# Patient Record
Sex: Female | Born: 1960 | Race: Black or African American | Hispanic: No | Smoking: Former smoker
Health system: Southern US, Community
[De-identification: ages and names within clinical notes are randomized; demographics above are authoritative.]

## PROBLEM LIST (undated history)

## (undated) DIAGNOSIS — K219 Gastro-esophageal reflux disease without esophagitis: Secondary | ICD-10-CM

## (undated) DIAGNOSIS — E7800 Pure hypercholesterolemia, unspecified: Secondary | ICD-10-CM

## (undated) DIAGNOSIS — J45909 Unspecified asthma, uncomplicated: Secondary | ICD-10-CM

## (undated) HISTORY — PX: APPENDECTOMY: SHX54

## (undated) HISTORY — PX: ABDOMINAL HYSTERECTOMY: SHX81

## (undated) HISTORY — PX: EYE SURGERY: SHX253

## (undated) HISTORY — PX: ANKLE SURGERY: SHX546

---

## 2013-01-22 ENCOUNTER — Encounter (HOSPITAL_COMMUNITY): Payer: Self-pay | Admitting: Emergency Medicine

## 2013-01-22 ENCOUNTER — Emergency Department (HOSPITAL_COMMUNITY)

## 2013-01-22 ENCOUNTER — Emergency Department (HOSPITAL_COMMUNITY)
Admission: EM | Admit: 2013-01-22 | Discharge: 2013-01-22 | Disposition: A | Discharge status: Home / Self Care | Attending: Emergency Medicine | Admitting: Emergency Medicine

## 2013-01-22 DIAGNOSIS — R110 Nausea: Secondary | ICD-10-CM | POA: Insufficient documentation

## 2013-01-22 DIAGNOSIS — IMO0002 Reserved for concepts with insufficient information to code with codable children: Secondary | ICD-10-CM | POA: Insufficient documentation

## 2013-01-22 DIAGNOSIS — J189 Pneumonia, unspecified organism: Secondary | ICD-10-CM

## 2013-01-22 DIAGNOSIS — Z87891 Personal history of nicotine dependence: Secondary | ICD-10-CM | POA: Insufficient documentation

## 2013-01-22 DIAGNOSIS — R5381 Other malaise: Secondary | ICD-10-CM | POA: Insufficient documentation

## 2013-01-22 DIAGNOSIS — Z880 Allergy status to penicillin: Secondary | ICD-10-CM | POA: Insufficient documentation

## 2013-01-22 DIAGNOSIS — R509 Fever, unspecified: Secondary | ICD-10-CM | POA: Insufficient documentation

## 2013-01-22 DIAGNOSIS — R55 Syncope and collapse: Secondary | ICD-10-CM

## 2013-01-22 DIAGNOSIS — Z79899 Other long term (current) drug therapy: Secondary | ICD-10-CM | POA: Insufficient documentation

## 2013-01-22 DIAGNOSIS — E7800 Pure hypercholesterolemia, unspecified: Secondary | ICD-10-CM | POA: Insufficient documentation

## 2013-01-22 DIAGNOSIS — J159 Unspecified bacterial pneumonia: Secondary | ICD-10-CM | POA: Insufficient documentation

## 2013-01-22 DIAGNOSIS — IMO0001 Reserved for inherently not codable concepts without codable children: Secondary | ICD-10-CM | POA: Insufficient documentation

## 2013-01-22 DIAGNOSIS — J45909 Unspecified asthma, uncomplicated: Secondary | ICD-10-CM | POA: Insufficient documentation

## 2013-01-22 HISTORY — DX: Pure hypercholesterolemia, unspecified: E78.00

## 2013-01-22 HISTORY — DX: Gastro-esophageal reflux disease without esophagitis: K21.9

## 2013-01-22 HISTORY — DX: Unspecified asthma, uncomplicated: J45.909

## 2013-01-22 LAB — CBC WITH DIFFERENTIAL/PLATELET
Basophils Absolute: 0 K/uL (ref 0.0–0.1)
Basophils Absolute: 0 K/uL (ref 0.0–0.1)
Basophils Relative: 0 % (ref 0–1)
Basophils Relative: 0 % (ref 0–1)
Eosinophils Absolute: 0 K/uL (ref 0.0–0.7)
Eosinophils Absolute: 0 10*3/uL (ref 0.0–0.7)
Eosinophils Relative: 0 % (ref 0–5)
Eosinophils Relative: 0 % (ref 0–5)
HCT: 40.2 % (ref 36.0–46.0)
HCT: 34.9 % — ABNORMAL LOW (ref 36.0–46.0)
Hemoglobin: 14 g/dL (ref 12.0–15.0)
Hemoglobin: 12.2 g/dL (ref 12.0–15.0)
Lymphocytes Relative: 5 % — ABNORMAL LOW (ref 12–46)
Lymphocytes Relative: 4 % — ABNORMAL LOW (ref 12–46)
Lymphs Abs: 0.9 10*3/uL (ref 0.7–4.0)
Lymphs Abs: 0.7 10*3/uL (ref 0.7–4.0)
MCH: 30.7 pg (ref 26.0–34.0)
MCH: 30.3 pg (ref 26.0–34.0)
MCHC: 34.8 g/dL (ref 30.0–36.0)
MCHC: 35 g/dL (ref 30.0–36.0)
MCV: 88.2 fL (ref 78.0–100.0)
MCV: 86.6 fL (ref 78.0–100.0)
Monocytes Absolute: 0.9 10*3/uL (ref 0.1–1.0)
Monocytes Absolute: 1.4 10*3/uL — ABNORMAL HIGH (ref 0.1–1.0)
Monocytes Relative: 5 % (ref 3–12)
Monocytes Relative: 8 % (ref 3–12)
Neutro Abs: 16.1 10*3/uL — ABNORMAL HIGH (ref 1.7–7.7)
Neutro Abs: 15.7 K/uL — ABNORMAL HIGH (ref 1.7–7.7)
Neutrophils Relative %: 90 % — ABNORMAL HIGH (ref 43–77)
Neutrophils Relative %: 88 % — ABNORMAL HIGH (ref 43–77)
Platelets: 245 10*3/uL (ref 150–400)
Platelets: 198 K/uL (ref 150–400)
RBC: 4.56 MIL/uL (ref 3.87–5.11)
RBC: 4.03 MIL/uL (ref 3.87–5.11)
RDW: 12.1 % (ref 11.5–15.5)
RDW: 12 % (ref 11.5–15.5)
WBC Morphology: INCREASED
WBC: 17.9 10*3/uL — ABNORMAL HIGH (ref 4.0–10.5)
WBC: 17.8 10*3/uL — ABNORMAL HIGH (ref 4.0–10.5)

## 2013-01-22 LAB — CG4 I-STAT (LACTIC ACID): Lactic Acid, Venous: 1.82 mmol/L (ref 0.5–2.2)

## 2013-01-22 LAB — URINALYSIS, ROUTINE W REFLEX MICROSCOPIC
Bilirubin Urine: NEGATIVE
Glucose, UA: NEGATIVE mg/dL
Hgb urine dipstick: NEGATIVE
Ketones, ur: 15 mg/dL — AB
Nitrite: NEGATIVE
Protein, ur: NEGATIVE mg/dL
Specific Gravity, Urine: 1.011 (ref 1.005–1.030)
Urobilinogen, UA: 1 mg/dL (ref 0.0–1.0)
pH: 6.5 (ref 5.0–8.0)

## 2013-01-22 LAB — BASIC METABOLIC PANEL
BUN: 14 mg/dL (ref 6–23)
CO2: 25 mEq/L (ref 19–32)
Calcium: 9.2 mg/dL (ref 8.4–10.5)
Chloride: 94 mEq/L — ABNORMAL LOW (ref 96–112)
Creatinine, Ser: 0.88 mg/dL (ref 0.50–1.10)
Sodium: 131 mEq/L — ABNORMAL LOW (ref 135–145)

## 2013-01-22 LAB — BASIC METABOLIC PANEL WITH GFR
GFR calc Af Amer: 85 mL/min — ABNORMAL LOW (ref >90)
GFR calc non Af Amer: 73 mL/min — ABNORMAL LOW (ref >90)
Glucose, Bld: 160 mg/dL — ABNORMAL HIGH (ref 70–99)
Potassium: 3.2 meq/L — ABNORMAL LOW (ref 3.5–5.1)

## 2013-01-22 LAB — URINE MICROSCOPIC-ADD ON

## 2013-01-22 MED ORDER — ALBUTEROL SULFATE (5 MG/ML) 0.5% IN NEBU
5.0000 mg | INHALATION_SOLUTION | Freq: Once | RESPIRATORY_TRACT | Status: AC
  Administered 2013-01-22: 5 mg via RESPIRATORY_TRACT
  Filled 2013-01-22: qty 1

## 2013-01-22 MED ORDER — LEVOFLOXACIN IN D5W 750 MG/150ML IV SOLN
750.0000 mg | Freq: Once | INTRAVENOUS | Status: AC
  Administered 2013-01-22: 750 mg via INTRAVENOUS
  Filled 2013-01-22: qty 150

## 2013-01-22 MED ORDER — ONDANSETRON HCL 4 MG/2ML IJ SOLN
4.0000 mg | Freq: Once | INTRAMUSCULAR | Status: AC
  Administered 2013-01-22: 4 mg via INTRAVENOUS
  Filled 2013-01-22: qty 2

## 2013-01-22 MED ORDER — ONDANSETRON HCL 4 MG PO TABS: 4.0000 mg | ORAL_TABLET | Freq: Three times a day (TID) | ORAL | Status: DC | PRN

## 2013-01-22 MED ORDER — KETOROLAC TROMETHAMINE 30 MG/ML IJ SOLN
30.0000 mg | Freq: Once | INTRAMUSCULAR | Status: AC
  Administered 2013-01-22: 30 mg via INTRAVENOUS
  Filled 2013-01-22: qty 1

## 2013-01-22 MED ORDER — IBUPROFEN 200 MG PO TABS: 600.0000 mg | ORAL_TABLET | Freq: Once | ORAL | Status: DC

## 2013-01-22 MED ORDER — SODIUM CHLORIDE 0.9 % IV BOLUS (SEPSIS)
1000.0000 mL | Freq: Once | INTRAVENOUS | Status: AC
  Administered 2013-01-22: 1000 mL via INTRAVENOUS

## 2013-01-22 MED ORDER — LEVOFLOXACIN 500 MG PO TABS: 500.0000 mg | ORAL_TABLET | Freq: Every day | ORAL | Status: DC

## 2013-01-22 MED ORDER — IPRATROPIUM BROMIDE 0.02 % IN SOLN
0.5000 mg | Freq: Once | RESPIRATORY_TRACT | Status: AC
  Administered 2013-01-22: 0.5 mg via RESPIRATORY_TRACT
  Filled 2013-01-22: qty 2.5

## 2013-01-22 MED ORDER — ALBUTEROL SULFATE HFA 108 (90 BASE) MCG/ACT IN AERS: 1.0000 | INHALATION_SPRAY | Freq: Four times a day (QID) | RESPIRATORY_TRACT | Status: DC | PRN

## 2013-01-22 NOTE — ED Notes (Signed)
Pt states she has had a fever the past two days.

## 2013-01-22 NOTE — ED Notes (Signed)
Pt from home, c/o generalized weakness,fever and syncope. Pt states she was traveling yesterday and passed out in car, states she woke up incontinent. Pt blacked out at triage. Pt alert and oriented. NSD

## 2013-01-22 NOTE — ED Provider Notes (Signed)
CSN: [id number]     Arrival date & time 01/22/13  1834 History   First MD Initiated Contact with Patient 01/22/13 1852     Chief Complaint  Patient presents with  . 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  Pt was in car with son driving when syncope occurred for second time today.  Pt felt weak, no CP, HA, back pain.  Did have some abdominal cramping lower, that has resolved.  Pt also was incontinent of urine which family was concerned about.    Patient is a 52 y.o. female presenting with syncope and cough. The history is provided by the patient and a relative.  Loss of Consciousness Episode history:  Multiple Most recent episode:  Today Duration:  5 minutes Progression:  Waxing and waning Chronicity:  New Context: sitting down   Context comment:  URI, nausea, coughing, fever Witnessed: yes   Relieved by:  Lying down Worsened by:  Nothing tried Ineffective treatments:  None tried Associated symptoms: fever, nausea and weakness   Associated symptoms: no chest pain, no difficulty breathing, no dizziness, no headaches, no rectal bleeding and no vomiting   Fever:    Duration:  2 days   Timing:  Constant   Temp source:  Subjective and oral   Progression:  Unchanged Nausea:    Severity:  Mild   Onset quality:  Gradual   Duration:  1 day   Progression:  Waxing and waning Risk factors: no coronary artery disease, no seizures and no vascular disease   Cough Cough characteristics:  Productive Sputum characteristics:  Green Severity:  Moderate Duration:  3 days Timing:  Constant Progression:  Unchanged Chronicity:  New Smoker: no   Context: sick contacts   Ineffective treatments:  Beta-agonist inhaler Associated symptoms: fever and myalgias   Associated symptoms: no chest pain, no headaches  and no rash     Past Medical History  Diagnosis Date  . High cholesterol   . Asthma   . GERD (gastroesophageal reflux disease)    Past Surgical History  Procedure Laterality Date  . Eye surgery    . Abdominal hysterectomy    . Appendectomy     History reviewed. No pertinent family history. History  Substance Use Topics  . Smoking status: Former Games developer  . Smokeless tobacco: Not on file  . Alcohol Use: No   OB History   Grav Para Term Preterm Abortions TAB SAB Ect Mult Living                 Review of Systems  Constitutional: Positive for fever, appetite change and fatigue.  Respiratory: Positive for cough.   Cardiovascular: Positive for syncope. Negative for chest pain.  Gastrointestinal: Positive for nausea. Negative for vomiting, diarrhea, constipation and blood in stool.  Musculoskeletal: Positive for myalgias.  Skin: Negative for rash.  Neurological: Positive for syncope and weakness. Negative for dizziness and headaches.  All other systems reviewed and are negative.    Allergies  Ampicillin; Ceclor; Tetracyclines & related; and Tylenol with codeine #3  Home Medications   Current Outpatient Rx  Name  Route  Sig  Dispense  Refill  . albuterol (PROVENTIL HFA;VENTOLIN HFA) 108 (90 BASE) MCG/ACT inhaler   Inhalation   Inhale 2 puffs into the lungs every 6 (six) hours as needed for wheezing or shortness of breath.         Marland Kitchen  atorvastatin (LIPITOR) 10 MG tablet   Oral   Take 10 mg by mouth daily.         . cetirizine (ZYRTEC) 10 MG tablet   Oral   Take 10 mg by mouth daily.         . cyclobenzaprine (FLEXERIL) 5 MG tablet   Oral   Take 5 mg by mouth 3 (three) times daily as needed for muscle spasms.         Marland Kitchen esomeprazole (NEXIUM) 40 MG capsule   Oral   Take 40 mg by mouth daily at 12 noon.         . fluticasone (FLONASE) 50 MCG/ACT nasal spray   Each Nare   Place 1 spray into both nostrils daily.         . Fluticasone-Salmeterol (ADVAIR)  250-50 MCG/DOSE AEPB   Inhalation   Inhale 1 puff into the lungs 2 (two) times daily.         . Multiple Vitamins-Minerals (MULTIVITAMIN WITH MINERALS) tablet   Oral   Take 1 tablet by mouth daily.         . vitamin E 200 UNIT capsule   Oral   Take 200 Units by mouth daily.         Marland Kitchen albuterol (PROVENTIL HFA;VENTOLIN HFA) 108 (90 BASE) MCG/ACT inhaler   Inhalation   Inhale 1-2 puffs into the lungs every 6 (six) hours as needed for wheezing or shortness of breath.   1 Inhaler   0   . levofloxacin (LEVAQUIN) 500 MG tablet   Oral   Take 1 tablet (500 mg total) by mouth daily.   10 tablet   0    BP 103/60  Pulse 136  Temp(Src) 98.6 F (37 C) (Oral)  Resp 22  Wt 111 lb 5 oz (50.491 kg)  SpO2 98% Physical Exam  Nursing note and vitals reviewed. Constitutional: She appears well-developed and well-nourished. No distress.  HENT:  Head: Normocephalic and atraumatic.  Eyes: Conjunctivae and EOM are normal. No scleral icterus.  Neck: Normal range of motion. Neck supple.  Cardiovascular: Regular rhythm and intact distal pulses.  Tachycardia present.   No murmur heard. Pulmonary/Chest: Effort normal. No respiratory distress. She has no wheezes. She has no rales.  Abdominal: Soft. She exhibits no distension. There is no tenderness. There is no rebound and no guarding.  Neurological: She is alert.  Skin: Skin is warm and dry. No rash noted. She is not diaphoretic.  Psychiatric: She has a normal mood and affect.    ED Course  Procedures (including critical care time) Labs Review Labs Reviewed  CBC WITH DIFFERENTIAL - Abnormal; Notable for the following:    WBC 17.9 (*)    Neutrophils Relative % 90 (*)    Lymphocytes Relative 5 (*)    Neutro Abs 16.1 (*)    All other components within normal limits  BASIC METABOLIC PANEL - Abnormal; Notable for the following:    Sodium 131 (*)    Potassium 3.2 (*)    Chloride 94 (*)    Glucose, Bld 160 (*)    GFR calc non Af Amer 73  (*)    GFR calc Af Amer 85 (*)    All other components within normal limits  URINALYSIS, ROUTINE W REFLEX MICROSCOPIC - Abnormal; Notable for the following:    APPearance HAZY (*)    Ketones, ur 15 (*)    Leukocytes, UA LARGE (*)    All other components within normal  limits  CBC WITH DIFFERENTIAL - Abnormal; Notable for the following:    WBC 17.8 (*)    HCT 34.9 (*)    Neutrophils Relative % 88 (*)    Neutro Abs 15.7 (*)    Lymphocytes Relative 4 (*)    Monocytes Absolute 1.4 (*)    All other components within normal limits  URINE MICROSCOPIC-ADD ON - Abnormal; Notable for the following:    Squamous Epithelial / LPF MANY (*)    Bacteria, UA FEW (*)    All other components within normal limits  URINE CULTURE  CG4 I-STAT (LACTIC ACID)   Imaging Review Dg Chest 2 View  01/22/2013   CLINICAL DATA:  Cough, fever, chest congestion  EXAM: CHEST  2 VIEW  COMPARISON:  None.  FINDINGS: Focal right upper lobe opacity, suspicious for pneumonia. No pleural effusion or pneumothorax.  The heart is normal in size.  Visualized osseous structures are within normal limits.  IMPRESSION: Focal right upper lobe opacity, suspicious for pneumonia.  Follow-up chest radiographs are suggested to document resolution.   Electronically Signed   By: Charline Bills M.D.   On: 01/22/2013 19:58    EKG Interpretation    Date/Time:  Friday January 22 2013 18:50:21 EST Ventricular Rate:  106 PR Interval:  183 QRS Duration: 72 QT Interval:  303 QTC Calculation: 402 R Axis:   74 Text Interpretation:  Sinus tachycardia Nonspecific repol abnormality, diffuse leads Borderline ECG No previous tracing Confirmed by [facility]  MD, MICHEAL ([id number]) on 01/22/2013 7:17:44 PM           ra sat is 99% and I interpret to be normal  8:20 PM Mild electrolytes derangements, likely due to dehydration.     8:30 PM I reviewed CXR and radiologist interpretation, agree that there is a RUL opacity, consistent with CAP.  Given  pt has had 1 dose of azithromycin, but no sig improvement, in fact seems worse, will give IV dose of levaquin, give IVF's and monitor carefully as pt may require admission.      10:40 PM Pt feels improved overall.  Pt given optino for admission given continued sinus tachycardia, although improved.  Pt has help at home, resources with family, pt prefers to go home.  Pt has inhaler at [facility] as well.  Will change abx to   MDM   1. Community acquired pneumonia   2. Syncope      Pt with ILI with fever, achiness, coughing, weakness.  Pt has not been vaccinated and at risk given contacts with multiple children.  Will get CXR, fever work up including lactic acid.  Otherwise will treat fever, give IVF's and continue to monitor.  Pt has no cardiovascular risks, no prior h/o seizures.  Pt is mildly orthostatic with drop in systolic and rise in HR with standing.      Gavin Pound. Danniell Rotundo, MD 01/22/13 2240

## 2013-01-22 NOTE — Discharge Instructions (Signed)
Pneumonia, Adult  Pneumonia is an infection of the lungs.   CAUSES  Pneumonia may be caused by bacteria or a virus. Usually, these infections are caused by breathing infectious particles into the lungs (respiratory tract).  SYMPTOMS    Cough.   Fever.   Chest pain.   Increased rate of breathing.   Wheezing.   Mucus production.  DIAGNOSIS   If you have the common symptoms of pneumonia, your caregiver will typically confirm the diagnosis with a chest X-ray. The X-ray will show an abnormality in the lung (pulmonary infiltrate) if you have pneumonia. Other tests of your blood, urine, or sputum may be done to find the specific cause of your pneumonia. Your caregiver may also do tests (blood gases or pulse oximetry) to see how well your lungs are working.  TREATMENT   Some forms of pneumonia may be spread to other people when you cough or sneeze. You may be asked to wear a mask before and during your exam. Pneumonia that is caused by bacteria is treated with antibiotic medicine. Pneumonia that is caused by the influenza virus may be treated with an antiviral medicine. Most other viral infections must run their course. These infections will not respond to antibiotics.   PREVENTION  A pneumococcal shot (vaccine) is available to prevent a common bacterial cause of pneumonia. This is usually suggested for:   People over 65 years old.   Patients on chemotherapy.   People with chronic lung problems, such as bronchitis or emphysema.   People with immune system problems.  If you are over 65 or have a high risk condition, you may receive the pneumococcal vaccine if you have not received it before. In some countries, a routine influenza vaccine is also recommended. This vaccine can help prevent some cases of pneumonia.You may be offered the influenza vaccine as part of your care.  If you smoke, it is time to quit. You may receive instructions on how to stop smoking. Your caregiver can provide medicines and counseling to  help you quit.  HOME CARE INSTRUCTIONS    Cough suppressants may be used if you are losing too much rest. However, coughing protects you by clearing your lungs. You should avoid using cough suppressants if you can.   Your caregiver may have prescribed medicine if he or she thinks your pneumonia is caused by a bacteria or influenza. Finish your medicine even if you start to feel better.   Your caregiver may also prescribe an expectorant. This loosens the mucus to be coughed up.   Only take over-the-counter or prescription medicines for pain, discomfort, or fever as directed by your caregiver.   Do not smoke. Smoking is a common cause of bronchitis and can contribute to pneumonia. If you are a smoker and continue to smoke, your cough may last several weeks after your pneumonia has cleared.   A cold steam vaporizer or humidifier in your room or home may help loosen mucus.   Coughing is often worse at night. Sleeping in a semi-upright position in a recliner or using a couple pillows under your head will help with this.   Get rest as you feel it is needed. Your body will usually let you know when you need to rest.  SEEK IMMEDIATE MEDICAL CARE IF:    Your illness becomes worse. This is especially true if you are elderly or weakened from any other disease.   You cannot control your cough with suppressants and are losing sleep.     You begin coughing up blood.   You develop pain which is getting worse or is uncontrolled with medicines.   You have a fever.   Any of the symptoms which initially brought you in for treatment are getting worse rather than better.   You develop shortness of breath or chest pain.  MAKE SURE YOU:    Understand these instructions.   Will watch your condition.   Will get help right away if you are not doing well or get worse.  Document Released: 02/11/2005 Document Revised: 05/06/2011 Document Reviewed: 05/03/2010  [facility] Patient Information 2014 [place]  [place]          Syncope  Syncope is a fainting spell. This means the person loses consciousness and drops to the ground. The person is generally unconscious for less than 5 minutes. The person may have some muscle twitches for up to 15 seconds before waking up and returning to normal. Syncope occurs more often in elderly people, but it can happen to anyone. While most causes of syncope are not dangerous, syncope can be a sign of a serious medical problem. It is important to seek medical care.   CAUSES   Syncope is caused by a sudden decrease in blood flow to the brain. The specific cause is often not determined. Factors that can trigger syncope include:   Taking medicines that lower blood pressure.   Sudden changes in posture, such as standing up suddenly.   Taking more medicine than prescribed.   Standing in one place for too long.   Seizure disorders.   Dehydration and excessive exposure to heat.   Low blood sugar (hypoglycemia).   Straining to have a bowel movement.   Heart disease, irregular heartbeat, or other circulatory problems.   Fear, emotional distress, seeing blood, or severe pain.  SYMPTOMS   Right before fainting, you may:   Feel dizzy or lightheaded.   Feel nauseous.   See all white or all black in your field of vision.   Have cold, clammy skin.  DIAGNOSIS   Your caregiver will ask about your symptoms, perform a physical exam, and perform electrocardiography (ECG) to record the electrical activity of your heart. Your caregiver may also perform other heart or blood tests to determine the cause of your syncope.  TREATMENT   In most cases, no treatment is needed. Depending on the cause of your syncope, your caregiver may recommend changing or stopping some of your medicines.  HOME CARE INSTRUCTIONS   Have someone stay with you until you feel stable.   Do not drive, operate machinery, or play sports until your caregiver says it is okay.   Keep all follow-up appointments as directed by your  caregiver.   Lie down right away if you start feeling like you might faint. Breathe deeply and steadily. Wait until all the symptoms have passed.   Drink enough fluids to keep your urine clear or pale yellow.   If you are taking blood pressure or heart medicine, get up slowly, taking several minutes to sit and then stand. This can reduce dizziness.  SEEK IMMEDIATE MEDICAL CARE IF:    You have a severe headache.   You have unusual pain in the chest, abdomen, or back.   You are bleeding from the mouth or rectum, or you have black or tarry stool.   You have an irregular or very fast heartbeat.   You have pain with breathing.   You have repeated fainting or seizure-like jerking during an  episode.   You faint when sitting or lying down.   You have confusion.   You have difficulty walking.   You have severe weakness.   You have vision problems.  If you fainted, call your local emergency services (911 in [place] Do not drive yourself to the hospital.   MAKE SURE YOU:   Understand these instructions.   Will watch your condition.   Will get help right away if you are not doing well or get worse.  Document Released: 02/11/2005 Document Revised: 08/13/2011 Document Reviewed: 04/12/2011  [facility] Patient Information 2014 [place].

## 2013-01-22 NOTE — ED Notes (Signed)
Charge RN informed of pt's labwork, lab initial informed this RN that they did not have the CBC with diff in the laboratory, labwork redrawn. Duplicate labwork resulted in pt's chart. Asked charge RN how to remove one input so the pt is not double charge. Charge RN said he would look into it.

## 2013-01-24 LAB — URINE CULTURE: Colony Count: 8000

## 2013-08-16 ENCOUNTER — Emergency Department (HOSPITAL_COMMUNITY)
Admission: EM | Admit: 2013-08-16 | Discharge: 2013-08-16 | Disposition: A | Discharge status: Home / Self Care | Attending: Emergency Medicine | Admitting: Emergency Medicine

## 2013-08-16 ENCOUNTER — Encounter (HOSPITAL_COMMUNITY): Payer: Self-pay | Admitting: Emergency Medicine

## 2013-08-16 ENCOUNTER — Emergency Department (HOSPITAL_COMMUNITY)

## 2013-08-16 DIAGNOSIS — IMO0002 Reserved for concepts with insufficient information to code with codable children: Secondary | ICD-10-CM | POA: Insufficient documentation

## 2013-08-16 DIAGNOSIS — Z3202 Encounter for pregnancy test, result negative: Secondary | ICD-10-CM | POA: Insufficient documentation

## 2013-08-16 DIAGNOSIS — R05 Cough: Secondary | ICD-10-CM

## 2013-08-16 DIAGNOSIS — R55 Syncope and collapse: Secondary | ICD-10-CM | POA: Insufficient documentation

## 2013-08-16 DIAGNOSIS — Z79899 Other long term (current) drug therapy: Secondary | ICD-10-CM | POA: Insufficient documentation

## 2013-08-16 DIAGNOSIS — Z880 Allergy status to penicillin: Secondary | ICD-10-CM | POA: Insufficient documentation

## 2013-08-16 DIAGNOSIS — J45909 Unspecified asthma, uncomplicated: Secondary | ICD-10-CM | POA: Insufficient documentation

## 2013-08-16 DIAGNOSIS — R059 Cough, unspecified: Secondary | ICD-10-CM

## 2013-08-16 DIAGNOSIS — E7800 Pure hypercholesterolemia, unspecified: Secondary | ICD-10-CM | POA: Insufficient documentation

## 2013-08-16 DIAGNOSIS — K219 Gastro-esophageal reflux disease without esophagitis: Secondary | ICD-10-CM | POA: Insufficient documentation

## 2013-08-16 LAB — URINALYSIS, ROUTINE W REFLEX MICROSCOPIC
Bilirubin Urine: NEGATIVE
Glucose, UA: NEGATIVE mg/dL
KETONES UR: 15 mg/dL — AB
Nitrite: NEGATIVE
PH: 7.5 (ref 5.0–8.0)
Protein, ur: NEGATIVE mg/dL
SPECIFIC GRAVITY, URINE: 1.006 (ref 1.005–1.030)
Urobilinogen, UA: 1 mg/dL (ref 0.0–1.0)

## 2013-08-16 LAB — COMPREHENSIVE METABOLIC PANEL
ALBUMIN: 4.1 g/dL (ref 3.5–5.2)
ALT: 19 U/L (ref 0–35)
AST: 24 U/L (ref 0–37)
Alkaline Phosphatase: 84 U/L (ref 39–117)
BUN: 9 mg/dL (ref 6–23)
CHLORIDE: 96 meq/L (ref 96–112)
CO2: 24 mEq/L (ref 19–32)
Calcium: 9.7 mg/dL (ref 8.4–10.5)
Creatinine, Ser: 0.66 mg/dL (ref 0.50–1.10)
GFR calc non Af Amer: >90 mL/min (ref >90)
GLUCOSE: 123 mg/dL — AB (ref 70–99)
Potassium: 3.5 mEq/L — ABNORMAL LOW (ref 3.7–5.3)
Sodium: 135 mEq/L — ABNORMAL LOW (ref 137–147)
TOTAL PROTEIN: 7.1 g/dL (ref 6.0–8.3)
Total Bilirubin: 0.7 mg/dL (ref 0.3–1.2)

## 2013-08-16 LAB — CBC WITH DIFFERENTIAL/PLATELET
Basophils Absolute: 0 10*3/uL (ref 0.0–0.1)
Basophils Relative: 0 % (ref 0–1)
EOS ABS: 0 10*3/uL (ref 0.0–0.7)
Eosinophils Relative: 0 % (ref 0–5)
HCT: 35.6 % — ABNORMAL LOW (ref 36.0–46.0)
HEMOGLOBIN: 12 g/dL (ref 12.0–15.0)
LYMPHS ABS: 0.8 10*3/uL (ref 0.7–4.0)
Lymphocytes Relative: 8 % — ABNORMAL LOW (ref 12–46)
MCH: 28.9 pg (ref 26.0–34.0)
MCHC: 33.7 g/dL (ref 30.0–36.0)
MCV: 85.8 fL (ref 78.0–100.0)
MONOS PCT: 7 % (ref 3–12)
Monocytes Absolute: 0.7 10*3/uL (ref 0.1–1.0)
NEUTROS ABS: 8.3 10*3/uL — AB (ref 1.7–7.7)
Neutrophils Relative %: 85 % — ABNORMAL HIGH (ref 43–77)
PLATELETS: 248 10*3/uL (ref 150–400)
RBC: 4.15 MIL/uL (ref 3.87–5.11)
RDW: 12.2 % (ref 11.5–15.5)
WBC: 9.8 10*3/uL (ref 4.0–10.5)

## 2013-08-16 LAB — CBG MONITORING, ED: Glucose-Capillary: 129 mg/dL — ABNORMAL HIGH (ref 70–99)

## 2013-08-16 LAB — URINE MICROSCOPIC-ADD ON

## 2013-08-16 LAB — TROPONIN I

## 2013-08-16 MED ORDER — LEVOFLOXACIN 500 MG PO TABS: 500.0000 mg | ORAL_TABLET | Freq: Every day | ORAL | Status: AC

## 2013-08-16 MED ORDER — LEVOFLOXACIN 500 MG PO TABS
500.0000 mg | ORAL_TABLET | Freq: Once | ORAL | Status: AC
  Administered 2013-08-16: 500 mg via ORAL
  Filled 2013-08-16: qty 1

## 2013-08-16 MED ORDER — SODIUM CHLORIDE 0.9 % IV SOLN
1000.0000 mL | INTRAVENOUS | Status: DC
  Administered 2013-08-16: 1000 mL via INTRAVENOUS

## 2013-08-16 NOTE — ED Notes (Signed)
Main lab staff called back and stated urine sample located. Currently processing.

## 2013-08-16 NOTE — Discharge Instructions (Signed)
As discussed, with your cough, fever, and congestion, there is some concern for ongoing URI and/or early pneumonia.  You will be started on antibiotics.  Please take all medication as directed.  Additionally, with your episodes of losing consciousness, while you have respiratory infection, it is important to follow up with our cardiologists as well.  Be sure to return here for any concerning changes in your condition.

## 2013-08-16 NOTE — ED Provider Notes (Signed)
CSN: [id number]     Arrival date & time 08/16/13  1829 History   First MD Initiated Contact with Patient 08/16/13 1837     Chief Complaint  Patient presents with  . Loss of Consciousness     (Consider location/radiation/quality/duration/timing/severity/associated sxs/prior Treatment) HPI Patient presents today after an episode of losing consciousness. Patient notes that approximately one week ago she began having URI like symptoms, with productive cough, chest tightness, fevers, chills. Symptoms have been persistent, and she went to urgent care today. There she was found to have a syncopal episode. Patient notes upon awakening she continued to have the same chest discomfort, no new confusion, disorientation, nausea, vomiting. Patient's history of multiple prior syncopal events, typically with URI illnesses. Patient does not smoke, does not drink, is generally well.  Past Medical History  Diagnosis Date  . High cholesterol   . Asthma   . GERD (gastroesophageal reflux disease)    Past Surgical History  Procedure Laterality Date  . Eye surgery    . Abdominal hysterectomy    . Appendectomy    . Ankle surgery     No family history on file. History  Substance Use Topics  . Smoking status: Former Games developer  . Smokeless tobacco: Not on file  . Alcohol Use: No   OB History   Grav Para Term Preterm Abortions TAB SAB Ect Mult Living                 Review of Systems  Constitutional:       Per HPI, otherwise negative  HENT:       Per HPI, otherwise negative  Respiratory:       Per HPI, otherwise negative  Cardiovascular:       Per HPI, otherwise negative  Gastrointestinal: Negative for vomiting.  Endocrine:       Negative aside from HPI  Genitourinary:       Neg aside from HPI   Musculoskeletal:       Per HPI, otherwise negative  Skin: Negative.   Neurological: Positive for syncope.      Allergies  Ampicillin; Ceclor; Sulfa antibiotics; Tape; Tetracyclines &  related; and Tylenol with codeine #3  Home Medications   Prior to Admission medications   Medication Sig Start Date End Date Taking? Authorizing Provider  Acetaminophen (TYLENOL PO) Take 2 tablets by mouth daily as needed (for headache).   Yes Historical Provider, MD  albuterol (PROVENTIL HFA;VENTOLIN HFA) 108 (90 BASE) MCG/ACT inhaler Inhale 2 puffs into the lungs every 6 (six) hours as needed for wheezing or shortness of breath.   Yes Historical Provider, MD  Ascorbic Acid (VITAMIN C GUMMIE PO) Take 2 tablets by mouth daily.   Yes Historical Provider, MD  atorvastatin (LIPITOR) 10 MG tablet Take 10 mg by mouth daily at 6 PM.    Yes Historical Provider, MD  cetirizine (ZYRTEC) 10 MG tablet Take 10 mg by mouth at bedtime.    Yes Historical Provider, MD  Cholecalciferol (VITAMIN D) 1000 UNITS capsule Take 1,000 Units by mouth daily.   Yes Historical Provider, MD  cyclobenzaprine (FLEXERIL) 5 MG tablet Take 5 mg by mouth 3 (three) times daily as needed for muscle spasms.   Yes Historical Provider, MD  esomeprazole (NEXIUM) 40 MG capsule Take 40 mg by mouth daily at 12 noon.   Yes Historical Provider, MD  fluticasone (FLONASE) 50 MCG/ACT nasal spray Place 1 spray into both nostrils daily.   Yes Historical Provider, MD  Fluticasone-Salmeterol (ADVAIR)  250-50 MCG/DOSE AEPB Inhale 1 puff into the lungs 2 (two) times daily.   Yes Historical Provider, MD  hydrocortisone valerate cream (WESTCORT) 0.2 % Apply 1 application topically 2 (two) times daily as needed (for dry skin).   Yes Historical Provider, MD  Multiple Vitamins-Minerals (MULTIVITAMIN WITH MINERALS) tablet Take 1 tablet by mouth daily.   Yes Historical Provider, MD  vitamin E 200 UNIT capsule Take 200 Units by mouth daily.   Yes Historical Provider, MD   BP 110/67  Pulse 96  Temp(Src) 100.3 F (37.9 C) (Oral)  Resp 23  Ht 4\' 9"  (1.448 m)  Wt 103 lb (46.72 kg)  BMI 22.28 kg/m2  SpO2 99% Physical Exam  Nursing note and vitals  reviewed. Constitutional: She is oriented to person, place, and time. She appears well-developed and well-nourished. No distress.  HENT:  Head: Normocephalic and atraumatic.  Eyes: Conjunctivae and EOM are normal.  Cardiovascular: Normal rate and regular rhythm.   Pulmonary/Chest: Effort normal and breath sounds normal. No stridor. No respiratory distress.  Abdominal: She exhibits no distension.  Musculoskeletal: She exhibits no edema.  Neurological: She is alert and oriented to person, place, and time. No cranial nerve deficit.  Skin: Skin is warm and dry.  Psychiatric: She has a normal mood and affect.    ED Course  Procedures (including critical care time) Labs Review Labs Reviewed  CBC WITH DIFFERENTIAL - Abnormal; Notable for the following:    HCT 35.6 (*)    Neutrophils Relative % 85 (*)    Neutro Abs 8.3 (*)    Lymphocytes Relative 8 (*)    All other components within normal limits  COMPREHENSIVE METABOLIC PANEL - Abnormal; Notable for the following:    Sodium 135 (*)    Potassium 3.5 (*)    Glucose, Bld 123 (*)    All other components within normal limits  CBG MONITORING, ED - Abnormal; Notable for the following:    Glucose-Capillary 129 (*)    All other components within normal limits  TROPONIN I  URINALYSIS, ROUTINE W REFLEX MICROSCOPIC  POCT CBG (FASTING - GLUCOSE)-MANUAL ENTRY    Imaging Review Dg Chest 2 View  08/16/2013   CLINICAL DATA:  Chest tightness.  EXAM: CHEST  2 VIEW  COMPARISON:  01/22/2013  FINDINGS: Right-sided pneumonia seen on the previous study has resolved completely. No edema or focal airspace consolidation. 1 cm nodule projecting over the lower left lung is consistent with a nipple shadow. This nodular density was seen more laterally and inferiorly in the left lung on the previous study with differential breast position. The cardiopericardial silhouette is within normal limits for size. Imaged bony structures of the thorax are intact.  IMPRESSION:  No acute cardiopulmonary findings.  10 mm nodule over the left lower lung compatible with nipple shadow.   Electronically Signed   By: Kennith CenterEric  Mansell M.D.   On: 08/16/2013 20:20   ECG: ECG 98 SR, nml  O 2- 99%ra, nml   11:12 PM Patient name in no distress.  Discussed all findings.  Given the patient's fever, cough, she will be treated empirically for respiratory infection.  In addition, given her recurrent episodes of syncope, typically with a URI episodes, she'll follow up with cardiology for further evaluation and management.  When she does state that after a prior syncope, she followed up with cardiology had testing done, this was in another region.   MDM   Final diagnoses:  Syncope and collapse  Cough  Patient presents episode of syncope, during an ongoing episode of respiratory illness. On exam patient is awake and alert, mildly febrile, tachycardic.  The patient's understanding was largely reassuring, with no arrhythmia, no evidence of sepsis, no evidence of PE (and minimal risk profile). Given her discomfort, she was started on antibiotics, will follow up with cardiology as well as primary care.     Gerhard Munchobert Tjuana Vickrey, MD 08/16/13 [phone]
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
## 2013-08-16 NOTE — ED Notes (Signed)
Called main lab to inquire about status of urine test. Lab currently unable to locate urine sample that was sent for analysis and states that tests were not marked as collected. Per Epic urine sample was marked as collected @ 2129. Sample was sent to lab at that time. Lab personnel indicates that they will look into the matter further, this RN provided phone number for contact.

## 2013-08-16 NOTE — ED Notes (Signed)
Pt received 324 mg ASA. NO nitro received.

## 2014-08-04 ENCOUNTER — Emergency Department (HOSPITAL_COMMUNITY)
Admission: EM | Admit: 2014-08-04 | Discharge: 2014-08-04 | Disposition: A | Discharge status: Home / Self Care | Payer: No Typology Code available for payment source | Attending: Emergency Medicine | Admitting: Emergency Medicine

## 2014-08-04 ENCOUNTER — Encounter (HOSPITAL_COMMUNITY): Payer: Self-pay | Admitting: Emergency Medicine

## 2014-08-04 DIAGNOSIS — J45909 Unspecified asthma, uncomplicated: Secondary | ICD-10-CM | POA: Diagnosis not present

## 2014-08-04 DIAGNOSIS — Y9389 Activity, other specified: Secondary | ICD-10-CM | POA: Diagnosis not present

## 2014-08-04 DIAGNOSIS — Z7951 Long term (current) use of inhaled steroids: Secondary | ICD-10-CM | POA: Diagnosis not present

## 2014-08-04 DIAGNOSIS — Y92410 Unspecified street and highway as the place of occurrence of the external cause: Secondary | ICD-10-CM | POA: Insufficient documentation

## 2014-08-04 DIAGNOSIS — S3992XA Unspecified injury of lower back, initial encounter: Secondary | ICD-10-CM | POA: Diagnosis present

## 2014-08-04 DIAGNOSIS — S39012A Strain of muscle, fascia and tendon of lower back, initial encounter: Secondary | ICD-10-CM | POA: Insufficient documentation

## 2014-08-04 DIAGNOSIS — Z87891 Personal history of nicotine dependence: Secondary | ICD-10-CM | POA: Insufficient documentation

## 2014-08-04 DIAGNOSIS — E780 Pure hypercholesterolemia: Secondary | ICD-10-CM | POA: Diagnosis not present

## 2014-08-04 DIAGNOSIS — Y998 Other external cause status: Secondary | ICD-10-CM | POA: Diagnosis not present

## 2014-08-04 DIAGNOSIS — Z79899 Other long term (current) drug therapy: Secondary | ICD-10-CM | POA: Diagnosis not present

## 2014-08-04 DIAGNOSIS — Z880 Allergy status to penicillin: Secondary | ICD-10-CM | POA: Diagnosis not present

## 2014-08-04 DIAGNOSIS — K219 Gastro-esophageal reflux disease without esophagitis: Secondary | ICD-10-CM | POA: Insufficient documentation

## 2014-08-04 MED ORDER — NAPROXEN 250 MG PO TABS
500.0000 mg | ORAL_TABLET | Freq: Once | ORAL | Status: AC
  Administered 2014-08-04: 500 mg via ORAL
  Filled 2014-08-04: qty 2

## 2014-08-04 MED ORDER — METHOCARBAMOL 500 MG PO TABS: 500.0000 mg | ORAL_TABLET | Freq: Two times a day (BID) | ORAL | Status: DC

## 2014-08-04 MED ORDER — NAPROXEN 500 MG PO TABS: 500.0000 mg | ORAL_TABLET | Freq: Two times a day (BID) | ORAL | Status: DC
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
## 2014-10-21 ENCOUNTER — Emergency Department (HOSPITAL_COMMUNITY)
Admission: EM | Admit: 2014-10-21 | Discharge: 2014-10-21 | Disposition: A | Discharge status: Home / Self Care | Attending: Emergency Medicine | Admitting: Emergency Medicine

## 2014-10-21 ENCOUNTER — Emergency Department (HOSPITAL_COMMUNITY)

## 2014-10-21 ENCOUNTER — Encounter (HOSPITAL_COMMUNITY): Payer: Self-pay | Admitting: Emergency Medicine

## 2014-10-21 DIAGNOSIS — J45901 Unspecified asthma with (acute) exacerbation: Secondary | ICD-10-CM | POA: Insufficient documentation

## 2014-10-21 DIAGNOSIS — Z87891 Personal history of nicotine dependence: Secondary | ICD-10-CM | POA: Diagnosis not present

## 2014-10-21 DIAGNOSIS — E780 Pure hypercholesterolemia: Secondary | ICD-10-CM | POA: Insufficient documentation

## 2014-10-21 DIAGNOSIS — Z791 Long term (current) use of non-steroidal anti-inflammatories (NSAID): Secondary | ICD-10-CM | POA: Diagnosis not present

## 2014-10-21 DIAGNOSIS — K219 Gastro-esophageal reflux disease without esophagitis: Secondary | ICD-10-CM | POA: Insufficient documentation

## 2014-10-21 DIAGNOSIS — J40 Bronchitis, not specified as acute or chronic: Secondary | ICD-10-CM

## 2014-10-21 DIAGNOSIS — Z7951 Long term (current) use of inhaled steroids: Secondary | ICD-10-CM | POA: Diagnosis not present

## 2014-10-21 DIAGNOSIS — R0602 Shortness of breath: Secondary | ICD-10-CM | POA: Diagnosis present

## 2014-10-21 DIAGNOSIS — Z79899 Other long term (current) drug therapy: Secondary | ICD-10-CM | POA: Insufficient documentation

## 2014-10-21 MED ORDER — GUAIFENESIN ER 1200 MG PO TB12: 1.0000 | ORAL_TABLET | Freq: Two times a day (BID) | ORAL | Status: DC

## 2014-10-21 MED ORDER — PREDNISONE 50 MG PO TABS: 50.0000 mg | ORAL_TABLET | Freq: Every day | ORAL | Status: DC

## 2014-10-21 MED ORDER — PROMETHAZINE-DM 6.25-15 MG/5ML PO SYRP: 5.0000 mL | ORAL_SOLUTION | Freq: Four times a day (QID) | ORAL | Status: DC | PRN

## 2014-10-21 MED ORDER — IPRATROPIUM-ALBUTEROL 0.5-2.5 (3) MG/3ML IN SOLN
3.0000 mL | Freq: Once | RESPIRATORY_TRACT | Status: AC
  Administered 2014-10-21: 3 mL via RESPIRATORY_TRACT
  Filled 2014-10-21: qty 3

## 2014-10-21 NOTE — ED Provider Notes (Signed)
CSN: [id number]     Arrival date & time 10/21/14  2038 History  This chart was scribed for non-physician practitioner Ebbie Ridge, PA-C working with Doug Sou, MD by Lyndel Safe, ED Scribe. This patient was seen in room TR06C/TR06C and the patient's care was started at 8:58 PM.   Chief Complaint  Patient presents with  . Cough   The history is provided by the patient. No language interpreter was used.   HPI Comments: Sally Buck is a 54 y.o. female, with a PMhx of asthma, who presents to the Emergency Department complaining of a gradually worsening, constant, moderate cough that is productive with green sputum onset this morning. She reports associated chest congestion, SOB, and chest pain upon deep breathing onset today. She has used her albuterol inhaler with no relief. Denies fevers, or chills.   Past Medical History  Diagnosis Date  . High cholesterol   . Asthma   . GERD (gastroesophageal reflux disease)    Past Surgical History  Procedure Laterality Date  . Eye surgery    . Abdominal hysterectomy    . Appendectomy    . Ankle surgery     No family history on file. Social History  Substance Use Topics  . Smoking status: Former Games developer  . Smokeless tobacco: None  . Alcohol Use: No   OB History    No data available     Review of Systems  Constitutional: Negative for fever and chills.  HENT: Positive for congestion.   Respiratory: Positive for cough and shortness of breath.   All other systems reviewed and are negative.  Allergies  Ampicillin; Ceclor; Sulfa antibiotics; Tape; Tetracyclines & related; and Tylenol with codeine #3  Home Medications   Prior to Admission medications   Medication Sig Start Date End Date Taking? Authorizing Provider  Acetaminophen (TYLENOL PO) Take 2 tablets by mouth daily as needed (for headache).    Historical Provider, MD  albuterol (PROVENTIL HFA;VENTOLIN HFA) 108 (90 BASE) MCG/ACT inhaler Inhale 2 puffs into the lungs  every 6 (six) hours as needed for wheezing or shortness of breath.    Historical Provider, MD  Ascorbic Acid (VITAMIN C GUMMIE PO) Take 2 tablets by mouth daily.    Historical Provider, MD  atorvastatin (LIPITOR) 10 MG tablet Take 10 mg by mouth daily at 6 PM.     Historical Provider, MD  cetirizine (ZYRTEC) 10 MG tablet Take 10 mg by mouth at bedtime.     Historical Provider, MD  Cholecalciferol (VITAMIN D) 1000 UNITS capsule Take 1,000 Units by mouth daily.    Historical Provider, MD  cyclobenzaprine (FLEXERIL) 5 MG tablet Take 5 mg by mouth 3 (three) times daily as needed for muscle spasms.    Historical Provider, MD  esomeprazole (NEXIUM) 40 MG capsule Take 40 mg by mouth daily at 12 noon.    Historical Provider, MD  fluticasone (FLONASE) 50 MCG/ACT nasal spray Place 1 spray into both nostrils daily.    Historical Provider, MD  Fluticasone-Salmeterol (ADVAIR) 250-50 MCG/DOSE AEPB Inhale 1 puff into the lungs 2 (two) times daily.    Historical Provider, MD  hydrocortisone valerate cream (WESTCORT) 0.2 % Apply 1 application topically 2 (two) times daily as needed (for dry skin).    Historical Provider, MD  methocarbamol (ROBAXIN) 500 MG tablet Take 1 tablet (500 mg total) by mouth 2 (two) times daily. 08/04/14   Antony Madura, PA-C  Multiple Vitamins-Minerals (MULTIVITAMIN WITH MINERALS) tablet Take 1 tablet by mouth daily.  Historical Provider, MD  naproxen (NAPROSYN) 500 MG tablet Take 1 tablet (500 mg total) by mouth 2 (two) times daily. 08/04/14   Antony Madura, PA-C  vitamin E 200 UNIT capsule Take 200 Units by mouth daily.    Historical Provider, MD   BP 127/72 mmHg  Pulse 73  Temp(Src) 98 F (36.7 C) (Oral)  Resp 14  Ht  (1.448 m)  Wt 93 lb 2 oz (42.241 kg)  BMI 20.15 kg/m2  SpO2 100% Physical Exam  Constitutional: She is oriented to person, place, and time. She appears well-developed and well-nourished. No distress.  HENT:  Head: Normocephalic and atraumatic.  Mouth/Throat:  Oropharynx is clear and moist.  Eyes: Pupils are equal, round, and reactive to light.  Neck: Normal range of motion. Neck supple.  Cardiovascular: Normal rate, regular rhythm and normal heart sounds.  Exam reveals no gallop and no friction rub.   No murmur heard. Pulmonary/Chest: Effort normal and breath sounds normal. No respiratory distress. She has no wheezes.  Musculoskeletal: She exhibits no edema.  Neurological: She is alert and oriented to person, place, and time. She exhibits normal muscle tone. Coordination normal.  Skin: Skin is warm and dry. No rash noted. No erythema.  Nursing note and vitals reviewed.   ED Course  Procedures  DIAGNOSTIC STUDIES: Oxygen Saturation is 100% on RA, normal by my interpretation.    COORDINATION OF CARE: 9:10 PM Discussed treatment plan with pt. Will order chest Xray and breathing treatment. Pt acknowledges and agrees to plan.    Imaging Review Dg Chest 2 View  10/21/2014   CLINICAL DATA:  Cough. Headache, nose, and ear congestion and pain. Productive cough. Midsternal chest pain. Shortness of breath today.  EXAM: CHEST  2 VIEW  COMPARISON:  08/16/2013  FINDINGS: The heart size and mediastinal contours are within normal limits. Both lungs are clear. The visualized skeletal structures are unremarkable. Nodular opacities projected over the mid lungs consistent with nipple shadows. No change since prior study.  IMPRESSION: No active cardiopulmonary disease.   Electronically Signed   By: Burman Nieves M.D.   On: 10/21/2014 22:19   I have personally reviewed and evaluated these images and lab results as part of my medical decision-making.   Patient will be treated for upper respiratory infection.  Told to return here as needed.  Patient is advised to increase her fluid intake and rest as much as possible.  There is no signs of pneumonia noted on chest x-ray and she has normal respiratory examination  Charlestine Night, PA-C 10/28/14 Rickey Primus  Doug Sou, MD 10/29/14 0700

## 2014-10-21 NOTE — ED Notes (Signed)
Pt. reports productive cough with chest congestion / mild SOB onset today , denies fever or chills.

## 2014-10-21 NOTE — Discharge Instructions (Signed)
Your x-rays did not show any signs of pneumonia.  Return here as needed.  Follow-up with your primary care doctor

## 2015-06-12 ENCOUNTER — Other Ambulatory Visit: Payer: Self-pay | Admitting: Physician Assistant

## 2015-06-12 DIAGNOSIS — Z1231 Encounter for screening mammogram for malignant neoplasm of breast: Secondary | ICD-10-CM

## 2015-06-19 ENCOUNTER — Telehealth: Payer: Self-pay | Admitting: Oncology

## 2015-06-19 ENCOUNTER — Encounter: Payer: Self-pay | Admitting: Oncology

## 2015-06-19 NOTE — Telephone Encounter (Signed)
Spoke with patient re new patient hem appointment with Dr Clelia CroftShadad for 07/11/15 @ 11 am to arrive 10:30 am.  Patient demographic and insurance information confirmed.

## 2015-07-11 ENCOUNTER — Ambulatory Visit (HOSPITAL_BASED_OUTPATIENT_CLINIC_OR_DEPARTMENT_OTHER): Admitting: Oncology

## 2015-07-11 ENCOUNTER — Ambulatory Visit
Admission: RE | Admit: 2015-07-11 | Discharge: 2015-07-11 | Disposition: A | Discharge status: Home / Self Care | Source: Ambulatory Visit | Attending: Physician Assistant | Admitting: Physician Assistant

## 2015-07-11 VITALS — BP 133/89 | HR 88 | Temp 98.1°F | Resp 18 | Ht <= 58 in | Wt 92.7 lb

## 2015-07-11 DIAGNOSIS — Z87891 Personal history of nicotine dependence: Secondary | ICD-10-CM

## 2015-07-11 DIAGNOSIS — D72819 Decreased white blood cell count, unspecified: Secondary | ICD-10-CM | POA: Diagnosis not present

## 2015-07-11 DIAGNOSIS — Z1231 Encounter for screening mammogram for malignant neoplasm of breast: Secondary | ICD-10-CM

## 2015-07-11 NOTE — Progress Notes (Signed)
Please see consult note.  

## 2015-07-11 NOTE — Consult Note (Signed)
Reason for Referral: Leukocytopenia.  HPI: 55 year old woman currently of [place] where she have been living for the last few years. She was born and raised in this area but also lived in the [place] part of the state for many years. She had a CBC done on 06/12/2015 by her primary care provider and she was noted to have white cell count of 2.8. The lower limit of normal was 3.4 and she had a normal differential. Her neutrophil percentage was 43% which was normal. She had a near normal hemoglobin of 13.8 and a platelet count of 335. Her absolute neutrophil count was 1200. She had normal chemistries, liver function tests, kidney function test and electrolytes. She is completely asymptomatic. She does have history of asthma and allergies and does require inhalers. She does report she had been told that she had low white cell count for many years with fluctuating close to normal range. She denied any recurrent infections. She was told that she had pneumonia about 2 years ago but never really hospitalized for any serious infections. She denied any autoimmune disorders such as rheumatoid arthritis or lupus. She denied any recent illnesses or hospitalizations. She continues to work full time without any decline in her energy or performance status. She denied any constitutional symptoms or decline in her quality of life.  She does not report any headaches, blurry vision, syncope or seizures. She does not report any fevers, chills, sweats, weight loss or lymphadenopathy. She denied any chest pain, palpitation, leg edema or orthopnea. She does not report any cough, wheezing or hemoptysis. She does not report any nausea, vomiting, abdominal pain, constipation or diarrhea. She does not report any frequency, urgency or hesitancy. She does not report any skeletal complaints. Remaining review of systems unremarkable.   Past Medical History  Diagnosis Date  . High cholesterol   . Asthma   . GERD (gastroesophageal  reflux disease)   :  Past Surgical History  Procedure Laterality Date  . Eye surgery    . Abdominal hysterectomy    . Appendectomy    . Ankle surgery    :   Current outpatient prescriptions:  .  Acetaminophen (TYLENOL PO), Take 2 tablets by mouth daily as needed (for headache)., Disp: , Rfl:  .  albuterol (PROVENTIL HFA;VENTOLIN HFA) 108 (90 BASE) MCG/ACT inhaler, Inhale 2 puffs into the lungs every 6 (six) hours as needed for wheezing or shortness of breath., Disp: , Rfl:  .  Ascorbic Acid (VITAMIN C GUMMIE PO), Take 2 tablets by mouth daily., Disp: , Rfl:  .  atorvastatin (LIPITOR) 10 MG tablet, Take 10 mg by mouth daily at 6 PM. , Disp: , Rfl:  .  cetirizine (ZYRTEC) 10 MG tablet, Take 10 mg by mouth at bedtime. , Disp: , Rfl:  .  Cholecalciferol (VITAMIN D) 1000 UNITS capsule, Take 1,000 Units by mouth daily., Disp: , Rfl:  .  cyclobenzaprine (FLEXERIL) 5 MG tablet, Take 5 mg by mouth 3 (three) times daily as needed for muscle spasms., Disp: , Rfl:  .  esomeprazole (NEXIUM) 40 MG capsule, Take 40 mg by mouth daily at 12 noon., Disp: , Rfl:  .  fluticasone (FLONASE) 50 MCG/ACT nasal spray, Place 1 spray into both nostrils daily., Disp: , Rfl:  .  Fluticasone-Salmeterol (ADVAIR) 250-50 MCG/DOSE AEPB, Inhale 1 puff into the lungs 2 (two) times daily., Disp: , Rfl:  .  hydrocortisone valerate cream (WESTCORT) 0.2 %, Apply 1 application topically 2 (two) times daily as needed (for  dry skin)., Disp: , Rfl:  .  Multiple Vitamins-Minerals (MULTIVITAMIN WITH MINERALS) tablet, Take 1 tablet by mouth daily., Disp: , Rfl:  .  naproxen (NAPROSYN) 500 MG tablet, Take 1 tablet (500 mg total) by mouth 2 (two) times daily., Disp: 30 tablet, Rfl: 0 .  vitamin E 200 UNIT capsule, Take 200 Units by mouth daily., Disp: , Rfl: :  Allergies  Allergen Reactions  . Ampicillin Itching  . Ceclor [Cefaclor] Other (See Comments)    Passed out   . Sulfa Antibiotics   . Tape Itching  . Tetracyclines &  Related Itching  . Tylenol With Codeine #3 [Acetaminophen-Codeine] Other (See Comments)    Passed out   :  No family history That is contributory to her illness. She denied any malignancies or blood disorders.   Social History   Social History  . Marital Status: Married    Spouse Name: N/A  . Number of Children: N/A  . Years of Education: N/A   Occupational History  . Not on file.   Social History Main Topics  . Smoking status: Former Research scientist (life sciences)  . Smokeless tobacco: Not on file  . Alcohol Use: No  . Drug Use: No  . Sexual Activity: Not on file   Other Topics Concern  . Not on file   Social History Narrative  :  Pertinent items are noted in HPI.  Exam: Blood pressure 133/89, pulse 88, temperature 98.1 F (36.7 C), temperature source Oral, resp. rate 18, height 4' 9"  (1.448 m), weight 92 lb 11.2 oz (42.048 kg), SpO2 100 %. General appearance: alert and cooperative appeared without distress. Head: Normocephalic, without obvious abnormality Throat: lips, mucosa, and tongue normal; teeth and gums normal Neck: no adenopathy Back: negative Resp: clear to auscultation bilaterally Chest wall: no tenderness Cardio: regular rate and rhythm, S1, S2 normal, no murmur, click, rub or gallop GI: soft, non-tender; bowel sounds normal; no masses,  no organomegaly no shifting dullness or ascites. Extremities: extremities normal, atraumatic, no cyanosis or edema Skin: Skin color, texture, turgor normal. No rashes or lesions Lymph nodes: Cervical, supraclavicular, and axillary nodes normal. Neurologic: Grossly normal  CBC    Component Value Date/Time   WBC 9.8 08/16/2013 1906   RBC 4.15 08/16/2013 1906   HGB 12.0 08/16/2013 1906   HCT 35.6* 08/16/2013 1906   PLT 248 08/16/2013 1906   MCV 85.8 08/16/2013 1906   MCH 28.9 08/16/2013 1906   MCHC 33.7 08/16/2013 1906   RDW 12.2 08/16/2013 1906   LYMPHSABS 0.8 08/16/2013 1906   MONOABS 0.7 08/16/2013 1906   EOSABS 0.0 08/16/2013  1906   BASOSABS 0.0 08/16/2013 1906      Assessment and Plan:   55 year old woman with the following issues:  1. Leukocytopenia with normal differential and normal CBC. Her white cell count was detected at 2.8 in April 2017. 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 His CBC was normal in June 2015. The differential diagnosis was discussed today with Ms. Sally Buck. 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 Other considerations would include autoimmune findings, lymphoproliferative disorder, myelodysplasia, among other hematological conditions that are extremely unlikely. Her presentation does not support any malignant disorder at this time.  A management standpoint, I do not recommend any further workup including imaging studies or bone marrow biopsy. I recommended annual CBC as a part of her routine physical examination. I am happy to reevaluate her counts in the future if  she develops any other cytopenias. In the meantime, I feel observation and surveillance is the only intervention.  2. Follow-up: I am happy to see her in the future as needed.

## 2018-02-04 ENCOUNTER — Other Ambulatory Visit: Payer: Self-pay | Admitting: *Deleted

## 2018-02-04 DIAGNOSIS — Z1231 Encounter for screening mammogram for malignant neoplasm of breast: Secondary | ICD-10-CM

## 2018-03-17 ENCOUNTER — Ambulatory Visit
Admission: RE | Admit: 2018-03-17 | Discharge: 2018-03-17 | Disposition: A | Discharge status: Home / Self Care | Source: Ambulatory Visit | Attending: *Deleted | Admitting: *Deleted

## 2018-03-17 DIAGNOSIS — Z1231 Encounter for screening mammogram for malignant neoplasm of breast: Secondary | ICD-10-CM

## 2019-07-05 ENCOUNTER — Other Ambulatory Visit: Payer: Self-pay | Admitting: *Deleted

## 2019-07-05 DIAGNOSIS — Z1231 Encounter for screening mammogram for malignant neoplasm of breast: Secondary | ICD-10-CM

## 2019-07-13 ENCOUNTER — Emergency Department (HOSPITAL_COMMUNITY)

## 2019-07-13 ENCOUNTER — Encounter (HOSPITAL_COMMUNITY): Payer: Self-pay | Admitting: Emergency Medicine

## 2019-07-13 ENCOUNTER — Inpatient Hospital Stay (HOSPITAL_COMMUNITY)
Admission: EM | Admit: 2019-07-13 | Discharge: 2019-07-22 | DRG: 853 | Disposition: A | Discharge status: Home / Self Care | Attending: Internal Medicine | Admitting: Internal Medicine

## 2019-07-13 ENCOUNTER — Other Ambulatory Visit: Payer: Self-pay

## 2019-07-13 DIAGNOSIS — J869 Pyothorax without fistula: Secondary | ICD-10-CM | POA: Diagnosis not present

## 2019-07-13 DIAGNOSIS — R079 Chest pain, unspecified: Secondary | ICD-10-CM

## 2019-07-13 DIAGNOSIS — J189 Pneumonia, unspecified organism: Secondary | ICD-10-CM | POA: Diagnosis present

## 2019-07-13 DIAGNOSIS — Z79899 Other long term (current) drug therapy: Secondary | ICD-10-CM

## 2019-07-13 DIAGNOSIS — A419 Sepsis, unspecified organism: Secondary | ICD-10-CM | POA: Diagnosis present

## 2019-07-13 DIAGNOSIS — J9601 Acute respiratory failure with hypoxia: Secondary | ICD-10-CM | POA: Diagnosis present

## 2019-07-13 DIAGNOSIS — K219 Gastro-esophageal reflux disease without esophagitis: Secondary | ICD-10-CM | POA: Diagnosis present

## 2019-07-13 DIAGNOSIS — A4102 Sepsis due to Methicillin resistant Staphylococcus aureus: Principal | ICD-10-CM | POA: Diagnosis present

## 2019-07-13 DIAGNOSIS — Z20822 Contact with and (suspected) exposure to covid-19: Secondary | ICD-10-CM | POA: Diagnosis present

## 2019-07-13 DIAGNOSIS — E860 Dehydration: Secondary | ICD-10-CM | POA: Diagnosis present

## 2019-07-13 DIAGNOSIS — E785 Hyperlipidemia, unspecified: Secondary | ICD-10-CM | POA: Diagnosis present

## 2019-07-13 DIAGNOSIS — E871 Hypo-osmolality and hyponatremia: Secondary | ICD-10-CM | POA: Diagnosis not present

## 2019-07-13 DIAGNOSIS — R000 Tachycardia, unspecified: Secondary | ICD-10-CM | POA: Diagnosis present

## 2019-07-13 DIAGNOSIS — Z880 Allergy status to penicillin: Secondary | ICD-10-CM

## 2019-07-13 DIAGNOSIS — B9562 Methicillin resistant Staphylococcus aureus infection as the cause of diseases classified elsewhere: Secondary | ICD-10-CM | POA: Diagnosis present

## 2019-07-13 DIAGNOSIS — Z91048 Other nonmedicinal substance allergy status: Secondary | ICD-10-CM

## 2019-07-13 DIAGNOSIS — Z09 Encounter for follow-up examination after completed treatment for conditions other than malignant neoplasm: Secondary | ICD-10-CM

## 2019-07-13 DIAGNOSIS — Z791 Long term (current) use of non-steroidal anti-inflammatories (NSAID): Secondary | ICD-10-CM

## 2019-07-13 DIAGNOSIS — Z8701 Personal history of pneumonia (recurrent): Secondary | ICD-10-CM

## 2019-07-13 DIAGNOSIS — Z87891 Personal history of nicotine dependence: Secondary | ICD-10-CM

## 2019-07-13 DIAGNOSIS — J440 Chronic obstructive pulmonary disease with acute lower respiratory infection: Secondary | ICD-10-CM | POA: Diagnosis present

## 2019-07-13 DIAGNOSIS — Z881 Allergy status to other antibiotic agents status: Secondary | ICD-10-CM

## 2019-07-13 DIAGNOSIS — J90 Pleural effusion, not elsewhere classified: Secondary | ICD-10-CM

## 2019-07-13 DIAGNOSIS — Z885 Allergy status to narcotic agent status: Secondary | ICD-10-CM

## 2019-07-13 DIAGNOSIS — Z9889 Other specified postprocedural states: Secondary | ICD-10-CM

## 2019-07-13 LAB — CBC
HCT: 38.8 % (ref 36.0–46.0)
Hemoglobin: 12.8 g/dL (ref 12.0–15.0)
MCH: 29.4 pg (ref 26.0–34.0)
MCHC: 33 g/dL (ref 30.0–36.0)
MCV: 89 fL (ref 80.0–100.0)
Platelets: 416 10*3/uL — ABNORMAL HIGH (ref 150–400)
RBC: 4.36 MIL/uL (ref 3.87–5.11)
RDW: 11.4 % — ABNORMAL LOW (ref 11.5–15.5)
WBC: 8.6 10*3/uL (ref 4.0–10.5)
nRBC: 0 % (ref 0.0–0.2)

## 2019-07-13 LAB — BASIC METABOLIC PANEL
Anion gap: 15 (ref 5–15)
BUN: 12 mg/dL (ref 8–23)
CO2: 24 mmol/L (ref 22–32)
Calcium: 9.3 mg/dL (ref 8.9–10.3)
Chloride: 92 mmol/L — ABNORMAL LOW (ref 98–111)
Creatinine, Ser: 0.68 mg/dL (ref 0.44–1.00)
GFR calc Af Amer: >60 mL/min (ref >60)
GFR calc non Af Amer: >60 mL/min (ref >60)
Glucose, Bld: 177 mg/dL — ABNORMAL HIGH (ref 70–99)
Potassium: 4.3 mmol/L (ref 3.5–5.1)
Sodium: 131 mmol/L — ABNORMAL LOW (ref 135–145)

## 2019-07-13 LAB — SARS CORONAVIRUS 2 BY RT PCR (HOSPITAL ORDER, PERFORMED IN ~~LOC~~ HOSPITAL LAB): SARS Coronavirus 2: NEGATIVE

## 2019-07-13 LAB — TROPONIN I (HIGH SENSITIVITY)
Troponin I (High Sensitivity): 11 ng/L (ref <18)
Troponin I (High Sensitivity): 22 ng/L — ABNORMAL HIGH (ref <18)

## 2019-07-13 LAB — LACTIC ACID, PLASMA: Lactic Acid, Venous: 1.2 mmol/L (ref 0.5–1.9)

## 2019-07-13 LAB — D-DIMER, QUANTITATIVE: D-Dimer, Quant: 3.59 ug/mL-FEU — ABNORMAL HIGH (ref 0.00–0.50)

## 2019-07-13 MED ORDER — SODIUM CHLORIDE 0.9 % IV SOLN: INTRAVENOUS | Status: DC

## 2019-07-13 MED ORDER — SODIUM CHLORIDE 0.9 % IV SOLN
2.0000 g | Freq: Once | INTRAVENOUS | Status: AC
  Administered 2019-07-14: 2 g via INTRAVENOUS
  Filled 2019-07-13: qty 20

## 2019-07-13 MED ORDER — ALBUTEROL SULFATE HFA 108 (90 BASE) MCG/ACT IN AERS
2.0000 | INHALATION_SPRAY | Freq: Four times a day (QID) | RESPIRATORY_TRACT | Status: DC | PRN
  Filled 2019-07-13: qty 6.7

## 2019-07-13 MED ORDER — ACETAMINOPHEN 325 MG PO TABS
650.0000 mg | ORAL_TABLET | Freq: Once | ORAL | Status: AC
  Administered 2019-07-13: 650 mg via ORAL
  Filled 2019-07-13: qty 2

## 2019-07-13 MED ORDER — DOCUSATE SODIUM 100 MG PO CAPS
100.0000 mg | ORAL_CAPSULE | Freq: Two times a day (BID) | ORAL | Status: DC
  Administered 2019-07-14 – 2019-07-22 (×10): 100 mg via ORAL
  Filled 2019-07-13 (×13): qty 1

## 2019-07-13 MED ORDER — ACETAMINOPHEN 650 MG RE SUPP: 650.0000 mg | Freq: Four times a day (QID) | RECTAL | Status: DC | PRN

## 2019-07-13 MED ORDER — SODIUM CHLORIDE 0.9 % IV BOLUS (SEPSIS)
500.0000 mL | Freq: Once | INTRAVENOUS | Status: AC
  Administered 2019-07-13: 500 mL via INTRAVENOUS

## 2019-07-13 MED ORDER — LEVOFLOXACIN IN D5W 750 MG/150ML IV SOLN
750.0000 mg | Freq: Once | INTRAVENOUS | Status: AC
  Administered 2019-07-13: 750 mg via INTRAVENOUS
  Filled 2019-07-13: qty 150

## 2019-07-13 MED ORDER — IOHEXOL 350 MG/ML SOLN
75.0000 mL | Freq: Once | INTRAVENOUS | Status: AC | PRN
  Administered 2019-07-13: 75 mL via INTRAVENOUS

## 2019-07-13 MED ORDER — LEVALBUTEROL HCL 0.63 MG/3ML IN NEBU
0.6300 mg | INHALATION_SOLUTION | Freq: Four times a day (QID) | RESPIRATORY_TRACT | Status: DC | PRN
  Administered 2019-07-15: 0.63 mg via RESPIRATORY_TRACT
  Filled 2019-07-13: qty 3

## 2019-07-13 MED ORDER — ONDANSETRON HCL 4 MG PO TABS: 4.0000 mg | ORAL_TABLET | Freq: Four times a day (QID) | ORAL | Status: DC | PRN

## 2019-07-13 MED ORDER — SODIUM CHLORIDE 0.9% FLUSH
3.0000 mL | Freq: Two times a day (BID) | INTRAVENOUS | Status: DC
  Administered 2019-07-14 – 2019-07-22 (×18): 3 mL via INTRAVENOUS

## 2019-07-13 MED ORDER — SODIUM CHLORIDE 0.9 % IV SOLN
2.0000 g | INTRAVENOUS | Status: DC
  Administered 2019-07-14: 2 g via INTRAVENOUS
  Filled 2019-07-13: qty 20

## 2019-07-13 MED ORDER — MOMETASONE FURO-FORMOTEROL FUM 200-5 MCG/ACT IN AERO
2.0000 | INHALATION_SPRAY | Freq: Two times a day (BID) | RESPIRATORY_TRACT | Status: DC
  Administered 2019-07-15 – 2019-07-22 (×14): 2 via RESPIRATORY_TRACT
  Filled 2019-07-13 (×2): qty 8.8

## 2019-07-13 MED ORDER — ACETAMINOPHEN 325 MG PO TABS
650.0000 mg | ORAL_TABLET | Freq: Four times a day (QID) | ORAL | Status: DC | PRN
  Administered 2019-07-14 – 2019-07-15 (×2): 650 mg via ORAL
  Filled 2019-07-13 (×2): qty 2

## 2019-07-13 MED ORDER — SODIUM CHLORIDE 0.9 % IV BOLUS
1000.0000 mL | Freq: Once | INTRAVENOUS | Status: AC
  Administered 2019-07-13: 1000 mL via INTRAVENOUS

## 2019-07-13 MED ORDER — MORPHINE SULFATE (PF) 4 MG/ML IV SOLN
4.0000 mg | Freq: Once | INTRAVENOUS | Status: AC
  Administered 2019-07-13: 4 mg via INTRAVENOUS
  Filled 2019-07-13: qty 1

## 2019-07-13 MED ORDER — HYDROCODONE-ACETAMINOPHEN 5-325 MG PO TABS
1.0000 | ORAL_TABLET | ORAL | Status: DC | PRN
  Administered 2019-07-14: 2 via ORAL
  Filled 2019-07-13: qty 2

## 2019-07-13 MED ORDER — ONDANSETRON HCL 4 MG/2ML IJ SOLN: 4.0000 mg | Freq: Four times a day (QID) | INTRAMUSCULAR | Status: DC | PRN

## 2019-07-13 MED ORDER — LORATADINE 10 MG PO TABS
10.0000 mg | ORAL_TABLET | Freq: Every day | ORAL | Status: DC
  Administered 2019-07-14 – 2019-07-22 (×9): 10 mg via ORAL
  Filled 2019-07-13 (×9): qty 1

## 2019-07-13 MED ORDER — LATANOPROST 0.005 % OP SOLN
1.0000 [drp] | Freq: Every day | OPHTHALMIC | Status: DC
  Administered 2019-07-15 – 2019-07-21 (×7): 1 [drp] via OPHTHALMIC
  Filled 2019-07-13 (×3): qty 2.5

## 2019-07-13 MED ORDER — SODIUM CHLORIDE 0.9 % IV SOLN
1000.0000 mL | INTRAVENOUS | Status: DC
  Administered 2019-07-13: 1000 mL via INTRAVENOUS

## 2019-07-13 MED ORDER — MORPHINE SULFATE (PF) 4 MG/ML IV SOLN
4.0000 mg | Freq: Once | INTRAVENOUS | Status: AC
  Administered 2019-07-14: 4 mg via INTRAVENOUS
  Filled 2019-07-13: qty 1

## 2019-07-13 MED ORDER — SODIUM CHLORIDE 0.9 % IV SOLN
500.0000 mg | INTRAVENOUS | Status: DC
  Filled 2019-07-13: qty 500

## 2019-07-13 NOTE — H&P (Signed)
Lexington Devine YQM:[id number] DOB: [birth year] DOA: 07/13/2019     PCP: Marva Panda, NP   Outpatient Specialists:  NONE   Patient arrived to ER on 07/13/19 at 1536  Patient coming from: home Lives alone     Chief Complaint:   Chief Complaint  Patient presents with  . Allergic Reaction    HPI: Carnetta Losada is a 59 y.o. female with medical history significant of asthma    Presented with  2wk hx of pain in her shoulder she was given NSIADS and prednisone she started to be short of breath today and back pain she did not know she had fever until she came to ER Patient stating that her cough has been getting progressively worse. She is now having significant pain with coughing.   Infectious risk factors:  Reports  fever, shortness of breath,   cough, chest pain   Has  Not been vaccinated against COVID   In  ER  COVID TEST  NEGATIVE     Lab Results  Component Value Date   SARSCOV2NAA NEGATIVE 07/13/2019    Regarding pertinent Chronic problems:      Asthma -well  controlled on home inhalers     While in ER: Fever 101, tach y to 140\ No hypotention Tr 11 - 22 D.dimer was up CTA showed possible infiltrate no PE Pleural effusion with loculazation, malignancy cannot be ruled out  Hospitalist was called for admission for CAP  The following Work up has been ordered so far:  Orders Placed This Encounter  Procedures  . SARS Coronavirus 2 by RT PCR (hospital order, performed in [facility] hospital lab) Nasopharyngeal Nasopharyngeal Swab  . Culture, blood (Routine X 2) w Reflex to ID Panel  . DG Chest 2 View  . CT Angio Chest PE W and/or Wo Contrast  . Basic metabolic panel  . CBC  . D-dimer, quantitative (not at [facility])  . Lactic acid, plasma  . Cardiac monitoring  . Saline Lock IV, Maintain IV access  . Vital signs  . Vital signs  . Consult for Unassigned Medical Admission  ALL PATIENTS BEING ADMITTED/HAVING PROCEDURES NEED COVID-19  SCREENING  . Airborne and Contact precautions  . Pulse oximetry, continuous  . EKG 12-Lead  . ED EKG     Following Medications were ordered in ER: Medications  sodium chloride 0.9 % bolus 500 mL (500 mLs Intravenous New Bag/Given 07/13/19 1809)    Followed by  0.9 %  sodium chloride infusion (1,000 mLs Intravenous New Bag/Given 07/13/19 1809)  acetaminophen (TYLENOL) tablet 650 mg (650 mg Oral Given 07/13/19 1604)  acetaminophen (TYLENOL) tablet 650 mg (650 mg Oral Given 07/13/19 1716)  levofloxacin (LEVAQUIN) IVPB 750 mg (0 mg Intravenous Stopped 07/13/19 1937)  iohexol (OMNIPAQUE) 350 MG/ML injection 75 mL (75 mLs Intravenous Contrast Given 07/13/19 2010)  morphine 4 MG/ML injection 4 mg (4 mg Intravenous Given 07/13/19 2133)  sodium chloride 0.9 % bolus 1,000 mL (1,000 mLs Intravenous New Bag/Given 07/13/19 2133)    Significant initial  Findings: Abnormal Labs Reviewed  BASIC METABOLIC PANEL - Abnormal; Notable for the following components:      Result Value   Sodium 131 (*)    Chloride 92 (*)    Glucose, Bld 177 (*)    All other components within normal limits  CBC - Abnormal; Notable for the following components:   RDW 11.4 (*)    Platelets 416 (*)    All other components within normal limits  D-DIMER, QUANTITATIVE (NOT AT [facility]) - Abnormal; Notable for the following components:   D-Dimer, Quant 3.59 (*)    All other components within normal limits  TROPONIN I (HIGH SENSITIVITY) - Abnormal; Notable for the following components:   Troponin I (High Sensitivity) 22 (*)    All other components within normal limits     Otherwise labs showing:    Recent Labs  Lab 07/13/19 1608  NA 131*  K 4.3  CO2 24  GLUCOSE 177*  BUN 12  CREATININE 0.68  CALCIUM 9.3    Cr  stable,    Lab Results  Component Value Date   CREATININE 0.68 07/13/2019   CREATININE 0.66 08/16/2013   CREATININE 0.88 01/22/2013    No results for input(s): AST, ALT, ALKPHOS, BILITOT, PROT, ALBUMIN in the  last 168 hours. Lab Results  Component Value Date   CALCIUM 9.3 07/13/2019      WBC      Component Value Date/Time   WBC 8.6 07/13/2019 1608   ANC    Component Value Date/Time   NEUTROABS 8.3 (H) 08/16/2013 1906   ALC No components found for: LYMPHAB   Plt: Lab Results  Component Value Date   PLT 416 (H) 07/13/2019    Lactic Acid, Venous    Component Value Date/Time   LATICACIDVEN 1.2 07/13/2019 1730    Procalcitonin   Ordered   COVID-19 Labs  Recent Labs    07/13/19 1730  DDIMER 3.59*    Lab Results  Component Value Date   SARSCOV2NAA NEGATIVE 07/13/2019    HG/HCT  Stable     Component Value Date/Time   HGB 12.8 07/13/2019 1608   HCT 38.8 07/13/2019 1608    No results for input(s): LIPASE, AMYLASE in the last 168 hours. No results for input(s): AMMONIA in the last 168 hours.  No components found for: LABALBU   Troponin 11-22 Cardiac Panel (last 3 results) No results for input(s): CKTOTAL, CKMB, TROPONINI, RELINDX in the last 72 hours.     ECG: Ordered Personally reviewed by me showing: HR : 142 Rhythm:  Sinus tachycardia    no evidence of ischemic changes QTC 412      UA   ordered   Ordered    CXR -pleural effusion  With infiltrate   CTA chest -  no PE,  evidence of infiltrate and pleural effusion     ED Triage Vitals  Enc Vitals Group     BP 07/13/19 1559 (!) 150/88     Pulse Rate 07/13/19 1559 (!) 142     Resp 07/13/19 1559 (!) 22     Temp 07/13/19 1559 (!) 101.1 F (38.4 C)     Temp Source 07/13/19 1559 Oral     SpO2 07/13/19 1559 93 %     Weight 07/13/19 1558 95 lb 12.8 oz (43.5 kg)     Height 07/13/19 1558 4\' 9"  (1.448 m)     Head Circumference --      Peak Flow --      Pain Score 07/13/19 1557 10     Pain Loc --      Pain Edu? --      Excl. in GC? --   TMAX(24)@       Latest  Blood pressure 119/72, pulse (!) 121, temperature 99.5 F (37.5 C), resp. rate (!) 22, height 4\' 9"  (1.448 m), weight 43.5 kg, SpO2 97 %.       Review of Systems:    Pertinent positives  include: Fevers, chills, fatigue,  Constitutional:  No weight loss, night sweats,  weight loss  HEENT:  No headaches, Difficulty swallowing,Tooth/dental problems,Sore throat,  No sneezing, itching, ear ache, nasal congestion, post nasal drip,  Cardio-vascular:  No chest pain, Orthopnea, PND, anasarca, dizziness, palpitations.no Bilateral lower extremity swelling  GI:  No heartburn, indigestion, abdominal pain, nausea, vomiting, diarrhea, change in bowel habits, loss of appetite, melena, blood in stool, hematemesis Resp:  no shortness of breath at rest. No dyspnea on exertion, No excess mucus, no productive cough, No non-productive cough, No coughing up of blood.No change in color of mucus.No wheezing. Skin:  no rash or lesions. No jaundice GU:  no dysuria, change in color of urine, no urgency or frequency. No straining to urinate.  No flank pain.  Musculoskeletal:  No joint pain or no joint swelling. No decreased range of motion. No back pain.  Psych:  No change in mood or affect. No depression or anxiety. No memory loss.  Neuro: no localizing neurological complaints, no tingling, no weakness, no double vision, no gait abnormality, no slurred speech, no confusion  All systems reviewed and apart from HOPI all are negative  Past Medical History:   Past Medical History:  Diagnosis Date  . Asthma   . GERD (gastroesophageal reflux disease)   . High cholesterol       Past Surgical History:  Procedure Laterality Date  . ABDOMINAL HYSTERECTOMY    . ANKLE SURGERY    . APPENDECTOMY    . EYE SURGERY      Social History:  Ambulatory   Independently     reports that she has quit smoking. She does not have any smokeless tobacco history on file. She reports that she does not drink alcohol or use drugs.   Family History:   Family History  Problem Relation Age of Onset  . CAD Mother   . Diabetes Sister   . Diabetes Brother      Allergies: Allergies  Allergen Reactions  . Ampicillin Itching  . Ceclor [Cefaclor] Other (See Comments)    Passed out   . Other Other (See Comments)    travoprost : unknown  . Sulfa Antibiotics   . Tape Itching  . Tetracyclines & Related Itching  . Tylenol With Codeine #3 [Acetaminophen-Codeine] Other (See Comments)    Passed out      Prior to Admission medications   Medication Sig Start Date End Date Taking? Authorizing Provider  albuterol (PROVENTIL HFA;VENTOLIN HFA) 108 (90 BASE) MCG/ACT inhaler Inhale 2 puffs into the lungs every 6 (six) hours as needed for wheezing or shortness of breath.   Yes [provider]  Ascorbic Acid (VITAMIN C GUMMIE PO) Take 2 tablets by mouth daily.   Yes [provider]  cetirizine (ZYRTEC) 10 MG tablet Take 10 mg by mouth at bedtime.    Yes [provider]  Cholecalciferol (VITAMIN D) 1000 UNITS capsule Take 1,000 Units by mouth daily.   Yes [provider]  cyclobenzaprine (FLEXERIL) 5 MG tablet Take 5 mg by mouth 3 (three) times daily as needed for muscle spasms.   Yes [provider]  fluticasone (FLONASE) 50 MCG/ACT nasal spray Place 1 spray into both nostrils daily.   Yes [provider]  Fluticasone-Salmeterol (ADVAIR) 250-50 MCG/DOSE AEPB Inhale 1 puff into the lungs 2 (two) times daily.   Yes [provider]  latanoprost (XALATAN) 0.005 % ophthalmic solution Place 1 drop into both eyes at bedtime.  02/23/19  Yes  [provider]  meloxicam (MOBIC) 15 MG tablet Take 15 mg by mouth daily. 07/12/19  Yes [provider]  methylPREDNISolone (MEDROL DOSEPAK) 4 MG TBPK tablet Take 12 mg by mouth See admin instructions. Medrol dose pack : Take 6 tablets by mouth on day 1 then 5 tablet on day 2, 4 tablets on day 3, 3 tablets on day 4, 2 tablets on day 5 and 1 tablet on day 6. 07/12/19  Yes [provider]  Multiple Vitamins-Minerals (MULTIVITAMIN WITH MINERALS)  tablet Take 1 tablet by mouth daily.   Yes [provider]  vitamin E 200 UNIT capsule Take 200 Units by mouth daily.   Yes [provider]  naproxen (NAPROSYN) 500 MG tablet Take 1 tablet (500 mg total) by mouth 2 (two) times daily. Patient not taking: Reported on 07/13/2019 08/04/14   Antony Madura, PA-C   Physical Exam: Blood pressure 119/72, pulse (!) 121, temperature 99.5 F (37.5 C), resp. rate (!) 22, height 4\' 9"  (1.448 m), weight 43.5 kg, SpO2 97 %. 1. General:  in   Acute distress  complaining of severe pain with coughing  Chronically ill  -appearing 2. Psychological: Alert and  Oriented 3. Head/ENT:     Dry Mucous Membranes                          Head Non traumatic, neck supple                           Poor Dentition 4. SKIN:  decreased Skin turgor,  Skin clean Dry and intact no rash 5. Heart: Regular rate and rhythm no Murmur, no Rub or gallop 6. Lungs:  no wheezes or crackles  Diminished on the left 7. Abdomen: Soft,  non-tender, Non distended  bowel sounds present 8. Lower extremities: no clubbing, cyanosis, no edema 9. Neurologically Grossly intact, moving all 4 extremities equally  10. MSK: Normal range of motion   All other LABS:     Recent Labs  Lab 07/13/19 1608  WBC 8.6  HGB 12.8  HCT 38.8  MCV 89.0  PLT 416*     Recent Labs  Lab 07/13/19 1608  NA 131*  K 4.3  CL 92*  CO2 24  GLUCOSE 177*  BUN 12  CREATININE 0.68  CALCIUM 9.3     No results for input(s): AST, ALT, ALKPHOS, BILITOT, PROT, ALBUMIN in the last 168 hours.     Cultures:    Component Value Date/Time   SDES URINE, CLEAN CATCH 01/22/2013 2147   SPECREQUEST NONE 01/22/2013 2147   CULT  01/22/2013 2147    INSIGNIFICANT GROWTH Performed at [facility]   REPTSTATUS 01/24/2013 FINAL 01/22/2013 2147     Radiological Exams on Admission: DG Chest 2 View  Result Date: 07/13/2019 CLINICAL DATA:  Chest pain, shortness of breath. EXAM: CHEST - 2 VIEW  COMPARISON:  October 21, 2014. FINDINGS: The heart size and mediastinal contours are within normal limits. No pneumothorax is noted. Right lung is clear. Mild left pleural effusion is noted with associated left basilar infiltrate or atelectasis. The visualized skeletal structures are unremarkable. IMPRESSION: Mild left pleural effusion is noted with associated left basilar infiltrate or atelectasis. Electronically Signed   By: October 23, 2014 M.D.   On: 07/13/2019 16:47   CT Angio Chest PE W and/or Wo Contrast  Result Date: 07/13/2019 CLINICAL DATA:  Shortness of breath with  left-sided chest pain. EXAM: CT ANGIOGRAPHY CHEST WITH CONTRAST TECHNIQUE: Multidetector CT imaging of the chest was performed using the standard protocol during bolus administration of intravenous contrast. Multiplanar CT image reconstructions and MIPs were obtained to evaluate the vascular anatomy. CONTRAST:  82mL OMNIPAQUE IOHEXOL 350 MG/ML SOLN COMPARISON:  None. FINDINGS: Cardiovascular: Heart size normal. Small pericardial effusion evident. No thoracic aortic aneurysm. No filling defects within the opacified pulmonary arteries to suggest the presence of an acute pulmonary embolus Mediastinum/Nodes: 11 mm short axis AP window lymph node visible on image 44/5 nodular soft tissue is identified in the anterior mediastinum, potentially related to small clustered lymph nodes. No right hilar lymphadenopathy. There is abnormal soft tissue attenuation in the left hilum. There is no axillary lymphadenopathy. Lungs/Pleura: Ground-glass opacity is seen fairly diffusely in the left upper lobe with diffuse consolidation of the lingula with airspace consolidation tracking centrally into the left hilum. There is left lower lobe collapse inferiorly. These changes are associated with a moderate left pleural effusion demonstrating loculation in the apex and anterior upper hemithorax. Nodular pleural thickening or loculated fluid is seen along the medial  pleura adjacent to the transverse aorta. No right pleural effusion. Upper Abdomen: Unremarkable. Musculoskeletal: No worrisome lytic or sclerotic osseous abnormality. Review of the MIP images confirms the above findings. IMPRESSION: 1. No CT evidence for acute pulmonary embolus. 2. Diffuse ground-glass opacity in the left upper lobe with diffuse consolidation of the lingula tracking centrally into the left hilum. This is associated with a moderate left pleural effusion demonstrating loculation and prominent areas of left lower lobe atelectasis. Nodular pleural thickening or loculated fluid is identified along the medial pleura adjacent to the transverse aorta. Mildly enlarged AP window lymph node with question of soft tissue nodularity/clustered lymph nodes in the anterior mediastinum. These changes may all be related to pneumonia, given the apparent pleural nodularity towards the apex, neoplasm is a distinct concern. Close follow-up recommended. Electronically Signed   By: Kennith Center M.D.   On: 07/13/2019 20:44    Chart has been reviewed    Assessment/Plan  59 y.o. female with medical history significant of asthma    Admitted for CAp with empyema  Present on Admission: . CAP (community acquired pneumonia) -  - will admit for treatment of CAP will start on appropriate antibiotic coverage.   Obtain:  sputum cultures,                 Covid negative and chest imaging findings not consistent with Covid                 Obtain respiratory panel                    blood cultures                   strep pneumo UA antigen,                  Check procalcitonin                Provide oxygen as needed.   . Empyema (HCC) - IR consult for possible drainage and to obtain sample Adjust antibiotics further based on results of cultures   . Tachycardia -in the setting of sepsis we will rehydrate  . Sepsis (HCC) -   -Patient meets sepsis criteria with  fever    Tachycardia   Initial lactic acid Lactic  Acid, Venous    Component Value Date/Time  LATICACIDVEN 1.2 07/13/2019 1730   Source most likely:  Pneumonia,   -We will rehydrate, treat with IV antibiotics, follow lactic acid - Await results of blood and urine culture and adjust antibiotics as needed - Obtain MRSA serologies  - Obtain respiratory panel      History of asthma mild continue butyryl as needed  Dehydration will rehydrate and follow kidney function  Hyponatremia mild in the setting of pneumonia obtain urine electrolytes gently rehydrate and follow   Other plan as per orders.  DVT prophylaxis:  SCD   Code Status:  FULL CODE  as per patient  I had personally discussed CODE STATUS with patient and family   Family Communication:   Family  at  Bedside  plan of care was discussed   with  Daughter  Disposition Plan:    To home once workup is complete and patient is stable   Following barriers for discharge:                                                         Pain controlled with PO medications                               Afebrile,   able to transition to PO antibiotics                             Will need to be able to tolerate PO                            Will likely need home health,                            Will need consultants to evaluate patient prior to discharge                       Would benefit from PT/OT eval prior to DC  Ordered                                       Consults called: IR consult through epic  Admission status:  ED Disposition    ED Disposition Condition [place]: [place] [[id number]]  Level of Care: Progressive [102]  Admit to Progressive based on following criteria: CARDIOVASCULAR & THORACIC of moderate stability with acute coronary syndrome symptoms/low risk myocardial infarction/hypertensive urgency/arrhythmias/heart failure potentially compromising stability and stable post cardiovascular intervention patients.  May admit patient to  [facility] or [facility] if equivalent level of care is available:: No  Covid Evaluation: Confirmed COVID Negative  Diagnosis: CAP (community acquired pneumonia) [182993]  Admitting Physician: Toy Baker [[id number]]  Attending Physician: Toy Baker [[id number]]  Estimated length of stay: 3 - 4 days  Certification:: I certify this patient will need inpatient services for at least 2 midnights        inpatient     I Expect 2 midnight stay secondary to severity of patient's current illness need for inpatient interventions justified by  the following:  hemodynamic instability despite optimal treatment (tachycardia )  Severe lab/radiological/exam abnormalities including:    PNA    I expect  patient to be hospitalized for 2 midnights requiring inpatient medical care.  Patient is at high risk for adverse outcome (such as loss of life or disability) if not treated.  Indication for inpatient stay as follows:    Hemodynamic instability despite maximal medical therapy,    severe pain requiring acute inpatient management,  inability to maintain oral hydration   persistent chest pain despite medical management Need for operative/procedural  intervention    Need for IV antibiotics, IV fluids,  IV pain medications,      Level of care      SDU tele indefinitely please discontinue once patient no longer qualifies   Precautions: admitted as Covid Negative  Airborne and Contact precautions   PPE: Used by the provider:   P100  eye Goggles,  Gloves   Naelani Lafrance 07/14/2019, 12:45 AM    [facility]     after 2 AM please page floor coverage PA If 7AM-7PM, please contact the day team taking care of the patient using [email or web address]   Patient was evaluated in the context of the global COVID-19 pandemic, which necessitated consideration that the patient might be at risk for infection with the SARS-CoV-2 virus that causes COVID-19. Institutional protocols and algorithms that  pertain to the evaluation of patients at risk for COVID-19 are in a state of rapid change based on information released by regulatory bodies including the CDC and federal and state organizations. These policies and algorithms were followed during the patient's care.

## 2019-07-13 NOTE — ED Provider Notes (Addendum)
[name] EMERGENCY DEPARTMENT Provider Note   CSN: [id number] Arrival date & time: 07/13/19  1536     History Medication reaction  Sally Buck is a 59 y.o. female.  HPI   About 2 weeks ago she was changing her posittion and she heard a pop.  A couple of days later she had severe pain in her shoulder.  She went to an urgent care.  She started taking nsaids and steroids.   After starting that medication yesterday, pt started having chest pain going to her back.  SHe has been feeling short of breath.  She has had a mild cough.  No vomiting.  No dysuria.  No rashes.  No swelling.  Pt was not aware that she was having fevers until she arrived here.  Patient has not had Covid infection.  She has not been vaccinated for Covid  Past Medical History:  Diagnosis Date  . Asthma   . GERD (gastroesophageal reflux disease)   . High cholesterol     There are no problems to display for this patient.   Past Surgical History:  Procedure Laterality Date  . ABDOMINAL HYSTERECTOMY    . ANKLE SURGERY    . APPENDECTOMY    . EYE SURGERY       OB History   No obstetric history on file.     No family history on file.  Social History   Tobacco Use  . Smoking status: Former Smoker  Substance Use Topics  . Alcohol use: No  . Drug use: No    Home Medications Prior to Admission medications   Medication Sig Start Date End Date Taking? Authorizing Provider  albuterol (PROVENTIL HFA;VENTOLIN HFA) 108 (90 BASE) MCG/ACT inhaler Inhale 2 puffs into the lungs every 6 (six) hours as needed for wheezing or shortness of breath.   Yes [provider]  Ascorbic Acid (VITAMIN C GUMMIE PO) Take 2 tablets by mouth daily.   Yes [provider]  cetirizine (ZYRTEC) 10 MG tablet Take 10 mg by mouth at bedtime.    Yes [provider]  Cholecalciferol (VITAMIN D) 1000 UNITS capsule Take 1,000 Units by mouth daily.   Yes [provider]    cyclobenzaprine (FLEXERIL) 5 MG tablet Take 5 mg by mouth 3 (three) times daily as needed for muscle spasms.   Yes [provider]  fluticasone (FLONASE) 50 MCG/ACT nasal spray Place 1 spray into both nostrils daily.   Yes [provider]  Fluticasone-Salmeterol (ADVAIR) 250-50 MCG/DOSE AEPB Inhale 1 puff into the lungs 2 (two) times daily.   Yes [provider]  latanoprost (XALATAN) 0.005 % ophthalmic solution Place 1 drop into both eyes at bedtime.  02/23/19  Yes [provider]  meloxicam (MOBIC) 15 MG tablet Take 15 mg by mouth daily. 07/12/19  Yes [provider]  methylPREDNISolone (MEDROL DOSEPAK) 4 MG TBPK tablet Take 12 mg by mouth See admin instructions. Medrol dose pack : Take 6 tablets by mouth on day 1 then 5 tablet on day 2, 4 tablets on day 3, 3 tablets on day 4, 2 tablets on day 5 and 1 tablet on day 6. 07/12/19  Yes [provider]  Multiple Vitamins-Minerals (MULTIVITAMIN WITH MINERALS) tablet Take 1 tablet by mouth daily.   Yes [provider]  vitamin E 200 UNIT capsule Take 200 Units by mouth daily.   Yes [provider]  naproxen (NAPROSYN) 500 MG tablet Take 1 tablet (500 mg total)  by mouth 2 (two) times daily. Patient not taking: Reported on 07/13/2019 08/04/14   Antony Madura, PA-C    Allergies    Ampicillin, Ceclor [cefaclor], Other, Sulfa antibiotics, Tape, Tetracyclines & related, and Tylenol with codeine #3 [acetaminophen-codeine]  Review of Systems   Review of Systems  All other systems reviewed and are negative.   Physical Exam Updated Vital Signs BP 140/76   Pulse (!) 127   Temp 99.5 F (37.5 C)   Resp (!) 22   Ht 1.448 m (4\' 9" )   Wt 43.5 kg   SpO2 96%   BMI 20.73 kg/m   Physical Exam Vitals and nursing note reviewed.  Constitutional:      General: She is not in acute distress.    Appearance: She is well-developed.  HENT:     Head: Normocephalic and atraumatic.     Right Ear:  External ear normal.     Left Ear: External ear normal.  Eyes:     General: No scleral icterus.       Right eye: No discharge.        Left eye: No discharge.     Conjunctiva/sclera: Conjunctivae normal.  Neck:     Trachea: No tracheal deviation.  Cardiovascular:     Rate and Rhythm: Regular rhythm. Tachycardia present.  Pulmonary:     Effort: Pulmonary effort is normal. No respiratory distress.     Breath sounds: Normal breath sounds. No stridor. No wheezing or rales.  Abdominal:     General: Bowel sounds are normal. There is no distension.     Palpations: Abdomen is soft.     Tenderness: There is no abdominal tenderness. There is no guarding or rebound.  Musculoskeletal:        General: No tenderness.     Cervical back: Neck supple.  Skin:    General: Skin is warm and dry.     Findings: No rash.  Neurological:     Mental Status: She is alert.     Cranial Nerves: No cranial nerve deficit (no facial droop, extraocular movements intact, no slurred speech).     Sensory: No sensory deficit.     Motor: No abnormal muscle tone or seizure activity.     Coordination: Coordination normal.     ED Results / Procedures / Treatments   Labs (all labs ordered are listed, but only abnormal results are displayed) Labs Reviewed  BASIC METABOLIC PANEL - Abnormal; Notable for the following components:      Result Value   Sodium 131 (*)    Chloride 92 (*)    Glucose, Bld 177 (*)    All other components within normal limits  CBC - Abnormal; Notable for the following components:   RDW 11.4 (*)    Platelets 416 (*)    All other components within normal limits  D-DIMER, QUANTITATIVE (NOT AT [facility]) - Abnormal; Notable for the following components:   D-Dimer, Quant 3.59 (*)    All other components within normal limits  TROPONIN I (HIGH SENSITIVITY) - Abnormal; Notable for the following components:   Troponin I (High Sensitivity) 22 (*)    All other components within normal limits  SARS  CORONAVIRUS 2 BY RT PCR (HOSPITAL ORDER, PERFORMED IN [facility])  CULTURE, BLOOD (ROUTINE X 2)  CULTURE, BLOOD (ROUTINE X 2)  LACTIC ACID, PLASMA  TROPONIN I (HIGH SENSITIVITY)    EKG EKG Interpretation  Date/Time:  Tuesday Jul 13 2019 15:57:52 EDT Ventricular Rate:  142 PR Interval:  144 QRS Duration: 70 QT Interval:  268 QTC Calculation: 412 R Axis:   59 Text Interpretation: Sinus tachycardia Otherwise normal ECG Since last tracing rate faster Confirmed by Linwood Dibbles [phone]) on 07/13/2019 5:05:05 PM   Radiology DG Chest 2 View  Result Date: 07/13/2019 CLINICAL DATA:  Chest pain, shortness of breath. EXAM: CHEST - 2 VIEW COMPARISON:  October 21, 2014. FINDINGS: The heart size and mediastinal contours are within normal limits. No pneumothorax is noted. Right lung is clear. Mild left pleural effusion is noted with associated left basilar infiltrate or atelectasis. The visualized skeletal structures are unremarkable. IMPRESSION: Mild left pleural effusion is noted with associated left basilar infiltrate or atelectasis. Electronically Signed   By: Lupita Raider M.D.   On: 07/13/2019 16:47   CT Angio Chest PE W and/or Wo Contrast  Result Date: 07/13/2019 CLINICAL DATA:  Shortness of breath with left-sided chest pain. EXAM: CT ANGIOGRAPHY CHEST WITH CONTRAST TECHNIQUE: Multidetector CT imaging of the chest was performed using the standard protocol during bolus administration of intravenous contrast. Multiplanar CT image reconstructions and MIPs were obtained to evaluate the vascular anatomy. CONTRAST:  40mL OMNIPAQUE IOHEXOL 350 MG/ML SOLN COMPARISON:  None. FINDINGS: Cardiovascular: Heart size normal. Small pericardial effusion evident. No thoracic aortic aneurysm. No filling defects within the opacified pulmonary arteries to suggest the presence of an acute pulmonary embolus Mediastinum/Nodes: 11 mm short axis AP window lymph node visible on image 44/5 nodular soft tissue is  identified in the anterior mediastinum, potentially related to small clustered lymph nodes. No right hilar lymphadenopathy. There is abnormal soft tissue attenuation in the left hilum. There is no axillary lymphadenopathy. Lungs/Pleura: Ground-glass opacity is seen fairly diffusely in the left upper lobe with diffuse consolidation of the lingula with airspace consolidation tracking centrally into the left hilum. There is left lower lobe collapse inferiorly. These changes are associated with a moderate left pleural effusion demonstrating loculation in the apex and anterior upper hemithorax. Nodular pleural thickening or loculated fluid is seen along the medial pleura adjacent to the transverse aorta. No right pleural effusion. Upper Abdomen: Unremarkable. Musculoskeletal: No worrisome lytic or sclerotic osseous abnormality. Review of the MIP images confirms the above findings. IMPRESSION: 1. No CT evidence for acute pulmonary embolus. 2. Diffuse ground-glass opacity in the left upper lobe with diffuse consolidation of the lingula tracking centrally into the left hilum. This is associated with a moderate left pleural effusion demonstrating loculation and prominent areas of left lower lobe atelectasis. Nodular pleural thickening or loculated fluid is identified along the medial pleura adjacent to the transverse aorta. Mildly enlarged AP window lymph node with question of soft tissue nodularity/clustered lymph nodes in the anterior mediastinum. These changes may all be related to pneumonia, given the apparent pleural nodularity towards the apex, neoplasm is a distinct concern. Close follow-up recommended. Electronically Signed   By: Kennith Center M.D.   On: 07/13/2019 20:44    Procedures Procedures (including critical care time)  Medications Ordered in ED Medications  sodium chloride 0.9 % bolus 500 mL (500 mLs Intravenous New Bag/Given 07/13/19 1809)    Followed by  0.9 %  sodium chloride infusion (1,000 mLs  Intravenous New Bag/Given 07/13/19 1809)  morphine 4 MG/ML injection 4 mg (has no administration in time range)  sodium chloride 0.9 % bolus 1,000 mL (has no administration in time range)  acetaminophen (TYLENOL) tablet 650 mg (650 mg Oral Given 07/13/19 1604)  acetaminophen (TYLENOL) tablet 650 mg (  650 mg Oral Given 07/13/19 1716)  levofloxacin (LEVAQUIN) IVPB 750 mg (750 mg Intravenous New Bag/Given 07/13/19 1807)  iohexol (OMNIPAQUE) 350 MG/ML injection 75 mL (75 mLs Intravenous Contrast Given 07/13/19 2010)    ED Course  I have reviewed the triage vital signs and the nursing notes.  Pertinent labs & imaging results that were available during my care of the patient were reviewed by me and considered in my medical decision making (see chart for details).  Clinical Course as of Jul 12 2109  Tue Jul 13, 2019  1823 Chest x-ray shows left basilar atelectasis versus infiltrate with effusion suspect pneumonia considering her clinical presentation   [JK]    Clinical Course User Index [JK] Dorie Rank, MD   MDM Rules/Calculators/A&P                      Pt presented to the ED with chest pain fever.  Sinus tachycardia noted on EKG.  Patient's Covid test is negative.  D-dimer is elevated so CT angiogram was performed to evaluate for pulmonary embolism.  CT scan does not show evidence of PE.  Findings are concerning for the possibility of pneumonia but cannot exclude malignancy.  Pleural effusion also noted.  Will need to monitor to see if it improves with treatment.  Parapneumonic effusion likely, less likely empyema. Patient has been started on IV antibiotics.  She remains tachycardic but does not have a lactic acidosis.  No signs of sepsis.  Will give additional fluid boluses now knowing that the Covid test is negative.  I will consult the medical service for admission and further treatment. Final Clinical Impression(s) / ED Diagnoses Final diagnoses:  Community acquired pneumonia, unspecified  laterality       Dorie Rank, MD 07/13/19 2115

## 2019-07-13 NOTE — ED Triage Notes (Signed)
Pt reports being started on meloxicam and steroids for shoulder pain. States she began to have CP and SOB after taking it this morning.

## 2019-07-14 ENCOUNTER — Inpatient Hospital Stay (HOSPITAL_COMMUNITY)

## 2019-07-14 ENCOUNTER — Other Ambulatory Visit (HOSPITAL_COMMUNITY)

## 2019-07-14 DIAGNOSIS — E871 Hypo-osmolality and hyponatremia: Secondary | ICD-10-CM | POA: Diagnosis not present

## 2019-07-14 DIAGNOSIS — E860 Dehydration: Secondary | ICD-10-CM | POA: Diagnosis present

## 2019-07-14 DIAGNOSIS — J869 Pyothorax without fistula: Secondary | ICD-10-CM | POA: Diagnosis not present

## 2019-07-14 DIAGNOSIS — A419 Sepsis, unspecified organism: Secondary | ICD-10-CM | POA: Diagnosis not present

## 2019-07-14 DIAGNOSIS — R000 Tachycardia, unspecified: Secondary | ICD-10-CM | POA: Diagnosis present

## 2019-07-14 DIAGNOSIS — J189 Pneumonia, unspecified organism: Secondary | ICD-10-CM | POA: Diagnosis not present

## 2019-07-14 HISTORY — PX: IR THORACENTESIS ASP PLEURAL SPACE W/IMG GUIDE: IMG5380

## 2019-07-14 LAB — CBC WITH DIFFERENTIAL/PLATELET
Abs Immature Granulocytes: 0.08 10*3/uL — ABNORMAL HIGH (ref 0.00–0.07)
Basophils Absolute: 0.1 10*3/uL (ref 0.0–0.1)
Basophils Relative: 0 %
Eosinophils Absolute: 0 10*3/uL (ref 0.0–0.5)
Eosinophils Relative: 0 %
HCT: 36 % (ref 36.0–46.0)
Hemoglobin: 12 g/dL (ref 12.0–15.0)
Immature Granulocytes: 1 %
Lymphocytes Relative: 2 %
Lymphs Abs: 0.3 10*3/uL — ABNORMAL LOW (ref 0.7–4.0)
MCH: 29.9 pg (ref 26.0–34.0)
MCHC: 33.3 g/dL (ref 30.0–36.0)
MCV: 89.6 fL (ref 80.0–100.0)
Monocytes Absolute: 0.9 10*3/uL (ref 0.1–1.0)
Monocytes Relative: 5 %
Neutro Abs: 15.5 10*3/uL — ABNORMAL HIGH (ref 1.7–7.7)
Neutrophils Relative %: 92 %
Platelets: 390 10*3/uL (ref 150–400)
RBC: 4.02 MIL/uL (ref 3.87–5.11)
RDW: 11.6 % (ref 11.5–15.5)
WBC: 16.8 10*3/uL — ABNORMAL HIGH (ref 4.0–10.5)
nRBC: 0 % (ref 0.0–0.2)

## 2019-07-14 LAB — BODY FLUID CELL COUNT WITH DIFFERENTIAL
Lymphs, Fluid: 5 %
Monocyte-Macrophage-Serous Fluid: 1 % — ABNORMAL LOW (ref 50–90)
Neutrophil Count, Fluid: 94 % — ABNORMAL HIGH (ref 0–25)
Total Nucleated Cell Count, Fluid: 30000 cu mm — ABNORMAL HIGH (ref 0–1000)

## 2019-07-14 LAB — PROCALCITONIN: Procalcitonin: 8.17 ng/mL

## 2019-07-14 LAB — GLUCOSE, PLEURAL OR PERITONEAL FLUID: Glucose, Fluid: <20 mg/dL

## 2019-07-14 LAB — LACTATE DEHYDROGENASE, PLEURAL OR PERITONEAL FLUID: LD, Fluid: 2164 U/L — ABNORMAL HIGH (ref 3–23)

## 2019-07-14 LAB — URINALYSIS, ROUTINE W REFLEX MICROSCOPIC
Bacteria, UA: NONE SEEN
Bilirubin Urine: NEGATIVE
Glucose, UA: NEGATIVE mg/dL
Hgb urine dipstick: NEGATIVE
Ketones, ur: NEGATIVE mg/dL
Nitrite: NEGATIVE
Protein, ur: NEGATIVE mg/dL
Specific Gravity, Urine: 1.02 (ref 1.005–1.030)
pH: 6 (ref 5.0–8.0)

## 2019-07-14 LAB — COMPREHENSIVE METABOLIC PANEL
ALT: 54 U/L — ABNORMAL HIGH (ref 0–44)
AST: 34 U/L (ref 15–41)
Albumin: 2.2 g/dL — ABNORMAL LOW (ref 3.5–5.0)
Alkaline Phosphatase: 140 U/L — ABNORMAL HIGH (ref 38–126)
Anion gap: 10 (ref 5–15)
BUN: 7 mg/dL — ABNORMAL LOW (ref 8–23)
CO2: 23 mmol/L (ref 22–32)
Calcium: 8.5 mg/dL — ABNORMAL LOW (ref 8.9–10.3)
Chloride: 98 mmol/L (ref 98–111)
Creatinine, Ser: 0.65 mg/dL (ref 0.44–1.00)
GFR calc Af Amer: >60 mL/min (ref >60)
GFR calc non Af Amer: >60 mL/min (ref >60)
Glucose, Bld: 126 mg/dL — ABNORMAL HIGH (ref 70–99)
Potassium: 4 mmol/L (ref 3.5–5.1)
Sodium: 131 mmol/L — ABNORMAL LOW (ref 135–145)
Total Bilirubin: 1.6 mg/dL — ABNORMAL HIGH (ref 0.3–1.2)
Total Protein: 6.2 g/dL — ABNORMAL LOW (ref 6.5–8.1)

## 2019-07-14 LAB — RESPIRATORY PANEL BY PCR

## 2019-07-14 LAB — ALBUMIN, PLEURAL OR PERITONEAL FLUID: Albumin, Fluid: 2 g/dL

## 2019-07-14 LAB — MRSA PCR SCREENING: MRSA by PCR: NEGATIVE

## 2019-07-14 LAB — PROTEIN, PLEURAL OR PERITONEAL FLUID: Total protein, fluid: 4.8 g/dL

## 2019-07-14 LAB — HIV ANTIBODY (ROUTINE TESTING W REFLEX): HIV Screen 4th Generation wRfx: NONREACTIVE

## 2019-07-14 LAB — PHOSPHORUS: Phosphorus: 2.7 mg/dL (ref 2.5–4.6)

## 2019-07-14 LAB — GRAM STAIN

## 2019-07-14 LAB — TSH: TSH: 1.156 u[IU]/mL (ref 0.350–4.500)

## 2019-07-14 LAB — MAGNESIUM: Magnesium: 1.5 mg/dL — ABNORMAL LOW (ref 1.7–2.4)

## 2019-07-14 MED ORDER — MAGNESIUM SULFATE 2 GM/50ML IV SOLN
2.0000 g | Freq: Once | INTRAVENOUS | Status: AC
  Administered 2019-07-14: 2 g via INTRAVENOUS
  Filled 2019-07-14: qty 50

## 2019-07-14 MED ORDER — OXYCODONE HCL 5 MG PO TABS
5.0000 mg | ORAL_TABLET | ORAL | Status: DC | PRN
  Administered 2019-07-14 – 2019-07-15 (×2): 10 mg via ORAL
  Filled 2019-07-14 (×2): qty 2

## 2019-07-14 MED ORDER — TRAMADOL HCL 50 MG PO TABS: 50.0000 mg | ORAL_TABLET | Freq: Four times a day (QID) | ORAL | Status: DC | PRN

## 2019-07-14 MED ORDER — FUROSEMIDE 10 MG/ML IJ SOLN: 40.0000 mg | Freq: Four times a day (QID) | INTRAMUSCULAR | Status: DC

## 2019-07-14 MED ORDER — LIDOCAINE HCL 1 % IJ SOLN
INTRAMUSCULAR | Status: AC | PRN
  Administered 2019-07-14: 5 mL

## 2019-07-14 MED ORDER — LIDOCAINE HCL 1 % IJ SOLN
INTRAMUSCULAR | Status: AC
  Filled 2019-07-14: qty 20

## 2019-07-14 MED ORDER — SODIUM CHLORIDE 0.9 % IV SOLN: INTRAVENOUS | Status: DC

## 2019-07-14 MED ORDER — ALBUTEROL SULFATE (2.5 MG/3ML) 0.083% IN NEBU: 2.5000 mg | INHALATION_SOLUTION | Freq: Four times a day (QID) | RESPIRATORY_TRACT | Status: DC | PRN

## 2019-07-14 MED ORDER — SODIUM CHLORIDE 0.9 % IV BOLUS: 500.0000 mL | Freq: Once | INTRAVENOUS | Status: DC

## 2019-07-14 NOTE — Progress Notes (Signed)
PT Cancellation Note  Patient Details Name: Sally Buck MRN: [id number] DOB: 1960-10-31   Cancelled Treatment:    Reason Eval/Treat Not Completed: Other (comment) Pt being transferred up to room from ED. Will follow up as schedule allows.   Farley Ly, PT, DPT  Acute Rehabilitation Services  Pager: [phone] Office: [phone]    Lehman Prom 07/14/2019, 3:24 PM

## 2019-07-14 NOTE — Progress Notes (Signed)
Sally Buck  HBZ:[id number] DOB: [birth year] DOA: 07/13/2019 PCP: Everardo Beals, NP    Brief Narrative:  59 year old with a history of asthma who presented with acute onset of shortness of breath associated with back pain who was also found to be febrile upon her arrival.  She reports a progressively worsening cough over the last few days with associated pleuritic type chest pain.  In the ED she is found to have a temperature of 101 and was tachycardic to 140.  CTa chest noted a probable infiltrate with a loculated pleural effusion.  Significant Events: 5/18 admit via [place] 5/19 thoracentesis in IR  Antimicrobials:  Rocephin 5/18 > Levaquin 5/18  Subjective: The patient is resting comfortably in bed at the time of my exam.  She is alert oriented and quite pleasant.  She states she is feeling better overall.  She denies severe shortness of breath when at rest but does report severe pleuritic type chest pain with coughing sneezing or laughing.  There is no chest pressure.  There is no hemoptysis or significant cough.  Assessment & Plan:  Extensive left lung pneumonia complicated by empyema Continue empiric antibiotic therapy -thoracentesis attempted by IR greatly limited due to loculations -suspect VATS will be necessary -pulmonary has evaluated and does not feel there is a role for chest tube with lytic therapy -thoracic surgery to evaluate  Sepsis due to pneumonia Hydrate -continue empiric antibiotic  Hyponatremia Due to above -follow with hydration -may simply be primarily due to significant parenchymal lung disease  Hypomagnesemia Supplement and follow  Asthma Well compensated at present  DVT prophylaxis: SCDs Code Status: FULL CODE Family Communication: Spoke with patient and daughter at bedside at length Status is: Inpatient  Remains inpatient appropriate because:Ongoing diagnostic testing needed not appropriate for outpatient work up   Dispo: The  patient is from: Home              Anticipated d/c is to: Home              Anticipated d/c date is: 3 days              Patient currently is not medically stable to d/c.   Consultants:  IR PCCM Thoracic Surgery  Objective: Blood pressure (!) 157/82, pulse (!) 135, temperature 99.5 F (37.5 C), resp. rate (!) 38, height 4\' 9"  (1.448 m), weight 43.5 kg, SpO2 93 %. No intake or output data in the 24 hours ending 07/14/19 0909 Filed Weights   07/13/19 1558  Weight: 43.5 kg    Examination: General: No acute respiratory distress Lungs: Poor breath sounds throughout all left chest with no active wheezing with good air movement on right with no focal right crackles Cardiovascular: Tachycardic but regular with no murmur or rub appreciable Abdomen: Nontender, nondistended, soft, bowel sounds positive, no rebound, no ascites, no appreciable mass Extremities: No significant cyanosis, clubbing, or edema bilateral lower extremities  CBC: Recent Labs  Lab 07/13/19 1608 07/14/19 0517  WBC 8.6 16.8*  NEUTROABS  --  15.5*  HGB 12.8 12.0  HCT 38.8 36.0  MCV 89.0 89.6  PLT 416* 101   Basic Metabolic Panel: Recent Labs  Lab 07/13/19 1608 07/14/19 0517  NA 131* 131*  K 4.3 4.0  CL 92* 98  CO2 24 23  GLUCOSE 177* 126*  BUN 12 7*  CREATININE 0.68 0.65  CALCIUM 9.3 8.5*  MG  --  1.5*  PHOS  --  2.7   GFR: Estimated Creatinine  Clearance: 45 mL/min (by C-G formula based on SCr of 0.65 mg/dL).  Liver Function Tests: Recent Labs  Lab 07/14/19 0517  AST 34  ALT 54*  ALKPHOS 140*  BILITOT 1.6*  PROT 6.2*  ALBUMIN 2.2*   No results for input(s): LIPASE, AMYLASE in the last 168 hours. No results for input(s): AMMONIA in the last 168 hours.  Coagulation Profile: No results for input(s): INR, PROTIME in the last 168 hours.  Cardiac Enzymes: No results for input(s): CKTOTAL, CKMB, CKMBINDEX, TROPONINI in the last 168 hours.  HbA1C: No results found for: HGBA1C  CBG: No  results for input(s): GLUCAP in the last 168 hours.  Recent Results (from the past 240 hour(s))  Culture, blood (Routine X 2) w Reflex to ID Panel     Status: None (Preliminary result)   Collection Time: 07/13/19  5:30 PM   Specimen: BLOOD RIGHT FOREARM  Result Value Ref Range Status   Specimen Description BLOOD RIGHT FOREARM  Final   Special Requests   Final    BOTTLES DRAWN AEROBIC AND ANAEROBIC Blood Culture adequate volume   Culture   Final    NO GROWTH < 24 HOURS Performed at [facility], [address], [place], [place] [address]    Report Status PENDING  Incomplete  Culture, blood (Routine X 2) w Reflex to ID Panel     Status: None (Preliminary result)   Collection Time: 07/13/19  5:30 PM   Specimen: BLOOD  Result Value Ref Range Status   Specimen Description BLOOD LEFT ANTECUBITAL  Final   Special Requests   Final    BOTTLES DRAWN AEROBIC AND ANAEROBIC Blood Culture results may not be optimal due to an inadequate volume of blood received in culture bottles   Culture   Final    NO GROWTH < 24 HOURS Performed at [facility], [address], [place], [place] [address]    Report Status PENDING  Incomplete  SARS Coronavirus 2 by RT PCR (hospital order, performed in [facility] hospital lab) Nasopharyngeal Nasopharyngeal Swab     Status: None   Collection Time: 07/13/19  5:41 PM   Specimen: Nasopharyngeal Swab  Result Value Ref Range Status   SARS Coronavirus 2 NEGATIVE NEGATIVE Final    Comment: (NOTE) SARS-CoV-2 target nucleic acids are NOT DETECTED. The SARS-CoV-2 RNA is generally detectable in upper and lower respiratory specimens during the acute phase of infection. The lowest concentration of SARS-CoV-2 viral copies this assay can detect is 250 copies / mL. A negative result does not preclude SARS-CoV-2 infection and should not be used as the sole basis for treatment or other patient management decisions.  A negative result may occur with improper  specimen collection / handling, submission of specimen other than nasopharyngeal swab, presence of viral mutation(s) within the areas targeted by this assay, and inadequate number of viral copies (<250 copies / mL). A negative result must be combined with clinical observations, patient history, and epidemiological information. Fact Sheet for Patients:   BoilerBrush.com.cy Fact Sheet for Healthcare Providers: https://pope.com/ This test is not yet approved or cleared  by the [place] FDA and has been authorized for detection and/or diagnosis of SARS-CoV-2 by FDA under an Emergency Use Authorization (EUA).  This EUA will remain in effect (meaning this test can be used) for the duration of the COVID-19 declaration under Section 564(b)(1) of the Act, 21 U.S.C. section 360bbb-3(b)(1), unless the authorization is terminated or revoked sooner. Performed at [facility], [address] [place], [place] [address]      Scheduled Meds: . docusate sodium  100 mg Oral BID  . latanoprost  1 drop Both Eyes QHS  . lidocaine      . loratadine  10 mg Oral Daily  . mometasone-formoterol  2 puff Inhalation BID  . sodium chloride flush  3 mL Intravenous Q12H   Continuous Infusions: . sodium chloride Stopped (07/14/19 0138)  . sodium chloride 75 mL/hr at 07/14/19 0138  . azithromycin    . cefTRIAXone (ROCEPHIN)  IV       LOS: 1 day   Lonia Blood, MD [facility] Office  [phone] Pager - Text Page per Amion  If 7PM-7AM, please contact night-coverage per Amion 07/14/2019, 9:09 AM

## 2019-07-14 NOTE — Consult Note (Addendum)
NAME:  Sally Buck, MRN:  [id number], DOB:  [birth year], LOS: 1 ADMISSION DATE:  07/13/2019, CONSULTATION DATE:  07/14/2019 REFERRING MD:  Virgina Evener CHIEF COMPLAINT:  Loculated em   Brief History   59yo female presented with complaints of chest pain, left shoulder pain, and shortness of breath, chest CT on admission significant for loculated pleural effusion vs empyema.   History of present illness   Sally Buck is a 59yo female with past medical history of recurrent pneumonia, GERD, and Hyperlipidemia who presented with a chief complaint of shortness of breath with associated chest and shoulder pain. She reports she developed shoulder pain about 2 weeks ago for which she was prescribed NSAIDs and steroids. When the pain did not improve and she began experiencing shortness of breath and chest pain she presented to the ED for further evaluation. She reports some chills at home and night sweats "when I use my heating blanket" but denies any fever, sick contact, abdominal pain, nausea, vomiting, diarrhea, or lower extremity swelling.   Patient reports she has had multiple episodes of pneumonia in the past but "never this bad". She estimated she has have 3-4 episodes of pneumonia over a 5-58yr time span. She denies any signs of dysphagia, unintentional weight loss, or dental concerns.    On admission patient was seen febrile, tachypneic, tachycardic, and mildly hyerpertnesion but she was not hypoxic on arrival. Lab work on arrival significant for mild hyponatremia and mild hyperglycemia. Imagine on admission included chest xray which revealed left pleural effusion. Chest CT was then obtained that revealed significant for loculated pleural effusion vs empyema. At that time IR was consulted and preformed US guided left thoracentesis that yielded of cloudy thick purulent-appearing fluid. Post thoracentesis patient continues to remain febrile with new onset leukocytosis.   PCCM  consulted for further assistance   Past Medical History  Recurrent pneumonia  Asthma Seasonal allergies  Hyperlipidemia  GERD   Significant Hospital Events   Admitted 5/19   Consults:  IR  PCCM   Procedures:  Thoracentesis by IR 5/19  yielded of cloudy thick purulent-appearing fluid  Significant Diagnostic Tests:  Chest xray 5/18 Mild left pleural effusion is noted with associated left basilar infiltrate or atelectasis.  Chest CT 5/18 1. No CT evidence for acute pulmonary embolus. 2. Diffuse ground-glass opacity in the left upper lobe with diffuse consolidation of the lingula tracking centrally into the left hilum. This is associated with a moderate left pleural effusion demonstrating loculation and prominent areas of left lower lobe atelectasis. Nodular pleural thickening or loculated fluid is identified along the medial pleura adjacent to the transverse aorta. Mildly enlarged AP window lymph node with question of soft tissue nodularity/clustered lymph nodes in the anterior mediastinum. These changes may all be related to pneumonia, given the apparent pleural nodularity towards the apex, neoplasm is a distinct concern. Close follow-up recommended.  Chest xray  5/19  No pneumothorax following thoracentesis. There are however changes suggestive of re-expansion edema.  Micro Data:  COVID 5/18 > negative  Blood culture 5/18 > Pleural fluid culture 5/19 > Gram stain left pleural fluid 5/19 > MRSA PCR 5/19 > negative RVP 5/19 > negative   Antimicrobials:  Azithromycin 5/19 Ceftriaxone 5/19  Interim history/subjective:  Lying on ED stretcher with continued complaints of chest and shoulder pain that is exacerbated with cough and deep breathing,   Objective   Blood pressure (!) 160/82, pulse (!) 128, temperature (!) 102.9 F (39.4 C), temperature source  Oral, resp. rate (!) 30, height 4\' 9"  (1.448 m), weight 43.5 kg, SpO2 96 %.       No intake or output  data in the 24 hours ending 07/14/19 1020 Filed Weights   07/13/19 1558  Weight: 43.5 kg    Examination: General: Very pleasant adult female lying on ED stretcher in no acute distress HEENT: [place]/AT, MM pink/moist, PERRL, Sclera non-icteric  Neuro: Alert and oriented x3, non-focal  CV: s1s2 regular rate and rhythm, no murmur, rubs, or gallops,  PULM:  Clear to right with crackles and rhonchi to left, tachypnea, oxygen saturations 93-97 on 2L [place] GI: soft, bowel sounds active in all 4 quadrants, non-tender, non-distended Extremities: warm/dry, no edema  Skin: no rashes or lesions  Resolved Hospital Problem list     Assessment & Plan:  Sally Buck is a 59yo female who presented with complaint of shortness of breath with associated chest and shoulder pain who was found to have CAP with loculated pleural effusion.   Assessment  Sepsis  Community acquired pneumonia  Left empyema  Hx of asthma   Plan Continue IV antibiotics  Follow cultures  Diuretics for reexpansion pleural edema  Continue supplemental oxygen  Encourage frequent pulmonary hygiene  Adequate pain control to avoid splinting  Can utilize CPAP for respiratory distress/fatigue  Recommend CTS surgery consult given presence of multiple loculated pockets  Continue bronchodilators  Could consider placement of pigtail chest tube with use of lytic as well, will discuss with attending   Best practice:  Diet: Regular diet  Pain/Anxiety/Delirium protocol (if indicated): PRNs VAP protocol (if indicated): N/A DVT prophylaxis: SCD GI prophylaxis: PPI Glucose control: Monitor  Mobility: Up as tolerated  Code Status: Full Family Communication: Family updated at bedside  Disposition: Progressive care   Labs   CBC: Recent Labs  Lab 07/13/19 1608 07/14/19 0517  WBC 8.6 16.8*  NEUTROABS  --  15.5*  HGB 12.8 12.0  HCT 38.8 36.0  MCV 89.0 89.6  PLT 416* 390    Basic Metabolic Panel: Recent Labs  Lab 07/13/19  1608 07/14/19 0517  NA 131* 131*  K 4.3 4.0  CL 92* 98  CO2 24 23  GLUCOSE 177* 126*  BUN 12 7*  CREATININE 0.68 0.65  CALCIUM 9.3 8.5*  MG  --  1.5*  PHOS  --  2.7   GFR: Estimated Creatinine Clearance: 45 mL/min (by C-G formula based on SCr of 0.65 mg/dL). Recent Labs  Lab 07/13/19 1608 07/13/19 1730 07/14/19 0517  PROCALCITON  --   --  8.17  WBC 8.6  --  16.8*  LATICACIDVEN  --  1.2  --     Liver Function Tests: Recent Labs  Lab 07/14/19 0517  AST 34  ALT 54*  ALKPHOS 140*  BILITOT 1.6*  PROT 6.2*  ALBUMIN 2.2*   No results for input(s): LIPASE, AMYLASE in the last 168 hours. No results for input(s): AMMONIA in the last 168 hours.  ABG No results found for: PHART, PCO2ART, PO2ART, HCO3, TCO2, ACIDBASEDEF, O2SAT   Coagulation Profile: No results for input(s): INR, PROTIME in the last 168 hours.  Cardiac Enzymes: No results for input(s): CKTOTAL, CKMB, CKMBINDEX, TROPONINI in the last 168 hours.  HbA1C: No results found for: HGBA1C  CBG: No results for input(s): GLUCAP in the last 168 hours.  Review of Systems: Positive in bold   Gen: Denies fever, chills, weight change, fatigue, night sweats HEENT: Denies blurred vision, double vision, hearing loss, tinnitus, sinus congestion,  rhinorrhea, sore throat, neck stiffness, dysphagia PULM: Denies shortness of breath, cough, sputum production, hemoptysis, wheezing CV: Denies chest pain, edema, orthopnea, paroxysmal nocturnal dyspnea, palpitations GI: Denies abdominal pain, nausea, vomiting, diarrhea, hematochezia, melena, constipation, change in bowel habits GU: Denies dysuria, hematuria, polyuria, oliguria, urethral discharge Endocrine: Denies hot or cold intolerance, polyuria, polyphagia or appetite change Derm: Denies rash, dry skin, scaling or peeling skin change Heme: Denies easy bruising, bleeding, bleeding gums Neuro: Denies headache, numbness, weakness, slurred speech, loss of memory or consciousness   Past Medical History  She,  has a past medical history of Asthma, GERD (gastroesophageal reflux disease), and High cholesterol.   Surgical History    Past Surgical History:  Procedure Laterality Date  . ABDOMINAL HYSTERECTOMY    . ANKLE SURGERY    . APPENDECTOMY    . EYE SURGERY       Social History   reports that she has quit smoking. She has never used smokeless tobacco. She reports that she does not drink alcohol or use drugs.   Family History   Her family history includes CAD in her mother; Diabetes in her brother and sister.   Allergies Allergies  Allergen Reactions  . Ampicillin Itching  . Ceclor [Cefaclor] Other (See Comments)    Passed out   . Other Other (See Comments)    travoprost : unknown  . Sulfa Antibiotics   . Tape Itching  . Tetracyclines & Related Itching  . Tylenol With Codeine #3 [Acetaminophen-Codeine] Other (See Comments)    Passed out      Home Medications  Prior to Admission medications   Medication Sig Start Date End Date Taking? Authorizing Provider  albuterol (PROVENTIL HFA;VENTOLIN HFA) 108 (90 BASE) MCG/ACT inhaler Inhale 2 puffs into the lungs every 6 (six) hours as needed for wheezing or shortness of breath.   Yes [provider]  Ascorbic Acid (VITAMIN C GUMMIE PO) Take 2 tablets by mouth daily.   Yes [provider]  cetirizine (ZYRTEC) 10 MG tablet Take 10 mg by mouth at bedtime.    Yes [provider]  Cholecalciferol (VITAMIN D) 1000 UNITS capsule Take 1,000 Units by mouth daily.   Yes [provider]  cyclobenzaprine (FLEXERIL) 5 MG tablet Take 5 mg by mouth 3 (three) times daily as needed for muscle spasms.   Yes [provider]  fluticasone (FLONASE) 50 MCG/ACT nasal spray Place 1 spray into both nostrils daily.   Yes [provider]  Fluticasone-Salmeterol (ADVAIR) 250-50 MCG/DOSE AEPB Inhale 1 puff into the lungs 2 (two) times daily.   Yes [provider]  latanoprost  (XALATAN) 0.005 % ophthalmic solution Place 1 drop into both eyes at bedtime.  02/23/19  Yes [provider]  meloxicam (MOBIC) 15 MG tablet Take 15 mg by mouth daily. 07/12/19  Yes [provider]  methylPREDNISolone (MEDROL DOSEPAK) 4 MG TBPK tablet Take 12 mg by mouth See admin instructions. Medrol dose pack : Take 6 tablets by mouth on day 1 then 5 tablet on day 2, 4 tablets on day 3, 3 tablets on day 4, 2 tablets on day 5 and 1 tablet on day 6. 07/12/19  Yes [provider]  Multiple Vitamins-Minerals (MULTIVITAMIN WITH MINERALS) tablet Take 1 tablet by mouth daily.   Yes [provider]  vitamin E 200 UNIT capsule Take 200 Units by mouth daily.   Yes [provider]  naproxen (NAPROSYN) 500 MG tablet Take 1 tablet (500 mg total) by mouth  2 (two) times daily. Patient not taking: Reported on 07/13/2019 08/04/14   Antony Madura, PA-C     Signature:  Delfin Gant, NP-C [facility] Contact / Pager information can be found on Amion  07/14/2019, 11:16 AM

## 2019-07-14 NOTE — ED Notes (Signed)
Pt tachypneic, O2 sat 93%.Placed pt on 2L [place] for comfort. Reports ease with breathing.

## 2019-07-14 NOTE — Procedures (Signed)
PROCEDURE SUMMARY:  Successful US guided diagnostic and therapeutic left thoracentesis. Yielded 250 mL of cloudy, thick, purulent-appearing fluid. Pt tolerated procedure well. No immediate complications.  Specimen was sent for labs. CXR ordered.  EBL < 5 mL  Hoyt Koch PA-C 07/14/2019 8:46 AM

## 2019-07-14 NOTE — ED Notes (Signed)
Lunch Tray Ordered @ 1040. 

## 2019-07-14 NOTE — ED Notes (Signed)
Pt transported to IR 

## 2019-07-14 NOTE — Progress Notes (Signed)
  Echocardiogram 2D Echocardiogram has been attempted. Patient leaving floor. Will reattempt at later time.  Dorena Dew Jakelyn Squyres 07/14/2019, 3:14 PM

## 2019-07-15 ENCOUNTER — Inpatient Hospital Stay (HOSPITAL_COMMUNITY): Admitting: Certified Registered Nurse Anesthetist

## 2019-07-15 ENCOUNTER — Inpatient Hospital Stay (HOSPITAL_COMMUNITY)

## 2019-07-15 ENCOUNTER — Encounter (HOSPITAL_COMMUNITY)
Admission: EM | Disposition: A | Discharge status: Home / Self Care | Payer: Self-pay | Source: Home / Self Care | Attending: Internal Medicine

## 2019-07-15 DIAGNOSIS — R7989 Other specified abnormal findings of blood chemistry: Secondary | ICD-10-CM

## 2019-07-15 DIAGNOSIS — J869 Pyothorax without fistula: Secondary | ICD-10-CM

## 2019-07-15 DIAGNOSIS — R0602 Shortness of breath: Secondary | ICD-10-CM

## 2019-07-15 DIAGNOSIS — E871 Hypo-osmolality and hyponatremia: Secondary | ICD-10-CM | POA: Diagnosis not present

## 2019-07-15 DIAGNOSIS — A419 Sepsis, unspecified organism: Secondary | ICD-10-CM | POA: Diagnosis not present

## 2019-07-15 HISTORY — PX: VIDEO ASSISTED THORACOSCOPY (VATS)/DECORTICATION: SHX6171

## 2019-07-15 LAB — COMPREHENSIVE METABOLIC PANEL
ALT: 36 U/L (ref 0–44)
ALT: 34 U/L (ref 0–44)
AST: 23 U/L (ref 15–41)
AST: 29 U/L (ref 15–41)
Albumin: 2.1 g/dL — ABNORMAL LOW (ref 3.5–5.0)
Albumin: 1.7 g/dL — ABNORMAL LOW (ref 3.5–5.0)
Alkaline Phosphatase: 144 U/L — ABNORMAL HIGH (ref 38–126)
Alkaline Phosphatase: 135 U/L — ABNORMAL HIGH (ref 38–126)
Anion gap: 12 (ref 5–15)
Anion gap: 9 (ref 5–15)
BUN: 9 mg/dL (ref 8–23)
BUN: 10 mg/dL (ref 8–23)
CO2: 21 mmol/L — ABNORMAL LOW (ref 22–32)
CO2: 24 mmol/L (ref 22–32)
Calcium: 8.4 mg/dL — ABNORMAL LOW (ref 8.9–10.3)
Calcium: 8.1 mg/dL — ABNORMAL LOW (ref 8.9–10.3)
Chloride: 99 mmol/L (ref 98–111)
Chloride: 100 mmol/L (ref 98–111)
Creatinine, Ser: 0.72 mg/dL (ref 0.44–1.00)
Creatinine, Ser: 0.64 mg/dL (ref 0.44–1.00)
GFR calc Af Amer: >60 mL/min (ref >60)
GFR calc Af Amer: >60 mL/min (ref >60)
GFR calc non Af Amer: >60 mL/min (ref >60)
GFR calc non Af Amer: >60 mL/min (ref >60)
Glucose, Bld: 123 mg/dL — ABNORMAL HIGH (ref 70–99)
Glucose, Bld: 154 mg/dL — ABNORMAL HIGH (ref 70–99)
Potassium: 3.7 mmol/L (ref 3.5–5.1)
Potassium: 4.1 mmol/L (ref 3.5–5.1)
Sodium: 132 mmol/L — ABNORMAL LOW (ref 135–145)
Sodium: 133 mmol/L — ABNORMAL LOW (ref 135–145)
Total Bilirubin: 1.3 mg/dL — ABNORMAL HIGH (ref 0.3–1.2)
Total Bilirubin: 0.9 mg/dL (ref 0.3–1.2)
Total Protein: 5.9 g/dL — ABNORMAL LOW (ref 6.5–8.1)
Total Protein: 5.2 g/dL — ABNORMAL LOW (ref 6.5–8.1)

## 2019-07-15 LAB — BLOOD GAS, ARTERIAL
Acid-base deficit: 0.9 mmol/L (ref 0.0–2.0)
Bicarbonate: 24 mmol/L (ref 20.0–28.0)
Drawn by: 519031
FIO2: 36
O2 Saturation: 87.4 %
Patient temperature: 36.7
pCO2 arterial: 44.4 mmHg (ref 32.0–48.0)
pH, Arterial: 7.35 (ref 7.350–7.450)
pO2, Arterial: 54.9 mmHg — ABNORMAL LOW (ref 83.0–108.0)

## 2019-07-15 LAB — CBC
HCT: 37.3 % (ref 36.0–46.0)
HCT: 35 % — ABNORMAL LOW (ref 36.0–46.0)
Hemoglobin: 12.6 g/dL (ref 12.0–15.0)
Hemoglobin: 11.7 g/dL — ABNORMAL LOW (ref 12.0–15.0)
MCH: 30.6 pg (ref 26.0–34.0)
MCH: 30.2 pg (ref 26.0–34.0)
MCHC: 33.8 g/dL (ref 30.0–36.0)
MCHC: 33.4 g/dL (ref 30.0–36.0)
MCV: 90.5 fL (ref 80.0–100.0)
MCV: 90.2 fL (ref 80.0–100.0)
Platelets: 415 10*3/uL — ABNORMAL HIGH (ref 150–400)
Platelets: 399 10*3/uL (ref 150–400)
RBC: 4.12 MIL/uL (ref 3.87–5.11)
RBC: 3.88 MIL/uL (ref 3.87–5.11)
RDW: 11.9 % (ref 11.5–15.5)
RDW: 12 % (ref 11.5–15.5)
WBC: 32.4 10*3/uL — ABNORMAL HIGH (ref 4.0–10.5)
WBC: 37.6 10*3/uL — ABNORMAL HIGH (ref 4.0–10.5)
nRBC: 0 % (ref 0.0–0.2)
nRBC: 0 % (ref 0.0–0.2)

## 2019-07-15 LAB — PH, BODY FLUID: pH, Body Fluid: 6.8

## 2019-07-15 LAB — ECHOCARDIOGRAM COMPLETE
Height: 57 in
Weight: 1532.8 oz

## 2019-07-15 LAB — PROTIME-INR
INR: 1.3 — ABNORMAL HIGH (ref 0.8–1.2)
INR: 1.3 — ABNORMAL HIGH (ref 0.8–1.2)
Prothrombin Time: 15.6 seconds — ABNORMAL HIGH (ref 11.4–15.2)
Prothrombin Time: 15.8 seconds — ABNORMAL HIGH (ref 11.4–15.2)

## 2019-07-15 LAB — APTT: aPTT: 36 seconds (ref 24–36)

## 2019-07-15 LAB — MAGNESIUM: Magnesium: 2.1 mg/dL (ref 1.7–2.4)

## 2019-07-15 LAB — ABO/RH: ABO/RH(D): B POS

## 2019-07-15 LAB — TYPE AND SCREEN
ABO/RH(D): B POS
Antibody Screen: NEGATIVE

## 2019-07-15 LAB — CYTOLOGY - NON PAP

## 2019-07-15 SURGERY — VIDEO ASSISTED THORACOSCOPY (VATS)/DECORTICATION
Anesthesia: General | Site: Chest | Laterality: Left

## 2019-07-15 MED ORDER — FENTANYL 50 MCG/ML IV PCA SOLN
INTRAVENOUS | Status: AC
  Filled 2019-07-15: qty 20

## 2019-07-15 MED ORDER — METRONIDAZOLE IN NACL 5-0.79 MG/ML-% IV SOLN
500.0000 mg | Freq: Three times a day (TID) | INTRAVENOUS | Status: DC
  Administered 2019-07-15 – 2019-07-17 (×7): 500 mg via INTRAVENOUS
  Filled 2019-07-15 (×7): qty 100

## 2019-07-15 MED ORDER — IPRATROPIUM-ALBUTEROL 0.5-2.5 (3) MG/3ML IN SOLN: 3.0000 mL | Freq: Four times a day (QID) | RESPIRATORY_TRACT | Status: DC | PRN

## 2019-07-15 MED ORDER — PHENYLEPHRINE 40 MCG/ML (10ML) SYRINGE FOR IV PUSH (FOR BLOOD PRESSURE SUPPORT)
PREFILLED_SYRINGE | INTRAVENOUS | Status: AC
  Filled 2019-07-15: qty 10

## 2019-07-15 MED ORDER — BUPIVACAINE LIPOSOME 1.3 % IJ SUSP
INTRAMUSCULAR | Status: DC | PRN
  Administered 2019-07-15: 30 mL

## 2019-07-15 MED ORDER — DIPHENHYDRAMINE HCL 50 MG/ML IJ SOLN: 12.5000 mg | Freq: Four times a day (QID) | INTRAMUSCULAR | Status: DC | PRN

## 2019-07-15 MED ORDER — BISACODYL 5 MG PO TBEC
10.0000 mg | DELAYED_RELEASE_TABLET | Freq: Every day | ORAL | Status: DC
  Administered 2019-07-15 – 2019-07-22 (×5): 10 mg via ORAL
  Filled 2019-07-15 (×7): qty 2

## 2019-07-15 MED ORDER — SODIUM CHLORIDE 0.45 % IV SOLN: INTRAVENOUS | Status: DC

## 2019-07-15 MED ORDER — NALOXONE HCL 0.4 MG/ML IJ SOLN: 0.4000 mg | INTRAMUSCULAR | Status: DC | PRN

## 2019-07-15 MED ORDER — ONDANSETRON HCL 4 MG/2ML IJ SOLN: 4.0000 mg | Freq: Four times a day (QID) | INTRAMUSCULAR | Status: DC | PRN

## 2019-07-15 MED ORDER — ENOXAPARIN SODIUM 40 MG/0.4ML ~~LOC~~ SOLN
40.0000 mg | Freq: Every day | SUBCUTANEOUS | Status: DC
  Administered 2019-07-16 – 2019-07-22 (×7): 40 mg via SUBCUTANEOUS
  Filled 2019-07-15 (×7): qty 0.4

## 2019-07-15 MED ORDER — HYDROMORPHONE HCL 1 MG/ML IJ SOLN
0.2500 mg | INTRAMUSCULAR | Status: DC | PRN
  Administered 2019-07-15 (×2): 0.5 mg via INTRAVENOUS

## 2019-07-15 MED ORDER — ROCURONIUM BROMIDE 10 MG/ML (PF) SYRINGE
PREFILLED_SYRINGE | INTRAVENOUS | Status: AC
  Filled 2019-07-15: qty 10

## 2019-07-15 MED ORDER — ACETAMINOPHEN 160 MG/5ML PO SOLN
1000.0000 mg | Freq: Four times a day (QID) | ORAL | Status: AC
  Administered 2019-07-19: 1000 mg via ORAL
  Filled 2019-07-15: qty 40.6

## 2019-07-15 MED ORDER — PROPOFOL 10 MG/ML IV BOLUS
INTRAVENOUS | Status: DC | PRN
  Administered 2019-07-15: 90 mg via INTRAVENOUS
  Administered 2019-07-15: 30 mg via INTRAVENOUS

## 2019-07-15 MED ORDER — SUCCINYLCHOLINE CHLORIDE 200 MG/10ML IV SOSY
PREFILLED_SYRINGE | INTRAVENOUS | Status: AC
  Filled 2019-07-15: qty 10

## 2019-07-15 MED ORDER — WHITE PETROLATUM EX OINT
TOPICAL_OINTMENT | CUTANEOUS | Status: AC
  Filled 2019-07-15: qty 28.35

## 2019-07-15 MED ORDER — BUPIVACAINE LIPOSOME 1.3 % IJ SUSP
20.0000 mL | Freq: Once | INTRAMUSCULAR | Status: DC
  Filled 2019-07-15: qty 20

## 2019-07-15 MED ORDER — FENTANYL CITRATE (PF) 250 MCG/5ML IJ SOLN
INTRAMUSCULAR | Status: DC | PRN
  Administered 2019-07-15: 25 ug via INTRAVENOUS
  Administered 2019-07-15 (×2): 50 ug via INTRAVENOUS
  Administered 2019-07-15: 75 ug via INTRAVENOUS
  Administered 2019-07-15: 50 ug via INTRAVENOUS

## 2019-07-15 MED ORDER — ORAL CARE MOUTH RINSE
15.0000 mL | Freq: Two times a day (BID) | OROMUCOSAL | Status: DC
  Administered 2019-07-16 – 2019-07-22 (×11): 15 mL via OROMUCOSAL

## 2019-07-15 MED ORDER — METOCLOPRAMIDE HCL 5 MG/ML IJ SOLN
10.0000 mg | Freq: Four times a day (QID) | INTRAMUSCULAR | Status: AC
  Administered 2019-07-15 – 2019-07-16 (×4): 10 mg via INTRAVENOUS
  Filled 2019-07-15 (×4): qty 2

## 2019-07-15 MED ORDER — PHENYLEPHRINE 40 MCG/ML (10ML) SYRINGE FOR IV PUSH (FOR BLOOD PRESSURE SUPPORT)
PREFILLED_SYRINGE | INTRAVENOUS | Status: DC | PRN
  Administered 2019-07-15: 80 ug via INTRAVENOUS
  Administered 2019-07-15: 120 ug via INTRAVENOUS

## 2019-07-15 MED ORDER — SENNOSIDES-DOCUSATE SODIUM 8.6-50 MG PO TABS
1.0000 | ORAL_TABLET | Freq: Every day | ORAL | Status: DC
  Administered 2019-07-15 – 2019-07-18 (×4): 1 via ORAL
  Filled 2019-07-15 (×5): qty 1

## 2019-07-15 MED ORDER — CEFAZOLIN SODIUM-DEXTROSE 2-4 GM/100ML-% IV SOLN: 2.0000 g | INTRAVENOUS | Status: DC

## 2019-07-15 MED ORDER — SODIUM CHLORIDE 0.9% FLUSH: 9.0000 mL | INTRAVENOUS | Status: DC | PRN

## 2019-07-15 MED ORDER — DEXAMETHASONE SODIUM PHOSPHATE 10 MG/ML IJ SOLN
INTRAMUSCULAR | Status: DC | PRN
  Administered 2019-07-15: 5 mg via INTRAVENOUS

## 2019-07-15 MED ORDER — LIDOCAINE 2% (20 MG/ML) 5 ML SYRINGE
INTRAMUSCULAR | Status: AC
  Filled 2019-07-15: qty 5

## 2019-07-15 MED ORDER — 0.9 % SODIUM CHLORIDE (POUR BTL) OPTIME
TOPICAL | Status: DC | PRN
  Administered 2019-07-15: 1000 mL

## 2019-07-15 MED ORDER — FENTANYL CITRATE (PF) 250 MCG/5ML IJ SOLN
INTRAMUSCULAR | Status: AC
  Filled 2019-07-15: qty 5

## 2019-07-15 MED ORDER — ACETAMINOPHEN 500 MG PO TABS
1000.0000 mg | ORAL_TABLET | Freq: Four times a day (QID) | ORAL | Status: AC
  Administered 2019-07-15 – 2019-07-20 (×16): 1000 mg via ORAL
  Filled 2019-07-15 (×16): qty 2

## 2019-07-15 MED ORDER — ROCURONIUM BROMIDE 10 MG/ML (PF) SYRINGE
PREFILLED_SYRINGE | INTRAVENOUS | Status: DC | PRN
  Administered 2019-07-15: 20 mg via INTRAVENOUS
  Administered 2019-07-15: 30 mg via INTRAVENOUS

## 2019-07-15 MED ORDER — CEFAZOLIN SODIUM 1 G IJ SOLR
INTRAMUSCULAR | Status: AC
  Filled 2019-07-15: qty 10

## 2019-07-15 MED ORDER — HEPARIN SODIUM (PORCINE) 5000 UNIT/ML IJ SOLN
5000.0000 [IU] | Freq: Three times a day (TID) | INTRAMUSCULAR | Status: DC
  Administered 2019-07-15: 5000 [IU] via SUBCUTANEOUS
  Filled 2019-07-15: qty 1

## 2019-07-15 MED ORDER — MEPERIDINE HCL 25 MG/ML IJ SOLN: 6.2500 mg | INTRAMUSCULAR | Status: DC | PRN

## 2019-07-15 MED ORDER — OXYCODONE HCL 5 MG PO TABS
5.0000 mg | ORAL_TABLET | ORAL | Status: DC | PRN
  Filled 2019-07-15: qty 2

## 2019-07-15 MED ORDER — MIDAZOLAM HCL 2 MG/2ML IJ SOLN
INTRAMUSCULAR | Status: AC
  Filled 2019-07-15: qty 2

## 2019-07-15 MED ORDER — DEXAMETHASONE SODIUM PHOSPHATE 10 MG/ML IJ SOLN
INTRAMUSCULAR | Status: AC
  Filled 2019-07-15: qty 1

## 2019-07-15 MED ORDER — LIDOCAINE 2% (20 MG/ML) 5 ML SYRINGE
INTRAMUSCULAR | Status: DC | PRN
  Administered 2019-07-15: 60 mg via INTRAVENOUS

## 2019-07-15 MED ORDER — FENTANYL 50 MCG/ML IV PCA SOLN
INTRAVENOUS | Status: DC
  Administered 2019-07-15: 180 ug via INTRAVENOUS
  Administered 2019-07-15: 15 ug via INTRAVENOUS
  Administered 2019-07-16: 0 ug via INTRAVENOUS
  Administered 2019-07-16: 30 ug via INTRAVENOUS
  Administered 2019-07-18: 45 ug via INTRAVENOUS
  Administered 2019-07-18: 180 ug via INTRAVENOUS
  Administered 2019-07-18: 60 ug via INTRAVENOUS
  Administered 2019-07-18: 195 ug via INTRAVENOUS
  Administered 2019-07-19: 45 ug via INTRAVENOUS
  Administered 2019-07-19: 15 ug via INTRAVENOUS
  Filled 2019-07-15: qty 20

## 2019-07-15 MED ORDER — ONDANSETRON HCL 4 MG/2ML IJ SOLN
INTRAMUSCULAR | Status: DC | PRN
  Administered 2019-07-15: 4 mg via INTRAVENOUS

## 2019-07-15 MED ORDER — SODIUM CHLORIDE (PF) 0.9 % IJ SOLN
INTRAMUSCULAR | Status: AC
  Filled 2019-07-15: qty 10

## 2019-07-15 MED ORDER — MIDAZOLAM HCL 2 MG/2ML IJ SOLN
INTRAMUSCULAR | Status: DC | PRN
  Administered 2019-07-15 (×2): 1 mg via INTRAVENOUS

## 2019-07-15 MED ORDER — SUCCINYLCHOLINE CHLORIDE 20 MG/ML IJ SOLN
INTRAMUSCULAR | Status: DC | PRN
Start: 2019-07-15 — End: 2019-07-15
  Administered 2019-07-15: 80 mg via INTRAVENOUS

## 2019-07-15 MED ORDER — PROMETHAZINE HCL 25 MG/ML IJ SOLN: 6.2500 mg | INTRAMUSCULAR | Status: DC | PRN

## 2019-07-15 MED ORDER — TRAMADOL HCL 50 MG PO TABS
50.0000 mg | ORAL_TABLET | Freq: Four times a day (QID) | ORAL | Status: DC | PRN
  Administered 2019-07-16 – 2019-07-21 (×8): 100 mg via ORAL
  Administered 2019-07-22: 50 mg via ORAL
  Filled 2019-07-15 (×4): qty 2
  Filled 2019-07-15: qty 1
  Filled 2019-07-15 (×4): qty 2

## 2019-07-15 MED ORDER — OXYCODONE HCL 5 MG PO TABS
5.0000 mg | ORAL_TABLET | ORAL | Status: DC | PRN
  Administered 2019-07-15 – 2019-07-17 (×6): 10 mg via ORAL
  Administered 2019-07-17: 5 mg via ORAL
  Administered 2019-07-17 – 2019-07-22 (×13): 10 mg via ORAL
  Filled 2019-07-15 (×19): qty 2

## 2019-07-15 MED ORDER — SODIUM CHLORIDE 0.9 % IV SOLN
2.0000 g | Freq: Two times a day (BID) | INTRAVENOUS | Status: DC
  Administered 2019-07-15 – 2019-07-17 (×5): 2 g via INTRAVENOUS
  Filled 2019-07-15 (×6): qty 2

## 2019-07-15 MED ORDER — CEFAZOLIN SODIUM-DEXTROSE 1-4 GM/50ML-% IV SOLN
INTRAVENOUS | Status: DC | PRN
  Administered 2019-07-15: 1 g via INTRAVENOUS

## 2019-07-15 MED ORDER — LACTATED RINGERS IV SOLN: INTRAVENOUS | Status: DC

## 2019-07-15 MED ORDER — DIPHENHYDRAMINE HCL 12.5 MG/5ML PO ELIX
12.5000 mg | ORAL_SOLUTION | Freq: Four times a day (QID) | ORAL | Status: DC | PRN
  Filled 2019-07-15: qty 5

## 2019-07-15 MED ORDER — HYDROMORPHONE HCL 1 MG/ML IJ SOLN
INTRAMUSCULAR | Status: AC
  Filled 2019-07-15: qty 1

## 2019-07-15 MED ORDER — PHENYLEPHRINE HCL-NACL 10-0.9 MG/250ML-% IV SOLN
INTRAVENOUS | Status: DC | PRN
  Administered 2019-07-15: 40 ug/min via INTRAVENOUS

## 2019-07-15 MED ORDER — PROPOFOL 10 MG/ML IV BOLUS
INTRAVENOUS | Status: AC
  Filled 2019-07-15: qty 20

## 2019-07-15 MED ORDER — VANCOMYCIN HCL 500 MG/100ML IV SOLN
500.0000 mg | Freq: Two times a day (BID) | INTRAVENOUS | Status: DC
  Administered 2019-07-15 – 2019-07-18 (×7): 500 mg via INTRAVENOUS
  Filled 2019-07-15 (×8): qty 100

## 2019-07-15 MED ORDER — VANCOMYCIN HCL IN DEXTROSE 1-5 GM/200ML-% IV SOLN
1000.0000 mg | Freq: Once | INTRAVENOUS | Status: AC
  Administered 2019-07-15: 1000 mg via INTRAVENOUS
  Filled 2019-07-15: qty 200

## 2019-07-15 MED ORDER — SUGAMMADEX SODIUM 200 MG/2ML IV SOLN
INTRAVENOUS | Status: DC | PRN
  Administered 2019-07-15: 200 mg via INTRAVENOUS

## 2019-07-15 MED ORDER — LACTATED RINGERS IV SOLN: INTRAVENOUS | Status: DC | PRN

## 2019-07-15 MED ORDER — HEPARIN SODIUM (PORCINE) 5000 UNIT/ML IJ SOLN: 5000.0000 [IU] | Freq: Three times a day (TID) | INTRAMUSCULAR | Status: DC

## 2019-07-15 MED ORDER — GUAIFENESIN-DM 100-10 MG/5ML PO SYRP
5.0000 mL | ORAL_SOLUTION | Freq: Three times a day (TID) | ORAL | Status: DC | PRN
  Administered 2019-07-15 – 2019-07-19 (×5): 5 mL via ORAL
  Filled 2019-07-15 (×5): qty 5

## 2019-07-15 MED ORDER — LACTATED RINGERS IV SOLN
INTRAVENOUS | Status: DC | PRN
Start: 2019-07-15 — End: 2019-07-15

## 2019-07-15 MED ORDER — ONDANSETRON HCL 4 MG/2ML IJ SOLN
INTRAMUSCULAR | Status: AC
  Filled 2019-07-15: qty 2

## 2019-07-15 SURGICAL SUPPLY — 87 items
ADH SKN CLS APL DERMABOND .7 (GAUZE/BANDAGES/DRESSINGS) ×1
BAG SPEC RTRVL LRG 6X4 10 (ENDOMECHANICALS)
BLADE CLIPPER SURG (BLADE) ×1 IMPLANT
BLADE SURG 10 STRL SS (BLADE) ×1 IMPLANT
CANISTER SUCT 3000ML PPV (MISCELLANEOUS) ×3 IMPLANT
CLIP VESOCCLUDE MED 6/CT (CLIP) ×2 IMPLANT
CNTNR URN SCR LID CUP LEK RST (MISCELLANEOUS) ×2 IMPLANT
CONN ST 1/4X3/8  BEN (MISCELLANEOUS)
CONN ST 1/4X3/8 BEN (MISCELLANEOUS) IMPLANT
CONN Y 3/8X3/8X3/8  BEN (MISCELLANEOUS) ×2
CONN Y 3/8X3/8X3/8 BEN (MISCELLANEOUS) IMPLANT
CONT SPEC 4OZ STRL OR WHT (MISCELLANEOUS) ×6
COVER SURGICAL LIGHT HANDLE (MISCELLANEOUS) ×2 IMPLANT
DERMABOND ADVANCED (GAUZE/BANDAGES/DRESSINGS) ×1
DERMABOND ADVANCED .7 DNX12 (GAUZE/BANDAGES/DRESSINGS) ×1 IMPLANT
DRAIN CHANNEL 28F RND 3/8 FF (WOUND CARE) ×2 IMPLANT
DRAPE CV SPLIT W-CLR ANES SCRN (DRAPES) ×2 IMPLANT
DRAPE ORTHO SPLIT 77X108 STRL (DRAPES) ×2
DRAPE SURG ORHT 6 SPLT 77X108 (DRAPES) ×1 IMPLANT
DRAPE WARM FLUID 44X44 (DRAPES) ×1 IMPLANT
ELECT BLADE 6.5 EXT (BLADE) ×2 IMPLANT
ELECT REM PT RETURN 9FT ADLT (ELECTROSURGICAL) ×2
ELECTRODE REM PT RTRN 9FT ADLT (ELECTROSURGICAL) ×1 IMPLANT
GAUZE SPONGE 4X4 12PLY STRL (GAUZE/BANDAGES/DRESSINGS) IMPLANT
GAUZE SPONGE 4X4 12PLY STRL LF (GAUZE/BANDAGES/DRESSINGS) ×1 IMPLANT
GLOVE BIOGEL M 6.5 STRL (GLOVE) ×1 IMPLANT
GLOVE BIOGEL PI IND STRL 6.5 (GLOVE) IMPLANT
GLOVE BIOGEL PI INDICATOR 6.5 (GLOVE) ×7
GLOVE NEODERM STRL 7.5  LF PF (GLOVE) ×4
GLOVE NEODERM STRL 7.5 LF PF (GLOVE) ×2 IMPLANT
GLOVE SURG NEODERM 7.5  LF PF (GLOVE) ×4
GOWN STRL REUS W/ TWL LRG LVL3 (GOWN DISPOSABLE) ×2 IMPLANT
GOWN STRL REUS W/TWL LRG LVL3 (GOWN DISPOSABLE) ×4
HEMOSTAT SURGICEL 2X14 (HEMOSTASIS) IMPLANT
KIT BASIN OR (CUSTOM PROCEDURE TRAY) ×3 IMPLANT
KIT SUCTION CATH 14FR (SUCTIONS) ×3 IMPLANT
KIT TURNOVER KIT B (KITS) ×3 IMPLANT
NS IRRIG 1000ML POUR BTL (IV SOLUTION) ×8 IMPLANT
PACK CHEST (CUSTOM PROCEDURE TRAY) ×3 IMPLANT
PAD ARMBOARD 7.5X6 YLW CONV (MISCELLANEOUS) ×5 IMPLANT
POUCH ENDO CATCH II 15MM (MISCELLANEOUS) IMPLANT
POUCH SPECIMEN RETRIEVAL 10MM (ENDOMECHANICALS) IMPLANT
SCISSORS LAP 5X35 DISP (ENDOMECHANICALS) IMPLANT
SEALANT PROGEL (MISCELLANEOUS) IMPLANT
SEALANT SURG COSEAL 4ML (VASCULAR PRODUCTS) IMPLANT
SEALANT SURG COSEAL 8ML (VASCULAR PRODUCTS) IMPLANT
SHEARS HARMONIC HDI 20CM (ELECTROSURGICAL) IMPLANT
SOL ANTI FOG 6CC (MISCELLANEOUS) ×1 IMPLANT
SOLUTION ANTI FOG 6CC (MISCELLANEOUS) ×1
SPECIMEN JAR MEDIUM (MISCELLANEOUS) ×2 IMPLANT
SPONGE INTESTINAL PEANUT (DISPOSABLE) ×2 IMPLANT
SPONGE TONSIL TAPE 1 RFD (DISPOSABLE) ×3 IMPLANT
SUT MNCRL AB 4-0 PS2 18 (SUTURE) ×2 IMPLANT
SUT PDS AB 1 CTX 36 (SUTURE) ×1 IMPLANT
SUT PROLENE 4 0 RB 1 (SUTURE)
SUT PROLENE 4-0 RB1 .5 CRCL 36 (SUTURE) IMPLANT
SUT SILK  1 MH (SUTURE) ×2
SUT SILK 1 MH (SUTURE) ×2 IMPLANT
SUT SILK 1 TIES 10X30 (SUTURE) ×2 IMPLANT
SUT SILK 2 0 SH (SUTURE) IMPLANT
SUT SILK 2 0SH CR/8 30 (SUTURE) IMPLANT
SUT SILK 3 0 SH 30 (SUTURE) IMPLANT
SUT SILK 3 0SH CR/8 30 (SUTURE) ×1 IMPLANT
SUT VIC AB 0 CT1 27 (SUTURE) ×2
SUT VIC AB 0 CT1 27XBRD ANBCTR (SUTURE) ×1 IMPLANT
SUT VIC AB 0 CTX 27 (SUTURE) IMPLANT
SUT VIC AB 1 CTX 27 (SUTURE) ×2 IMPLANT
SUT VIC AB 2-0 CT1 27 (SUTURE)
SUT VIC AB 2-0 CT1 TAPERPNT 27 (SUTURE) IMPLANT
SUT VIC AB 2-0 CTX 36 (SUTURE) ×4 IMPLANT
SUT VIC AB 3-0 MH 27 (SUTURE) IMPLANT
SUT VIC AB 3-0 SH 27 (SUTURE)
SUT VIC AB 3-0 SH 27X BRD (SUTURE) IMPLANT
SUT VIC AB 3-0 X1 27 (SUTURE) ×2 IMPLANT
SUT VICRYL 0 UR6 27IN ABS (SUTURE) IMPLANT
SUT VICRYL 2 TP 1 (SUTURE) IMPLANT
SYR 30ML LL (SYRINGE) ×2 IMPLANT
SYSTEM SAHARA CHEST DRAIN ATS (WOUND CARE) ×3 IMPLANT
TAPE CLOTH SURG 4X10 WHT LF (GAUZE/BANDAGES/DRESSINGS) ×1 IMPLANT
TIP APPLICATOR SPRAY EXTEND 16 (VASCULAR PRODUCTS) IMPLANT
TOWEL GREEN STERILE (TOWEL DISPOSABLE) ×2 IMPLANT
TOWEL GREEN STERILE FF (TOWEL DISPOSABLE) ×2 IMPLANT
TRAP SPECIMEN MUCUS 40CC (MISCELLANEOUS) ×1 IMPLANT
TRAY FOLEY MTR SLVR 16FR STAT (SET/KITS/TRAYS/PACK) ×2 IMPLANT
TROCAR XCEL BLADELESS 5X75MML (TROCAR) ×2 IMPLANT
TROCAR XCEL NON-BLD 5MMX100MML (ENDOMECHANICALS) IMPLANT
WATER STERILE IRR 1000ML POUR (IV SOLUTION) ×3 IMPLANT

## 2019-07-15 NOTE — Progress Notes (Signed)
Pt received from PACU to 4E15 with PCA pump and left chest tube in place. Oriented to room and call bell. CHG wipes applied. Tele box connected and CCMD called. VSS. Call bell in reach. Will continue to monitor.  Hazle Nordmann, RN

## 2019-07-15 NOTE — Progress Notes (Signed)
Pharmacy Antibiotic Note  Sally Buck is a 59 y.o. female admitted on 07/13/2019 with PNA and fevers.  Pharmacy has been consulted for Vancomycin and Cefepime dosing.  Plan: Vancomycin 1000 mg IV now, then 500 mg IV BID Cefepime 2 g IV q12h  Height: 4\' 9"  (144.8 cm) Weight: 43.5 kg (95 lb 12.8 oz) IBW/kg (Calculated) : 38.6  Temp (24hrs), Avg:100.4 F (38 C), Min:98.6 F (37 C), Max:103.2 F (39.6 C)  Recent Labs  Lab 07/13/19 1608 07/13/19 1730 07/14/19 0517 07/15/19 0426  WBC 8.6  --  16.8* 32.4*  CREATININE 0.68  --  0.65 0.72  LATICACIDVEN  --  1.2  --   --     Estimated Creatinine Clearance: 45 mL/min (by C-G formula based on SCr of 0.72 mg/dL).    Allergies  Allergen Reactions  . Ampicillin Itching  . Ceclor [Cefaclor] Other (See Comments)    Passed out   . Other Other (See Comments)    travoprost : unknown  . Sulfa Antibiotics   . Tape Itching  . Tetracyclines & Related Itching  . Tylenol With Codeine #3 [Acetaminophen-Codeine] Other (See Comments)    Passed out    07/17/19 07/15/2019 7:15 AM

## 2019-07-15 NOTE — Progress Notes (Addendum)
PROGRESS NOTE                                                                                                                                                                                                             Patient Demographics:    Sally Buck, is a 59 y.o. female, DOB - 1960-08-15, IEP:[id number]  Admit date - 07/13/2019   Admitting Physician Therisa Doyne, MD  Outpatient Primary MD for the patient is Marva Panda, NP  LOS - 2   Chief Complaint  Patient presents with  . Allergic Reaction       Brief Narrative    59 yo female with past medical history of recurrent pneumonia, GERD, and Hyperlipidemia who presented with a chief complaint of shortness of breath with associated chest and shoulder pain, progressive shortness of breath, fever and chills, patient reports she has had multiple episodes of pneumonia in the past ,On admission patient was seen febrile, tachypneic, tachycardic,Chest CT was then obtained that revealed significant for loculated pleural effusion vs empyema. IR was consulted and preformed US guided left thoracentesis that yielded of cloudy thick purulent-appearing fluid. Post thoracentesis patient continues to remain febrile with worsening leukocytosis, increased work of breathing, PCCM were consulted, recommendation for CT surgery evaluation and surgery.    Subjective:    Sally Buck today reports she is feeling worse, more dyspneic, more tired and fatigued, .   Assessment  & Plan :    Active Problems:   CAP (community acquired pneumonia)   Empyema (HCC)   Tachycardia   Sepsis (HCC)   Dehydration   Hyponatremia  Acute hypoxic resp failute and Sepsis secondary to pneumonia, with loculated empyema. -Sepsis present on admission, leukocytosis, tachypneic, tachycardic and hypoxia with elevated procalcitonin. -Blood cultures no growth to date, per effusion culture growing Staph aureus. -Patient  with worsening sepsis today, leukocytosis elevated at 30 2K, more tachypneic and hypoxic as well, has broadened her antibiotic coverage from Rocephin to vancomycin, cefepime and Flagyl. -PCCM input appreciated, she is not a candidate for conservative management, CTS were consulted, plan for surgery today. -This post thoracentesis with 250 cc cloudy thick purulent drained   COVID-19 Labs  Recent Labs    07/13/19 1730  DDIMER 3.59*    Lab Results  Component Value Date   SARSCOV2NAA NEGATIVE 07/13/2019   Elevated D-dimers -  Is most likely in the setting of severe chest infection she is having, will start on subcu heparin for DVT prophylaxis, and check venous Dopplers. -CTA chest negative for PE.  Hyponatremia -Mild, symptomatic, continue to monitor  Hypomagnesemia -Repleted  History of asthma -No active wheezing   Code Status : Full  Family Communication  : None at bedside, but was not present on CT surgery  discussed with daughter via phone  Disposition Plan  :  Status is: Inpatient  Remains inpatient appropriate because:Hemodynamically unstable   Dispo: The patient is from: Home              Anticipated d/c is to: Home              Anticipated d/c date is: > 3 days              Patient currently is not medically stable to d/c.      Consults  :  PCCM , CT surgery  Procedures  :  -Thoracentesis by IR.  DVT Prophylaxis  : Will start subcu heparin after surgery.  Lab Results  Component Value Date   PLT 415 (H) 07/15/2019    Antibiotics  :    Anti-infectives (From admission, onward)   Start     Dose/Rate Route Frequency Ordered Stop   07/15/19 2200  vancomycin (VANCOREADY) IVPB 500 mg/100 mL     500 mg 100 mL/hr over 60 Minutes Intravenous Every 12 hours 07/15/19 0723     07/15/19 0800  metroNIDAZOLE (FLAGYL) IVPB 500 mg     500 mg 100 mL/hr over 60 Minutes Intravenous Every 8 hours 07/15/19 0711     07/15/19 0800  vancomycin (VANCOCIN) IVPB 1000  mg/200 mL premix     1,000 mg 200 mL/hr over 60 Minutes Intravenous  Once 07/15/19 0723     07/15/19 0800  ceFEPIme (MAXIPIME) 2 g in sodium chloride 0.9 % 100 mL IVPB     2 g 200 mL/hr over 30 Minutes Intravenous Every 12 hours 07/15/19 0723     07/14/19 2200  cefTRIAXone (ROCEPHIN) 2 g in sodium chloride 0.9 % 100 mL IVPB  Status:  Discontinued     2 g 200 mL/hr over 30 Minutes Intravenous Every 24 hours 07/13/19 2352 07/15/19 0711   07/14/19 2000  azithromycin (ZITHROMAX) 500 mg in sodium chloride 0.9 % 250 mL IVPB  Status:  Discontinued     500 mg 250 mL/hr over 60 Minutes Intravenous Every 24 hours 07/13/19 2352 07/14/19 1559   07/14/19 0000  cefTRIAXone (ROCEPHIN) 2 g in sodium chloride 0.9 % 100 mL IVPB     2 g 200 mL/hr over 30 Minutes Intravenous  Once 07/13/19 2357 07/14/19 0235   07/13/19 1715  levofloxacin (LEVAQUIN) IVPB 750 mg     750 mg 100 mL/hr over 90 Minutes Intravenous  Once 07/13/19 1706 07/13/19 1937        Objective:   Vitals:   07/15/19 0230 07/15/19 0400 07/15/19 0725 07/15/19 0728  BP: 96/81 126/84 (!) 144/128 (!) 145/80  Pulse: (!) 131 (!) 131 (!) 126 (!) 128  Resp: (!) 27 (!) 35 (!) 26 (!) 33  Temp: (!) 102.6 F (39.2 C) 98.7 F (37.1 C) 98.1 F (36.7 C)   TempSrc: Axillary Axillary    SpO2: 94% 92% 94% 93%  Weight:      Height:        Wt Readings from Last 3 Encounters:  07/13/19 43.5 kg  07/11/15 42  kg  10/21/14 42.2 kg     Intake/Output Summary (Last 24 hours) at 07/15/2019 2956 Last data filed at 07/15/2019 0400 Gross per 24 hour  Intake 240 ml  Output 700 ml  Net -460 ml     Physical Exam  Awake Alert, Oriented X 3, No new F.N deficits, Normal affect, with mild respiratory distress with increased work of breathing.  Symmetrical Chest wall movement, coarse respiratory sounds bilaterally, but significantly diminished in the left lung, tachypneic. Tachycardic,No Gallops,Rubs or new Murmurs, No Parasternal Heave +ve B.Sounds, Abd  Soft, No tenderness, No rebound - guarding or rigidity. No Cyanosis, Clubbing or edema, No new Rash or bruise      Data Review:    CBC Recent Labs  Lab 07/13/19 1608 07/14/19 0517 07/15/19 0426  WBC 8.6 16.8* 32.4*  HGB 12.8 12.0 12.6  HCT 38.8 36.0 37.3  PLT 416* 390 415*  MCV 89.0 89.6 90.5  MCH 29.4 29.9 30.6  MCHC 33.0 33.3 33.8  RDW 11.4* 11.6 11.9  LYMPHSABS  --  0.3*  --   MONOABS  --  0.9  --   EOSABS  --  0.0  --   BASOSABS  --  0.1  --     Chemistries  Recent Labs  Lab 07/13/19 1608 07/14/19 0517 07/15/19 0426  NA 131* 131* 132*  K 4.3 4.0 3.7  CL 92* 98 99  CO2 24 23 21*  GLUCOSE 177* 126* 123*  BUN 12 7* 9  CREATININE 0.68 0.65 0.72  CALCIUM 9.3 8.5* 8.4*  MG  --  1.5* 2.1  AST  --  34 23  ALT  --  54* 36  ALKPHOS  --  140* 144*  BILITOT  --  1.6* 1.3*   ------------------------------------------------------------------------------------------------------------------ No results for input(s): CHOL, HDL, LDLCALC, TRIG, CHOLHDL, LDLDIRECT in the last 72 hours.  No results found for: HGBA1C ------------------------------------------------------------------------------------------------------------------ Recent Labs    07/14/19 0517  TSH 1.156   ------------------------------------------------------------------------------------------------------------------ No results for input(s): VITAMINB12, FOLATE, FERRITIN, TIBC, IRON, RETICCTPCT in the last 72 hours.  Coagulation profile Recent Labs  Lab 07/15/19 0426  INR 1.3*    Recent Labs    07/13/19 1730  DDIMER 3.59*    Cardiac Enzymes No results for input(s): CKMB, TROPONINI, MYOGLOBIN in the last 168 hours.  Invalid input(s): CK ------------------------------------------------------------------------------------------------------------------ No results found for: BNP  Inpatient Medications  Scheduled Meds: . docusate sodium  100 mg Oral BID  . latanoprost  1 drop Both Eyes QHS    . loratadine  10 mg Oral Daily  . mometasone-formoterol  2 puff Inhalation BID  . sodium chloride flush  3 mL Intravenous Q12H   Continuous Infusions: . sodium chloride 150 mL/hr at 07/15/19 0145  . ceFEPime (MAXIPIME) IV 2 g (07/15/19 0752)  . metronidazole 500 mg (07/15/19 0858)  . sodium chloride    . vancomycin    . vancomycin     PRN Meds:.acetaminophen **OR** [DISCONTINUED] acetaminophen, guaiFENesin-dextromethorphan, levalbuterol, ondansetron **OR** ondansetron (ZOFRAN) IV, oxyCODONE, traMADol  Micro Results Recent Results (from the past 240 hour(s))  Culture, blood (Routine X 2) w Reflex to ID Panel     Status: None (Preliminary result)   Collection Time: 07/13/19  5:30 PM   Specimen: BLOOD RIGHT FOREARM  Result Value Ref Range Status   Specimen Description BLOOD RIGHT FOREARM  Final   Special Requests   Final    BOTTLES DRAWN AEROBIC AND ANAEROBIC Blood Culture adequate volume   Culture   Final  NO GROWTH 2 DAYS Performed at [facility], [address] [place], [place] [address]    Report Status PENDING  Incomplete  Culture, blood (Routine X 2) w Reflex to ID Panel     Status: None (Preliminary result)   Collection Time: 07/13/19  5:30 PM   Specimen: BLOOD  Result Value Ref Range Status   Specimen Description BLOOD LEFT ANTECUBITAL  Final   Special Requests   Final    BOTTLES DRAWN AEROBIC AND ANAEROBIC Blood Culture results may not be optimal due to an inadequate volume of blood received in culture bottles   Culture   Final    NO GROWTH 2 DAYS Performed at [facility], [address] [place], [place] [address]    Report Status PENDING  Incomplete  SARS Coronavirus 2 by RT PCR (hospital order, performed in [facility] hospital lab) Nasopharyngeal Nasopharyngeal Swab     Status: None   Collection Time: 07/13/19  5:41 PM   Specimen: Nasopharyngeal Swab  Result Value Ref Range Status   SARS Coronavirus 2 NEGATIVE NEGATIVE Final    Comment:  (NOTE) SARS-CoV-2 target nucleic acids are NOT DETECTED. The SARS-CoV-2 RNA is generally detectable in upper and lower respiratory specimens during the acute phase of infection. The lowest concentration of SARS-CoV-2 viral copies this assay can detect is 250 copies / mL. A negative result does not preclude SARS-CoV-2 infection and should not be used as the sole basis for treatment or other patient management decisions.  A negative result may occur with improper specimen collection / handling, submission of specimen other than nasopharyngeal swab, presence of viral mutation(s) within the areas targeted by this assay, and inadequate number of viral copies (<250 copies / mL). A negative result must be combined with clinical observations, patient history, and epidemiological information. Fact Sheet for Patients:   BoilerBrush.com.cy Fact Sheet for Healthcare Providers: https://pope.com/ This test is not yet approved or cleared  by the [place] FDA and has been authorized for detection and/or diagnosis of SARS-CoV-2 by FDA under an Emergency Use Authorization (EUA).  This EUA will remain in effect (meaning this test can be used) for the duration of the COVID-19 declaration under Section 564(b)(1) of the Act, 21 U.S.C. section 360bbb-3(b)(1), unless the authorization is terminated or revoked sooner. Performed at [facility], [address], [place], [place] [address]   MRSA PCR Screening     Status: None   Collection Time: 07/14/19  7:30 AM   Specimen: Nasal Mucosa; Nasopharyngeal  Result Value Ref Range Status   MRSA by PCR NEGATIVE NEGATIVE Final    Comment:        The GeneXpert MRSA Assay (FDA approved for NASAL specimens only), is one component of a comprehensive MRSA colonization surveillance program. It is not intended to diagnose MRSA infection nor to guide or monitor treatment for MRSA infections. Performed at  [facility], [address], [place], [place] [address]   Respiratory Panel by PCR     Status: None   Collection Time: 07/14/19  7:30 AM   Specimen: Nasal Mucosa; Respiratory  Result Value Ref Range Status   Adenovirus NOT DETECTED NOT DETECTED Final   Coronavirus 229E NOT DETECTED NOT DETECTED Final    Comment: (NOTE) The Coronavirus on the Respiratory Panel, DOES NOT test for the novel  Coronavirus (2019 nCoV)    Coronavirus HKU1 NOT DETECTED NOT DETECTED Final   Coronavirus NL63 NOT DETECTED NOT DETECTED Final  Coronavirus OC43 NOT DETECTED NOT DETECTED Final   Metapneumovirus NOT DETECTED NOT DETECTED Final   Rhinovirus / Enterovirus NOT DETECTED NOT DETECTED Final   Influenza A NOT DETECTED NOT DETECTED Final   Influenza B NOT DETECTED NOT DETECTED Final   Parainfluenza Virus 1 NOT DETECTED NOT DETECTED Final   Parainfluenza Virus 2 NOT DETECTED NOT DETECTED Final   Parainfluenza Virus 3 NOT DETECTED NOT DETECTED Final   Parainfluenza Virus 4 NOT DETECTED NOT DETECTED Final   Respiratory Syncytial Virus NOT DETECTED NOT DETECTED Final   Bordetella pertussis NOT DETECTED NOT DETECTED Final   Chlamydophila pneumoniae NOT DETECTED NOT DETECTED Final   Mycoplasma pneumoniae NOT DETECTED NOT DETECTED Final    Comment: Performed at [facility], [address], [place], [place] [address]  Gram stain     Status: None   Collection Time: 07/14/19  8:46 AM   Specimen: Lung, Left; Pleural Fluid  Result Value Ref Range Status   Specimen Description PLEURAL FLUID  Final   Special Requests LEFT LUNG  Final   Gram Stain   Final    WBC PRESENT,BOTH PMN AND MONONUCLEAR GRAM POSITIVE COCCI CYTOSPIN SMEAR Gram Stain Report Called to,Read Back By and Verified With: Elfredia Nevins RN [id number] 07/14/19 A BROWNING Performed at [facility], [address] [place], [place] [address]    Report Status 07/14/2019 FINAL  Final  Culture, body fluid-bottle     Status: Abnormal  (Preliminary result)   Collection Time: 07/14/19  8:46 AM   Specimen: Pleura  Result Value Ref Range Status   Specimen Description PLEURAL FLUID  Final   Special Requests LEFT LUNG  Final   Gram Stain   Final    IN BOTH AEROBIC AND ANAEROBIC BOTTLES GRAM POSITIVE COCCI IN CLUSTERS CRITICAL RESULT CALLED TO, READ BACK BY AND VERIFIED WITH: RN R SANTIAGO [id number] AT 908 AM BY CM    Culture (A)  Final    STAPHYLOCOCCUS AUREUS SUSCEPTIBILITIES TO FOLLOW CRITICAL RESULT CALLED TO, READ BACK BY AND VERIFIED WITH: RN R SANTIAGO [id number] AR 908 AM BY CM Performed at [facility], [address] [place], [place] [address]    Report Status PENDING  Incomplete    Radiology Reports DG Chest 1 View  Result Date: 07/14/2019 CLINICAL DATA:  Status post thoracentesis on the left EXAM: CHEST  1 VIEW COMPARISON:  07/13/2019 FINDINGS: Cardiac shadow is stable. Slight reduction in pleural fluid is noted when compare with the prior exam. No pneumothorax is seen. Slight increased density is noted throughout the left lung which may be related to some re-expansion edema. No bony abnormality is noted. IMPRESSION: No pneumothorax following thoracentesis. There are however changes suggestive of re-expansion edema. Electronically Signed   By: Alcide Clever M.D.   On: 07/14/2019 08:54   DG Chest 2 View  Result Date: 07/13/2019 CLINICAL DATA:  Chest pain, shortness of breath. EXAM: CHEST - 2 VIEW COMPARISON:  October 21, 2014. FINDINGS: The heart size and mediastinal contours are within normal limits. No pneumothorax is noted. Right lung is clear. Mild left pleural effusion is noted with associated left basilar infiltrate or atelectasis. The visualized skeletal structures are unremarkable. IMPRESSION: Mild left pleural effusion is noted with associated left basilar infiltrate or atelectasis. Electronically Signed   By: Lupita Raider M.D.   On: 07/13/2019 16:47   CT CHEST WO CONTRAST  Result Date:  07/14/2019 CLINICAL DATA:  And pain in a status post  thoracentesis EXAM: CT CHEST WITHOUT CONTRAST TECHNIQUE: Multidetector CT imaging of the chest was performed following the standard protocol without IV contrast. COMPARISON:  07/13/2019 FINDINGS: Cardiovascular: Heart is unremarkable without pericardial effusion. Grossly normal caliber of the thoracic aorta. Evaluation limited without intravenous contrast. Mediastinum/Nodes: Evaluation is limited without IV contrast. The thyroid, trachea, and esophagus are grossly unremarkable. No discrete adenopathy. Lungs/Pleura: Since the prior CT, there is been significant increase in the size of the multilocular complex left pleural effusion. There is progressive consolidation throughout the left lung, with relative sparing of the left apex. No left-sided pneumothorax after thoracentesis. There is a small free-flowing right pleural effusion. Patchy consolidation right lower lobe likely atelectasis. The central airways are patent. Upper Abdomen: No acute abnormality. Musculoskeletal: No acute or destructive bony lesions. Reconstructed images demonstrate no additional findings. IMPRESSION: 1. Enlarging multilocular complex left pleural effusion, compatible with empyema. 2. Progressive left lung consolidation with relative sparing of the left apex. This could reflect underlying pneumonia. 3. Trace free-flowing right pleural effusion, new since prior study. Electronically Signed   By: Randa Ngo M.D.   On: 07/14/2019 20:08   CT Angio Chest PE W and/or Wo Contrast  Result Date: 07/13/2019 CLINICAL DATA:  Shortness of breath with left-sided chest pain. EXAM: CT ANGIOGRAPHY CHEST WITH CONTRAST TECHNIQUE: Multidetector CT imaging of the chest was performed using the standard protocol during bolus administration of intravenous contrast. Multiplanar CT image reconstructions and MIPs were obtained to evaluate the vascular anatomy. CONTRAST:  67mL OMNIPAQUE IOHEXOL 350 MG/ML SOLN  COMPARISON:  None. FINDINGS: Cardiovascular: Heart size normal. Small pericardial effusion evident. No thoracic aortic aneurysm. No filling defects within the opacified pulmonary arteries to suggest the presence of an acute pulmonary embolus Mediastinum/Nodes: 11 mm short axis AP window lymph node visible on image 44/5 nodular soft tissue is identified in the anterior mediastinum, potentially related to small clustered lymph nodes. No right hilar lymphadenopathy. There is abnormal soft tissue attenuation in the left hilum. There is no axillary lymphadenopathy. Lungs/Pleura: Ground-glass opacity is seen fairly diffusely in the left upper lobe with diffuse consolidation of the lingula with airspace consolidation tracking centrally into the left hilum. There is left lower lobe collapse inferiorly. These changes are associated with a moderate left pleural effusion demonstrating loculation in the apex and anterior upper hemithorax. Nodular pleural thickening or loculated fluid is seen along the medial pleura adjacent to the transverse aorta. No right pleural effusion. Upper Abdomen: Unremarkable. Musculoskeletal: No worrisome lytic or sclerotic osseous abnormality. Review of the MIP images confirms the above findings. IMPRESSION: 1. No CT evidence for acute pulmonary embolus. 2. Diffuse ground-glass opacity in the left upper lobe with diffuse consolidation of the lingula tracking centrally into the left hilum. This is associated with a moderate left pleural effusion demonstrating loculation and prominent areas of left lower lobe atelectasis. Nodular pleural thickening or loculated fluid is identified along the medial pleura adjacent to the transverse aorta. Mildly enlarged AP window lymph node with question of soft tissue nodularity/clustered lymph nodes in the anterior mediastinum. These changes may all be related to pneumonia, given the apparent pleural nodularity towards the apex, neoplasm is a distinct concern. Close  follow-up recommended. Electronically Signed   By: Misty Stanley M.D.   On: 07/13/2019 20:44   DG Chest Port 1 View  Result Date: 07/15/2019 CLINICAL DATA:  Empyema. EXAM: PORTABLE CHEST 1 VIEW COMPARISON:  CT 07/14/2019.  Chest x-ray 07/14/2019. FINDINGS: Complete opacification left hemithorax is noted on today's exam.  This is most consistent with reaccumulation of left pleural fluid/progressive empyema. Underlying pulmonary infiltrate cannot be excluded. No pneumothorax. Heart size cannot be evaluated no acute bony abnormality. IMPRESSION: Complete opacification left hemithorax is noted on today's exam. This is most consistent with reaccumulation of left pleural fluid/progressive empyema. Electronically Signed   By: Maisie Fus  Register   On: 07/15/2019 08:40   IR THORACENTESIS ASP PLEURAL SPACE W/IMG GUIDE  Result Date: 07/14/2019 INDICATION: Patient with history of recurrent pneumonia, presents with shortness of breath and left pleural effusion. Request is made for diagnostic and therapeutic left thoracentesis. EXAM: ULTRASOUND GUIDED DIAGNOSTIC AND THERAPEUTIC LEFT THORACENTESIS MEDICATIONS: 10 mL 1% lidocaine COMPLICATIONS: SIR Level A - No therapy, no consequence. PROCEDURE: An ultrasound guided thoracentesis was thoroughly discussed with the patient and questions answered. The benefits, risks, alternatives and complications were also discussed. The patient understands and wishes to proceed with the procedure. Written consent was obtained. Ultrasound was performed to localize and mark an adequate pocket of fluid in the left chest. The area was then prepped and draped in the normal sterile fashion. 1% Lidocaine was used for local anesthesia. Under ultrasound guidance a 6 Fr Safe-T-Centesis catheter was introduced. Thoracentesis was performed. The catheter was removed and a dressing applied. FINDINGS: A total of approximately 250 mL of cloudy, purulent-appearing fluid was removed. Samples were sent to the  laboratory as requested by the clinical team. Postprocedure chest x-ray shows show suggestion of re-expansion edema. IMPRESSION: Successful ultrasound guided diagnostic and therapeutic thoracentesis yielding 250 mL of pleural fluid. Read by: Loyce Dys PA-C Electronically Signed   By: Gilmer Mor D.O.   On: 07/14/2019 11:36     Huey Bienenstock M.D on 07/15/2019 at 9:23 AM  Between 7am to 7pm - Pager - [phone]  After 7pm go to [email or web address] - password [facility]  [facility] -  Office  [phone]

## 2019-07-15 NOTE — Progress Notes (Signed)
   07/15/19 1628  Assess: MEWS Score  Temp 98.1 F (36.7 C)  BP 109/62  Pulse Rate (!) 128  ECG Heart Rate (!) 129  Resp (!) 25  SpO2 90 %  O2 Device Nasal Cannula  O2 Flow Rate (L/min) 4 L/min  Assess: MEWS Score  MEWS Temp 0  MEWS Systolic 0  MEWS Pulse 2  MEWS RR 2  MEWS LOC 0  MEWS Score 4  MEWS Score Color Red  Assess: if the MEWS score is Yellow or Red  Were vital signs taken at a resting state? Yes  Focused Assessment Documented focused assessment  Early Detection of Sepsis Score *See Row Information* High  MEWS guidelines implemented *See Row Information* Yes  Treat  MEWS Interventions Other (Comment)  Take Vital Signs  Increase Vital Sign Frequency  Red: Q 1hr X 4 then Q 4hr X 4, if remains red, continue Q 4hrs  Escalate  MEWS: Escalate Red: discuss with charge nurse/RN and provider, consider discussing with RRT  Notify: Charge Nurse/RN  Name of Charge Nurse/RN Notified Kathlene November RN  Date Charge Nurse/RN Notified 07/15/19  Time Charge Nurse/RN Notified 1700  Notify: Provider  Provider Name/Title  (Fresh from PACU, MD aware of pt condition)  Document  Progress note created (see row info) Yes

## 2019-07-15 NOTE — Progress Notes (Signed)
   07/15/19 0400  Assess: MEWS Score  Temp 98.7 F (37.1 C)  BP 126/84  Pulse Rate (!) 131  ECG Heart Rate (!) 131  Resp (!) 35  SpO2 92 %  O2 Device Nasal Cannula  O2 Flow Rate (L/min) 5 L/min  Assess: MEWS Score  MEWS Temp 0  MEWS Systolic 0  MEWS Pulse 3  MEWS RR 2  MEWS LOC 0  MEWS Score 5  MEWS Score Color Red  Assess: if the MEWS score is Yellow or Red  Were vital signs taken at a resting state? Yes  Focused Assessment Documented focused assessment  Early Detection of Sepsis Score *See Row Information* High  MEWS guidelines implemented *See Row Information* No, previously red, continue vital signs every 4 hours   Patient was given Xopenex neb PRN overnight for assistance with optimizing respirations. Xopenex effective. Given Tylenol PRN for fever. Tylenol effective. RRT, charge nurse, and MD notified of VS and MEWS overnight as the evening transpired. Patient currently A/Ox4, 5L O2, and eating ice chips. She has no complaints at this time.

## 2019-07-15 NOTE — Anesthesia Procedure Notes (Signed)
Procedure Name: Intubation Date/Time: 07/15/2019 1:17 PM Performed by: Malachi Paradise, RN Pre-anesthesia Checklist: Patient identified, Emergency Drugs available, Suction available and Patient being monitored Patient Re-evaluated:Patient Re-evaluated prior to induction Oxygen Delivery Method: Circle System Utilized Preoxygenation: Pre-oxygenation with 100% oxygen Induction Type: IV induction Ventilation: Mask ventilation without difficulty Laryngoscope Size: Mac and 3 Grade View: Grade I Tube type: Oral Endobronchial tube: Double lumen EBT and Left and 35 Fr Number of attempts: 1 Airway Equipment and Method: Stylet and Oral airway Placement Confirmation: ETT inserted through vocal cords under direct vision,  positive ETCO2,  breath sounds checked- equal and bilateral and CO2 detector Tube secured with: Tape Dental Injury: Teeth and Oropharynx as per pre-operative assessment  Comments: Inserted by Larna Daughters

## 2019-07-15 NOTE — Brief Op Note (Signed)
07/13/2019 - 07/15/2019  2:23 PM  PATIENT:  Sally Buck  59 y.o. female  PRE-OPERATIVE DIAGNOSIS:  left empyema  POST-OPERATIVE DIAGNOSIS:  left emphyema  PROCEDURE:  Procedure(s):  VIDEO ASSISTED THORACOSCOPY (VATS)/DECORTICATION and Drainage of Empyema (Left)  SURGEON:  Surgeon(s) and Role:    * Linden Dolin, MD - Primary  PHYSICIAN ASSISTANT: Lowella Dandy PA-C  ANESTHESIA:   general  EBL: minimal  BLOOD ADMINISTERED:none  DRAINS: Straight and ankled chest tube    LOCAL MEDICATIONS USED:  BUPIVICAINE   SPECIMEN:  Source of Specimen:  pleural fluid, pleural peel  DISPOSITION OF SPECIMEN:  microbiology  COUNTS:  YES  TOURNIQUET:  * No tourniquets in log *  DICTATION: .Dragon Dictation  PLAN OF CARE: return to 5W to previous room  PATIENT DISPOSITION:  PACU - hemodynamically stable.   Delay start of Pharmacological VTE agent (>24hrs) due to surgical blood loss or risk of bleeding: no

## 2019-07-15 NOTE — Progress Notes (Addendum)
PT Cancellation Note  Patient Details Name: Sally Buck MRN: [id number] DOB: 1960-07-17   Cancelled Treatment:    Reason Eval/Treat Not Completed: Other (comment).  OT working with Pt.  PT to check back later today as time allows.   Thanks,  Corinna Capra, PT, DPT  Acute Rehabilitation [phone] pager [phone]) [phone] office       Lurena Joiner B Io Dieujuste 07/15/2019, 11:16 AM   07/15/2019 checked back at 11:48 AM and pt is in procedure (anesthesia note in chart, so likely surgical).  PT will check back tomorrow.   Thanks,  Corinna Capra, PT, DPT  Acute Rehabilitation [phone] pager [phone] office

## 2019-07-15 NOTE — Evaluation (Signed)
Occupational Therapy Evaluation Patient Details Name: Sally Buck MRN: [id number] DOB: 25-Jul-1960 Today's Date: 07/15/2019    History of Present Illness Patient is a 59 year old female admitted with cough that has progressively been getting worse. Pt has history of asthma. Pt dx with left empyema and scheduled for VATs 5/20.   Clinical Impression   Patient with functional deficits listed below impacting safety/independence with self care. Patient primary deficit is activity tolerance and need of O2. Patient on 5L semi-supine between 94-96%, HR in 120s. With activity pt on 6L maintaining 93-94% with increase in respiration rate to 38-42 and HR in low to mid 130s. Educate patient on pursed lip breathing techniques while standing sink side for grooming/hygiene. Also encourage patient to utilize bedside commode during the day vs only pure wick in order to increase activity tolerance and maintain strength, pt verbalize understanding. Patient going for VATs procedure this afternoon, will continue to follow.    Follow Up Recommendations  Home health OT(vs Home pending progress after VATs)    Equipment Recommendations  None recommended by OT       Precautions / Restrictions Precautions Precautions: Fall Restrictions Weight Bearing Restrictions: No      Mobility Bed Mobility Overal bed mobility: Modified Independent                Transfers Overall transfer level: Needs assistance Equipment used: None Transfers: Sit to/from Stand Sit to Stand: Min guard         General transfer comment: for safety    Balance Overall balance assessment: Mild deficits observed, not formally tested                                         ADL either performed or assessed with clinical judgement   ADL Overall ADL's : Needs assistance/impaired     Grooming: Oral care;Wash/dry face;Wash/dry hands;Min guard;Standing   Upper Body Bathing: Sitting;Set up   Lower  Body Bathing: Min guard;Sitting/lateral leans;Sit to/from stand   Upper Body Dressing : Set up;Sitting   Lower Body Dressing: Min guard;Sitting/lateral leans;Sit to/from stand   Toilet Transfer: Min Systems developer Details (indicate cue type and reason): simulated with functional mobility, min guard for safety, patient reports feeling tight after immobility at hospital Toileting- Clothing Manipulation and Hygiene: Min guard;Sitting/lateral lean;Sit to/from stand       Functional mobility during ADLs: Min guard General ADL Comments: Patient with decreased activity tolerance impacting safety/independence with self care.                  Pertinent Vitals/Pain Pain Assessment: Faces Faces Pain Scale: Hurts a little bit Pain Location: back, chest Pain Descriptors / Indicators: Sore Pain Intervention(s): Monitored during session     Hand Dominance Right   Extremity/Trunk Assessment Upper Extremity Assessment Upper Extremity Assessment: Generalized weakness   Lower Extremity Assessment Lower Extremity Assessment: Defer to PT evaluation   Cervical / Trunk Assessment Cervical / Trunk Assessment: Normal   Communication Communication Communication: No difficulties   Cognition Arousal/Alertness: Awake/alert Behavior During Therapy: WFL for tasks assessed/performed Overall Cognitive Status: Within Functional Limits for tasks assessed  Home Living Family/patient expects to be discharged to:: Private residence Living Arrangements: Alone Available Help at Discharge: Family;Available PRN/intermittently Type of Home: House Home Access: Stairs to enter [employer] of Steps: 1   Home Layout: One level     Bathroom Shower/Tub: Teacher, early years/pre: Standard     Home Equipment: None          Prior Functioning/Environment Level of Independence: Independent         Comments: 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        OT Problem List: Decreased activity tolerance;Decreased safety awareness;Impaired balance (sitting and/or standing);Decreased strength      OT Treatment/Interventions: Self-care/ADL training;Therapeutic exercise;Energy conservation;DME and/or AE instruction;Therapeutic activities;Balance training;Patient/family education    OT Goals(Current goals can be found in the care plan section) Acute Rehab OT Goals Patient Stated Goal: to feel better OT Goal Formulation: With patient Time For Goal Achievement: 07/29/19 Potential to Achieve Goals: Good  OT Frequency: Min 2X/week    AM-PAC OT "6 Clicks" Daily Activity     Outcome Measure Help from another person eating meals?: None Help from another person taking care of personal grooming?: A Little Help from another person toileting, which includes using toliet, bedpan, or urinal?: A Little Help from another person bathing (including washing, rinsing, drying)?: A Little Help from another person to put on and taking off regular upper body clothing?: A Little Help from another person to put on and taking off regular lower body clothing?: A Little 6 Click Score: 19   End of Session Equipment Utilized During Treatment: Oxygen  Activity Tolerance: Patient tolerated treatment well;Patient limited by fatigue Patient left: in bed;with call bell/phone within reach(vascular tech in the room)  OT Visit Diagnosis: Other abnormalities of gait and mobility (R26.89);Muscle weakness (generalized) (M62.81)                Time: 9604-5409 OT Time Calculation (min): 26 min Charges:  OT General Charges $OT Visit: 1 Visit OT Evaluation $OT Eval Moderate Complexity: 1 Mod OT Treatments $Self Care/Home Management : 8-22 mins  Delbert Phenix OT OT office: [place] 07/15/2019, 2:45 PM

## 2019-07-15 NOTE — Anesthesia Procedure Notes (Signed)
Arterial Line Insertion Start/End5/20/2021 12:50 PM, 07/15/2019 12:55 PM Performed by: Mayer Camel, CRNA, CRNA  Patient location: Pre-op. Preanesthetic checklist: patient identified, IV checked, site marked, risks and benefits discussed, surgical consent, monitors and equipment checked, pre-op evaluation, timeout performed and anesthesia consent Lidocaine 1% used for infiltration and patient sedated Right, radial was placed Catheter size: 20 G Hand hygiene performed  and maximum sterile barriers used   Attempts: 1 Procedure performed without using ultrasound guided technique. Following insertion, Biopatch and dressing applied. Post procedure assessment: normal  Patient tolerated the procedure well with no immediate complications. Additional procedure comments: Inserted by Bufford Lope .

## 2019-07-15 NOTE — Op Note (Signed)
Procedure(s): VIDEO ASSISTED THORACOSCOPY (VATS)/DECORTICATION and Drainage of Empyema Procedure Note  [facility] Teegarden female 59 y.o. 07/15/2019  Procedure(s) and Anesthesia Type:    * VIDEO ASSISTED THORACOSCOPY (VATS)/DECORTICATION and Drainage of Empyema - General  Surgeon(s) and Role:    * Linden Dolin, MD - Primary   Indications: The patient was admitted with respiratory distress and found to have left empyema. She is taken to the OR for decortication.     Surgeon: Linden Dolin   Assistants: Jaclyn Prime PA-C  Anesthesia: General endotracheal - Double lumen tube  ASA Class: 4    Procedure Detail  VIDEO ASSISTED THORACOSCOPY (VATS)/DECORTICATION and Drainage of Empyema  After informed consent, she is taken to the OR at [facility] on the above date. Anesthesia is confirmed by GET with double lumen tube. She is then placed in the right lateral decubitus position. The left chest was cleansed and draped with betadine solution. A preoperative pause was performed. Two small incisions were made in the left chest: one for camera port and other for working port. Video examination of the left chest showed early phase empyema with purulence. This was manually evacuated; pleural peel was sent for culture. Once all debris was removed, two chest tubes were placed. Multilevel rib block was performed with exparel solution. The working port wound was closed in layers. Sterile dressings applied. All sponge instrument and needle counts were correct.   Estimated Blood Loss:  Minimal         Drains: 2 chest tubes        Blood Given: none          Specimens: pleural peel and fluid         Implants: none        Complications:  * No complications entered in OR log *         Disposition: PACU - hemodynamically stable.         Condition: stable

## 2019-07-15 NOTE — H&P (Signed)
History and Physical Interval Note:  07/15/2019 1:00 PM  Sally Buck  has presented today for surgery, with the diagnosis of left empyema.  The various methods of treatment have been discussed with the patient and family. After consideration of risks, benefits and other options for treatment, the patient has consented to  Procedure(s): VIDEO ASSISTED THORACOSCOPY (VATS)/DECORTICATION and Drainage of Empyema (Left) as a surgical intervention.  The patient's history has been reviewed, patient examined, no change in status, stable for surgery.  I have reviewed the patient's chart and labs.  Questions were answered to the patient's satisfaction.     Linden Dolin

## 2019-07-15 NOTE — Progress Notes (Signed)
Lower extremity venous has been completed.   Preliminary results in CV Proc.   Blanch Media 07/15/2019 11:42 AM

## 2019-07-15 NOTE — Transfer of Care (Signed)
Immediate Anesthesia Transfer of Care Note  Patient: Sally Buck  Procedure(s) Performed: VIDEO ASSISTED THORACOSCOPY (VATS)/DECORTICATION and Drainage of Empyema (Left Chest)  Patient Location: PACU  Anesthesia Type:General  Level of Consciousness: awake and alert   Airway & Oxygen Therapy: Patient Spontanous Breathing and Patient connected to face mask oxygen  Post-op Assessment: Report given to RN and Post -op Vital signs reviewed and stable  Post vital signs: Reviewed and stable  Last Vitals:  Vitals Value Taken Time  BP 122/77 07/15/19 1441  Temp    Pulse 126   Resp 32 07/15/19 1444  SpO2 97   Vitals shown include unvalidated device data.  Last Pain:  Vitals:   07/15/19 0400  TempSrc: Axillary  PainSc:       Patients Stated Pain Goal: 2 (07/14/19 1829)  Complications: No apparent anesthesia complications

## 2019-07-15 NOTE — Progress Notes (Signed)
PCCM progress note   Sally Buck is a 59yo female who presented with complaint of shortness of breath with associated chest and shoulder pain who was found to have CAP with loculated pleural effusion.  Patient was seen in consultation by PCCM 5/19 and deemed not a candidate for conservative measures given multiple areas of loculated effusion. CTS was consulted and decision made to pursue surgical interventions. Therefore at this time PCCM will sign off. Thank you for the opportunity to participate in this patient's care. Please contact if we can be of further assistance.   Delfin Gant, NP-C [facility] Contact / Pager information can be found on Amion  07/15/2019, 10:03 AM

## 2019-07-15 NOTE — Anesthesia Preprocedure Evaluation (Addendum)
Anesthesia Evaluation  Patient identified by MRN, date of birth, ID band Patient awake    Reviewed: Allergy & Precautions, NPO status , Patient's Chart, lab work & pertinent test results  Airway Mallampati: II  TM Distance: >3 FB Neck ROM: Full    Dental no notable dental hx. (+) Dental Advisory Given, Teeth Intact   Pulmonary asthma , pneumonia, former smoker,   No breath sounds on left  + rhonchi        Cardiovascular negative cardio ROS Normal cardiovascular exam Rhythm:Regular Rate:Normal  Echo 07/15/2019 Prelim read: No significant MR/TR, no AS, EF ~50% with extreme tachy   Neuro/Psych negative neurological ROS  negative psych ROS   GI/Hepatic Neg liver ROS, GERD  ,  Endo/Other  negative endocrine ROS  Renal/GU negative Renal ROS     Musculoskeletal negative musculoskeletal ROS (+)   Abdominal   Peds  Hematology negative hematology ROS (+)   Anesthesia Other Findings   Reproductive/Obstetrics negative OB ROS                           Anesthesia Physical Anesthesia Plan  ASA: III  Anesthesia Plan: General   Post-op Pain Management:    Induction: Intravenous  PONV Risk Score and Plan: 4 or greater and Ondansetron, Dexamethasone, Treatment may vary due to age or medical condition and Midazolam  Airway Management Planned: Double Lumen EBT  Additional Equipment: Arterial line  Intra-op Plan:   Post-operative Plan: Extubation in OR  Informed Consent: I have reviewed the patients History and Physical, chart, labs and discussed the procedure including the risks, benefits and alternatives for the proposed anesthesia with the patient or authorized representative who has indicated his/her understanding and acceptance.     Dental advisory given  Plan Discussed with: CRNA  Anesthesia Plan Comments:      Anesthesia Quick Evaluation

## 2019-07-15 NOTE — Consult Note (Signed)
[address]       [place] [address]             [phone]        Sally Buck [facility] Medical Record #[id number] Date of Birth: 21-Mar-1960  Referring: No ref. provider found Primary Care: Marva Panda, NP Primary Cardiologist:No primary care provider on file.  Chief Complaint:    Chief Complaint  Patient presents with  . Allergic Reaction    History of Present Illness:      59 yo lady with h/o asthma and seasonal allergies presented with respiratory distress and fever. Evaluation showed left lung consolidation and loculated pleural effusion. She underwent left thoracentesis but this only yielded 250 mL. She is referred for consideration of left pulm decortication and evacuation of the left pleural space.  Current Activity/ Functional Status: Patient will be independent with mobility/ambulation, transfers, ADL's, IADL's.   Zubrod Score: At the time of surgery this patient's most appropriate activity status/level should be described as:     0    Normal activity, no symptoms     1    Restricted in physical strenuous activity but ambulatory, able to do out light work     2    Ambulatory and capable of self care, unable to do work activities, up and about                 more than 50%  Of the time                                3    Only limited self care, in bed greater than 50% of waking hours     4    Completely disabled, no self care, confined to bed or chair     5    Moribund  Past Medical History:  Diagnosis Date  . Asthma   . GERD (gastroesophageal reflux disease)   . High cholesterol     Past Surgical History:  Procedure Laterality Date  . ABDOMINAL HYSTERECTOMY    . ANKLE SURGERY    . APPENDECTOMY    . EYE SURGERY    . IR THORACENTESIS ASP PLEURAL SPACE W/IMG GUIDE  07/14/2019    Social History   Tobacco Use  Smoking Status Former Smoker  Smokeless Tobacco Never Used    Social History   Substance  and Sexual Activity  Alcohol Use No     Allergies  Allergen Reactions  . Ampicillin Itching  . Ceclor [Cefaclor] Other (See Comments)    Passed out   . Other Other (See Comments)    travoprost : unknown  . Sulfa Antibiotics   . Tape Itching  . Tetracyclines & Related Itching  . Tylenol With Codeine #3 [Acetaminophen-Codeine] Other (See Comments)    Passed out     Current Facility-Administered Medications  Medication Dose Route Frequency Provider Last Rate Last Admin  . 0.9 %  sodium chloride infusion   Intravenous Continuous Elgergawy, Leana Roe, MD 75 mL/hr at 07/15/19 1007 Rate Change at 07/15/19 1007  . [MAR Hold] acetaminophen (TYLENOL) tablet 650 mg  650 mg Oral Q6H PRN Therisa Doyne, MD   650 mg at 07/15/19 0148  . bupivacaine liposome (EXPAREL) 1.3 % injection 266 mg  20 mL Infiltration Once Linden Dolin, MD      . Mitzi Hansen Hold] ceFEPIme (MAXIPIME) 2 g in sodium  chloride 0.9 % 100 mL IVPB  2 g Intravenous Q12H Elgergawy, Leana Roe, MD 200 mL/hr at 07/15/19 0752 2 g at 07/15/19 0752  . [MAR Hold] docusate sodium (COLACE) capsule 100 mg  100 mg Oral BID Doutova, Anastassia, MD   100 mg at 07/15/19 1021  . [MAR Hold] guaiFENesin-dextromethorphan (ROBITUSSIN DM) 100-10 MG/5ML syrup 5 mL  5 mL Oral Q8H PRN Opyd, Lavone Neri, MD   5 mL at 07/15/19 1028  . [MAR Hold] heparin injection 5,000 Units  5,000 Units Subcutaneous Q8H Elgergawy, Leana Roe, MD   5,000 Units at 07/15/19 1028  . lactated ringers infusion   Intravenous Continuous Lewie Loron, MD 10 mL/hr at 07/15/19 1206 New Bag at 07/15/19 1206  . [MAR Hold] latanoprost (XALATAN) 0.005 % ophthalmic solution 1 drop  1 drop Both Eyes QHS Therisa Doyne, MD   Stopped at 07/14/19 0055  . [MAR Hold] levalbuterol (XOPENEX) nebulizer solution 0.63 mg  0.63 mg Nebulization Q6H PRN Therisa Doyne, MD   0.63 mg at 07/15/19 0132  . [MAR Hold] loratadine (CLARITIN) tablet 10 mg  10 mg Oral Daily Doutova, Anastassia, MD   10  mg at 07/15/19 1021  . [MAR Hold] metroNIDAZOLE (FLAGYL) IVPB 500 mg  500 mg Intravenous Q8H Elgergawy, Leana Roe, MD 100 mL/hr at 07/15/19 0858 500 mg at 07/15/19 0858  . [MAR Hold] mometasone-formoterol (DULERA) 200-5 MCG/ACT inhaler 2 puff  2 puff Inhalation BID Therisa Doyne, MD   2 puff at 07/15/19 1021  . [MAR Hold] ondansetron (ZOFRAN) tablet 4 mg  4 mg Oral Q6H PRN Therisa Doyne, MD       Or  . Mitzi Hansen Hold] ondansetron (ZOFRAN) injection 4 mg  4 mg Intravenous Q6H PRN Doutova, Anastassia, MD      . Mitzi Hansen Hold] oxyCODONE (Oxy IR/ROXICODONE) immediate release tablet 5-10 mg  5-10 mg Oral Q4H PRN Elgergawy, Leana Roe, MD      . Mitzi Hansen Hold] sodium chloride 0.9 % bolus 500 mL  500 mL Intravenous Once Lonia Blood, MD      . Mitzi Hansen Hold] sodium chloride flush (NS) 0.9 % injection 3 mL  3 mL Intravenous Q12H Doutova, Anastassia, MD   3 mL at 07/15/19 1022  . [MAR Hold] traMADol (ULTRAM) tablet 50 mg  50 mg Oral Q6H PRN Lonia Blood, MD      . Mitzi Hansen Hold] vancomycin (VANCOREADY) IVPB 500 mg/100 mL  500 mg Intravenous Q12H Elgergawy, Leana Roe, MD       Facility-Administered Medications Ordered in Other Encounters  Medication Dose Route Frequency Provider Last Rate Last Admin  . lactated ringers infusion   Intravenous Continuous PRN Mayer Camel, CRNA   New Bag at 07/15/19 1230    Medications Prior to Admission  Medication Sig Dispense Refill Last Dose  . albuterol (PROVENTIL HFA;VENTOLIN HFA) 108 (90 BASE) MCG/ACT inhaler Inhale 2 puffs into the lungs every 6 (six) hours as needed for wheezing or shortness of breath.   07/13/2019 at Unknown time  . Ascorbic Acid (VITAMIN C GUMMIE PO) Take 2 tablets by mouth daily.   07/13/2019 at Unknown time  . cetirizine (ZYRTEC) 10 MG tablet Take 10 mg by mouth at bedtime.    07/12/2019 at Unknown time  . Cholecalciferol (VITAMIN D) 1000 UNITS capsule Take 1,000 Units by mouth daily.   07/12/2019 at Unknown time  . cyclobenzaprine (FLEXERIL) 5  MG tablet Take 5 mg by mouth 3 (three) times daily as needed for muscle spasms.  Past Week at Unknown time  . fluticasone (FLONASE) 50 MCG/ACT nasal spray Place 1 spray into both nostrils daily.   Past Week at Unknown time  . Fluticasone-Salmeterol (ADVAIR) 250-50 MCG/DOSE AEPB Inhale 1 puff into the lungs 2 (two) times daily.   07/13/2019 at Unknown time  . latanoprost (XALATAN) 0.005 % ophthalmic solution Place 1 drop into both eyes at bedtime.    07/12/2019 at Unknown time  . meloxicam (MOBIC) 15 MG tablet Take 15 mg by mouth daily.   07/13/2019 at Unknown time  . Multiple Vitamins-Minerals (MULTIVITAMIN WITH MINERALS) tablet Take 1 tablet by mouth daily.   07/12/2019 at Unknown time  . vitamin E 200 UNIT capsule Take 200 Units by mouth daily.   Past Week at Unknown time    Family History  Problem Relation Age of Onset  . CAD Mother   . Diabetes Sister   . Diabetes Brother      Review of Systems:   ROS A comprehensive review of systems was negative.     Cardiac Review of Systems: Y or  [    ]= no  Chest Pain [    ]  Resting SOB [   ] Exertional SOB  [  ]  Orthopnea [  ]   Pedal Edema [   ]    Palpitations [  ] Syncope  [  ]   Presyncope [   ]  General Review of Systems: [Y] = yes [  ]=no Constitional: recent weight change [  ]; anorexia [  ]; fatigue [  ]; nausea [  ]; night sweats [  ]; fever [  ]; or chills [  ]                                                               Dental: Last Dentist visit:   Eye : blurred vision [  ]; diplopia [   ]; vision changes [  ];  Amaurosis fugax[  ]; Resp: cough [  ];  wheezing[  ];  hemoptysis[  ]; shortness of breath[  ]; paroxysmal nocturnal dyspnea[  ]; dyspnea on exertion[  ]; or orthopnea[  ];  GI:  gallstones[  ], vomiting[  ];  dysphagia[  ]; melena[  ];  hematochezia [  ]; heartburn[  ];   Hx of  Colonoscopy[  ]; GU: kidney stones [  ]; hematuria[  ];   dysuria [  ];  nocturia[  ];  history of     obstruction [  ]; urinary frequency [   ]             Skin: rash, swelling[  ];, hair loss[  ];  peripheral edema[  ];  or itching[  ]; Musculosketetal: myalgias[  ];  joint swelling[  ];  joint erythema[  ];  joint pain[  ];  back pain[  ];  Heme/Lymph: bruising[  ];  bleeding[  ];  anemia[  ];  Neuro: TIA[  ];  headaches[  ];  stroke[  ];  vertigo[  ];  seizures[  ];   paresthesias[  ];  difficulty walking[  ];  Psych:depression[  ]; anxiety[  ];  Endocrine: diabetes[  ];  thyroid dysfunction[  ];  Physical Exam: BP (!) 143/90   Pulse (!) 116   Temp 98.1 F (36.7 C)   Resp (!) 26   Ht 4\' 9"  (1.448 m)   Wt 43.5 kg   SpO2 96%   BMI 20.73 kg/m    General appearance: alert and cooperative Head: Normocephalic, without obvious abnormality, atraumatic Resp: diminished breath sounds LLL Back: no tenderness to percussion or palpation, symmetric, no curvature. ROM normal. No CVA tenderness. Cardio: tachycardiac, regular rhythm GI: soft, non-tender; bowel sounds normal; no masses,  no organomegaly Extremities: extremities normal, atraumatic, no cyanosis or edema Neurologic: Alert and oriented X 3, normal strength and tone. Normal symmetric reflexes. Normal coordination and gait  Diagnostic Studies & Laboratory data:     Recent Radiology Findings:   DG Chest 1 View  Result Date: 07/14/2019 CLINICAL DATA:  Status post thoracentesis on the left EXAM: CHEST  1 VIEW COMPARISON:  07/13/2019 FINDINGS: Cardiac shadow is stable. Slight reduction in pleural fluid is noted when compare with the prior exam. No pneumothorax is seen. Slight increased density is noted throughout the left lung which may be related to some re-expansion edema. No bony abnormality is noted. IMPRESSION: No pneumothorax following thoracentesis. There are however changes suggestive of re-expansion edema. Electronically Signed   By: 07/15/2019 M.D.   On: 07/14/2019 08:54   DG Chest 2 View  Result Date: 07/13/2019 CLINICAL DATA:  Chest pain, shortness  of breath. EXAM: CHEST - 2 VIEW COMPARISON:  October 21, 2014. FINDINGS: The heart size and mediastinal contours are within normal limits. No pneumothorax is noted. Right lung is clear. Mild left pleural effusion is noted with associated left basilar infiltrate or atelectasis. The visualized skeletal structures are unremarkable. IMPRESSION: Mild left pleural effusion is noted with associated left basilar infiltrate or atelectasis. Electronically Signed   By: October 23, 2014 M.D.   On: 07/13/2019 16:47   CT CHEST WO CONTRAST  Result Date: 07/14/2019 CLINICAL DATA:  And pain in a status post thoracentesis EXAM: CT CHEST WITHOUT CONTRAST TECHNIQUE: Multidetector CT imaging of the chest was performed following the standard protocol without IV contrast. COMPARISON:  07/13/2019 FINDINGS: Cardiovascular: Heart is unremarkable without pericardial effusion. Grossly normal caliber of the thoracic aorta. Evaluation limited without intravenous contrast. Mediastinum/Nodes: Evaluation is limited without IV contrast. The thyroid, trachea, and esophagus are grossly unremarkable. No discrete adenopathy. Lungs/Pleura: Since the prior CT, there is been significant increase in the size of the multilocular complex left pleural effusion. There is progressive consolidation throughout the left lung, with relative sparing of the left apex. No left-sided pneumothorax after thoracentesis. There is a small free-flowing right pleural effusion. Patchy consolidation right lower lobe likely atelectasis. The central airways are patent. Upper Abdomen: No acute abnormality. Musculoskeletal: No acute or destructive bony lesions. Reconstructed images demonstrate no additional findings. IMPRESSION: 1. Enlarging multilocular complex left pleural effusion, compatible with empyema. 2. Progressive left lung consolidation with relative sparing of the left apex. This could reflect underlying pneumonia. 3. Trace free-flowing right pleural effusion, new  since prior study. Electronically Signed   By: 07/15/2019 M.D.   On: 07/14/2019 20:08   CT Angio Chest PE W and/or Wo Contrast  Result Date: 07/13/2019 CLINICAL DATA:  Shortness of breath with left-sided chest pain. EXAM: CT ANGIOGRAPHY CHEST WITH CONTRAST TECHNIQUE: Multidetector CT imaging of the chest was performed using the standard protocol during bolus administration of intravenous contrast. Multiplanar CT image reconstructions and MIPs were obtained to evaluate the vascular anatomy. CONTRAST:  96mL OMNIPAQUE IOHEXOL 350 MG/ML SOLN COMPARISON:  None. FINDINGS: Cardiovascular: Heart size normal. Small pericardial effusion evident. No thoracic aortic aneurysm. No filling defects within the opacified pulmonary arteries to suggest the presence of an acute pulmonary embolus Mediastinum/Nodes: 11 mm short axis AP window lymph node visible on image 44/5 nodular soft tissue is identified in the anterior mediastinum, potentially related to small clustered lymph nodes. No right hilar lymphadenopathy. There is abnormal soft tissue attenuation in the left hilum. There is no axillary lymphadenopathy. Lungs/Pleura: Ground-glass opacity is seen fairly diffusely in the left upper lobe with diffuse consolidation of the lingula with airspace consolidation tracking centrally into the left hilum. There is left lower lobe collapse inferiorly. These changes are associated with a moderate left pleural effusion demonstrating loculation in the apex and anterior upper hemithorax. Nodular pleural thickening or loculated fluid is seen along the medial pleura adjacent to the transverse aorta. No right pleural effusion. Upper Abdomen: Unremarkable. Musculoskeletal: No worrisome lytic or sclerotic osseous abnormality. Review of the MIP images confirms the above findings. IMPRESSION: 1. No CT evidence for acute pulmonary embolus. 2. Diffuse ground-glass opacity in the left upper lobe with diffuse consolidation of the lingula tracking  centrally into the left hilum. This is associated with a moderate left pleural effusion demonstrating loculation and prominent areas of left lower lobe atelectasis. Nodular pleural thickening or loculated fluid is identified along the medial pleura adjacent to the transverse aorta. Mildly enlarged AP window lymph node with question of soft tissue nodularity/clustered lymph nodes in the anterior mediastinum. These changes may all be related to pneumonia, given the apparent pleural nodularity towards the apex, neoplasm is a distinct concern. Close follow-up recommended. Electronically Signed   By: Misty Stanley M.D.   On: 07/13/2019 20:44   DG Chest Port 1 View  Result Date: 07/15/2019 CLINICAL DATA:  Empyema. EXAM: PORTABLE CHEST 1 VIEW COMPARISON:  CT 07/14/2019.  Chest x-ray 07/14/2019. FINDINGS: Complete opacification left hemithorax is noted on today's exam. This is most consistent with reaccumulation of left pleural fluid/progressive empyema. Underlying pulmonary infiltrate cannot be excluded. No pneumothorax. Heart size cannot be evaluated no acute bony abnormality. IMPRESSION: Complete opacification left hemithorax is noted on today's exam. This is most consistent with reaccumulation of left pleural fluid/progressive empyema. Electronically Signed   By: Marcello Moores  Register   On: 07/15/2019 08:40   ECHOCARDIOGRAM COMPLETE  Result Date: 07/15/2019    ECHOCARDIOGRAM REPORT   Patient Name:   Sally Buck Date of Exam: 07/15/2019 Medical Rec #:  [id number]              Height:       57.0 in Accession #:    [id number]             Weight:       95.8 lb Date of Birth:  1960/12/10               BSA:          1.315 m Patient Age:    49 years               BP:           143/90 mmHg Patient Gender: F                      HR:           120 bpm. Exam Location:  Inpatient Procedure: 2D Echo Indications:    Elevated Troponin  History:        Patient has no prior history of Echocardiogram examinations.                  Signs/Symptoms:Shortness of Breath. GERD. Shortness of breath                 with associated chest and shoulder pain, fever. Sepsis secondary                 to pneumonia, with loculated empyema.  Sonographer:    Leta Jungling RDCS Referring Phys: [id number] ANASTASSIA DOUTOVA IMPRESSIONS  1. Left ventricular ejection fraction, by estimation, is 55%. The left ventricle has normal function. The left ventricle has no regional wall motion abnormalities. Left ventricular diastolic parameters are indeterminate.  2. Right ventricular systolic function is normal. The right ventricular size is normal. Tricuspid regurgitation signal is inadequate for assessing PA pressure.  3. The mitral valve is normal in structure. Trivial mitral valve regurgitation. No evidence of mitral stenosis.  4. The aortic valve is normal in structure. Aortic valve regurgitation is not visualized. No aortic stenosis is present.  5. Dilated pulmonary artery.  6. The inferior vena cava is normal in size with greater than 50% respiratory variability, suggesting right atrial pressure of 3 mmHg. FINDINGS  Left Ventricle: Left ventricular ejection fraction, by estimation, is 55%. The left ventricle has normal function. The left ventricle has no regional wall motion abnormalities. The left ventricular internal cavity size was normal in size. There is no left ventricular hypertrophy. Left ventricular diastolic parameters are indeterminate. Right Ventricle: The right ventricular size is normal. No increase in right ventricular wall thickness. Right ventricular systolic function is normal. Tricuspid regurgitation signal is inadequate for assessing PA pressure. Left Atrium: Left atrial size was normal in size. Right Atrium: Right atrial size was normal in size. Pericardium: There is no evidence of pericardial effusion. Mitral Valve: The mitral valve is normal in structure. Normal mobility of the mitral valve leaflets. Trivial mitral valve regurgitation. No  evidence of mitral valve stenosis. Tricuspid Valve: The tricuspid valve is normal in structure. Tricuspid valve regurgitation is not demonstrated. No evidence of tricuspid stenosis. Aortic Valve: The aortic valve is normal in structure. Aortic valve regurgitation is not visualized. No aortic stenosis is present. Pulmonic Valve: The pulmonic valve was normal in structure. Pulmonic valve regurgitation is not visualized. No evidence of pulmonic stenosis. Aorta: The aortic root is normal in size and structure. Pulmonary Artery: The pulmonary artery is dilated. Venous: The inferior vena cava is normal in size with greater than 50% respiratory variability, suggesting right atrial pressure of 3 mmHg. IAS/Shunts: There is redundancy of the interatrial septum. No atrial level shunt detected by color flow Doppler.  LEFT VENTRICLE PLAX 2D LVIDd:         3.70 cm LVIDs:         2.70 cm LV PW:         0.70 cm LV IVS:        0.90 cm LVOT diam:     1.90 cm LV SV:         34 LV SV Index:   26 LVOT Area:     2.84 cm  LV Volumes (MOD) LV vol d, MOD A2C: 73.8 ml LV vol d, MOD A4C: 71.2 ml LV vol s, MOD A2C: 34.9 ml LV vol s, MOD A4C: 36.1 ml LV SV MOD A2C:     38.9 ml LV SV MOD A4C:  71.2 ml LV SV MOD BP:      37.4 ml RIGHT VENTRICLE RV S prime:     17.70 cm/s TAPSE (M-mode): 1.9 cm LEFT ATRIUM             Index       RIGHT ATRIUM          Index LA diam:        3.00 cm 2.28 cm/m  RA Area:     7.41 cm LA Vol (A2C):   26.7 ml 20.30 ml/m RA Volume:   12.50 ml 9.50 ml/m LA Vol (A4C):   23.3 ml 17.71 ml/m LA Biplane Vol: 27.2 ml 20.68 ml/m  AORTIC VALVE LVOT Vmax:   89.50 cm/s LVOT Vmean:  61.600 cm/s LVOT VTI:    0.120 m  AORTA Ao Root diam: 2.90 cm Ao Asc diam:  2.70 cm  SHUNTS Systemic VTI:  0.12 m Systemic Diam: 1.90 cm Weston Brass MD Electronically signed by Weston Brass MD Signature Date/Time: 07/15/2019/12:54:26 PM    Final    VAS [place] LOWER EXTREMITY VENOUS (DVT)  Result Date: 07/15/2019  Lower Venous DVTStudy  Indications: Elevated ddimer.  Comparison Study: no prior Performing Technologist: Blanch Media RVS  Examination Guidelines: A complete evaluation includes B-mode imaging, spectral Doppler, color Doppler, and power Doppler as needed of all accessible portions of each vessel. Bilateral testing is considered an integral part of a complete examination. Limited examinations for reoccurring indications may be performed as noted. The reflux portion of the exam is performed with the patient in reverse Trendelenburg.  +---------+---------------+---------+-----------+----------+--------------+ RIGHT    CompressibilityPhasicitySpontaneityPropertiesThrombus Aging +---------+---------------+---------+-----------+----------+--------------+ CFV      Full           Yes      Yes                                 +---------+---------------+---------+-----------+----------+--------------+ SFJ      Full                                                        +---------+---------------+---------+-----------+----------+--------------+ FV Prox  Full                                                        +---------+---------------+---------+-----------+----------+--------------+ FV Mid   Full                                                        +---------+---------------+---------+-----------+----------+--------------+ FV DistalFull                                                        +---------+---------------+---------+-----------+----------+--------------+ PFV      Full                                                        +---------+---------------+---------+-----------+----------+--------------+  POP      Full           Yes      Yes                                 +---------+---------------+---------+-----------+----------+--------------+ PTV      Full                                                         +---------+---------------+---------+-----------+----------+--------------+ PERO     Full                                                        +---------+---------------+---------+-----------+----------+--------------+   +---------+---------------+---------+-----------+----------+--------------+ LEFT     CompressibilityPhasicitySpontaneityPropertiesThrombus Aging +---------+---------------+---------+-----------+----------+--------------+ CFV      Full           Yes      Yes                                 +---------+---------------+---------+-----------+----------+--------------+ SFJ      Full                                                        +---------+---------------+---------+-----------+----------+--------------+ FV Prox  Full                                                        +---------+---------------+---------+-----------+----------+--------------+ FV Mid   Full                                                        +---------+---------------+---------+-----------+----------+--------------+ FV DistalFull                                                        +---------+---------------+---------+-----------+----------+--------------+ PFV      Full                                                        +---------+---------------+---------+-----------+----------+--------------+ POP      Full           Yes      Yes                                 +---------+---------------+---------+-----------+----------+--------------+  PTV      Full                                                        +---------+---------------+---------+-----------+----------+--------------+ PERO     Full                                                        +---------+---------------+---------+-----------+----------+--------------+     Summary: BILATERAL: - No evidence of deep vein thrombosis seen in the lower extremities, bilaterally. - RIGHT: - A  cystic structure is found in the popliteal fossa.  LEFT: - No cystic structure found in the popliteal fossa.  *See table(s) above for measurements and observations.    Preliminary    IR THORACENTESIS ASP PLEURAL SPACE W/IMG GUIDE  Result Date: 07/14/2019 INDICATION: Patient with history of recurrent pneumonia, presents with shortness of breath and left pleural effusion. Request is made for diagnostic and therapeutic left thoracentesis. EXAM: ULTRASOUND GUIDED DIAGNOSTIC AND THERAPEUTIC LEFT THORACENTESIS MEDICATIONS: 10 mL 1% lidocaine COMPLICATIONS: SIR Level A - No therapy, no consequence. PROCEDURE: An ultrasound guided thoracentesis was thoroughly discussed with the patient and questions answered. The benefits, risks, alternatives and complications were also discussed. The patient understands and wishes to proceed with the procedure. Written consent was obtained. Ultrasound was performed to localize and mark an adequate pocket of fluid in the left chest. The area was then prepped and draped in the normal sterile fashion. 1% Lidocaine was used for local anesthesia. Under ultrasound guidance a 6 Fr Safe-T-Centesis catheter was introduced. Thoracentesis was performed. The catheter was removed and a dressing applied. FINDINGS: A total of approximately 250 mL of cloudy, purulent-appearing fluid was removed. Samples were sent to the laboratory as requested by the clinical team. Postprocedure chest x-ray shows show suggestion of re-expansion edema. IMPRESSION: Successful ultrasound guided diagnostic and therapeutic thoracentesis yielding 250 mL of pleural fluid. Read by: Loyce DysKacie Matthews PA-C Electronically Signed   By: Gilmer MorJaime  Wagner D.O.   On: 07/14/2019 11:36     I have independently reviewed the above radiologic studies and discussed with the patient   Recent Lab Findings: Lab Results  Component Value Date   WBC 32.4 (H) 07/15/2019   HGB 12.6 07/15/2019   HCT 37.3 07/15/2019   PLT 415 (H) 07/15/2019    GLUCOSE 123 (H) 07/15/2019   ALT 36 07/15/2019   AST 23 07/15/2019   NA 132 (L) 07/15/2019   K 3.7 07/15/2019   CL 99 07/15/2019   CREATININE 0.72 07/15/2019   BUN 9 07/15/2019   CO2 21 (L) 07/15/2019   TSH 1.156 07/14/2019   INR 1.3 (H) 07/15/2019      Assessment / Plan:      Plan OR for left VATS decortication on 07/15/19.      I  spent 30 minutes counseling the patient face to face.   Trevar Boehringer Z. Vickey SagesAtkins, MD [phone] 07/15/2019 1:08 PM

## 2019-07-15 NOTE — Anesthesia Postprocedure Evaluation (Signed)
Anesthesia Post Note  Patient: Sally Buck  Procedure(s) Performed: VIDEO ASSISTED THORACOSCOPY (VATS)/DECORTICATION and Drainage of Empyema (Left Chest)     Patient location during evaluation: PACU Anesthesia Type: General Level of consciousness: sedated and patient cooperative Pain management: pain level controlled Vital Signs Assessment: post-procedure vital signs reviewed and stable Respiratory status: spontaneous breathing Cardiovascular status: stable Anesthetic complications: no    Last Vitals:  Vitals:   07/15/19 1720 07/15/19 1820  BP: 120/68 137/77  Pulse: (!) 126 (!) 127  Resp: (!) 22 (!) 25  Temp: 37 C 36.4 C  SpO2: 90% 94%    Last Pain:  Vitals:   07/15/19 1820  TempSrc: Oral  PainSc:                  Sally Buck

## 2019-07-15 NOTE — Progress Notes (Signed)
RT NOTES: ABG obtained from [facility] and sent to lab. Lab tech [place] notified.

## 2019-07-15 NOTE — Progress Notes (Signed)
  Echocardiogram 2D Echocardiogram has been performed.  Leta Jungling M 07/15/2019, 12:33 PM

## 2019-07-16 ENCOUNTER — Inpatient Hospital Stay (HOSPITAL_COMMUNITY)

## 2019-07-16 DIAGNOSIS — A4102 Sepsis due to Methicillin resistant Staphylococcus aureus: Principal | ICD-10-CM

## 2019-07-16 DIAGNOSIS — E871 Hypo-osmolality and hyponatremia: Secondary | ICD-10-CM | POA: Diagnosis not present

## 2019-07-16 DIAGNOSIS — R6520 Severe sepsis without septic shock: Secondary | ICD-10-CM | POA: Diagnosis not present

## 2019-07-16 DIAGNOSIS — J9601 Acute respiratory failure with hypoxia: Secondary | ICD-10-CM

## 2019-07-16 DIAGNOSIS — J869 Pyothorax without fistula: Secondary | ICD-10-CM | POA: Diagnosis not present

## 2019-07-16 LAB — BASIC METABOLIC PANEL
Anion gap: 7 (ref 5–15)
BUN: 10 mg/dL (ref 8–23)
CO2: 24 mmol/L (ref 22–32)
Calcium: 8.2 mg/dL — ABNORMAL LOW (ref 8.9–10.3)
Chloride: 100 mmol/L (ref 98–111)
Creatinine, Ser: 0.51 mg/dL (ref 0.44–1.00)
GFR calc Af Amer: >60 mL/min (ref >60)
GFR calc non Af Amer: >60 mL/min (ref >60)
Glucose, Bld: 200 mg/dL — ABNORMAL HIGH (ref 70–99)
Potassium: 4.6 mmol/L (ref 3.5–5.1)
Sodium: 131 mmol/L — ABNORMAL LOW (ref 135–145)

## 2019-07-16 LAB — CBC
HCT: 33.5 % — ABNORMAL LOW (ref 36.0–46.0)
Hemoglobin: 11.3 g/dL — ABNORMAL LOW (ref 12.0–15.0)
MCH: 30.4 pg (ref 26.0–34.0)
MCHC: 33.7 g/dL (ref 30.0–36.0)
MCV: 90.1 fL (ref 80.0–100.0)
Platelets: 367 10*3/uL (ref 150–400)
RBC: 3.72 MIL/uL — ABNORMAL LOW (ref 3.87–5.11)
RDW: 12 % (ref 11.5–15.5)
WBC: 35.4 10*3/uL — ABNORMAL HIGH (ref 4.0–10.5)
nRBC: 0 % (ref 0.0–0.2)

## 2019-07-16 LAB — CULTURE, BODY FLUID W GRAM STAIN -BOTTLE

## 2019-07-16 LAB — PROCALCITONIN: Procalcitonin: 6.13 ng/mL

## 2019-07-16 MED ORDER — PNEUMOCOCCAL VAC POLYVALENT 25 MCG/0.5ML IJ INJ: 0.5000 mL | INJECTION | INTRAMUSCULAR | Status: DC

## 2019-07-16 NOTE — Evaluation (Signed)
Physical Therapy Evaluation Patient Details Name: Sally Buck MRN: [id number] DOB: Aug 01, 1960 Today's Date: 07/16/2019   History of Present Illness  Patient is a 59 year old female admitted with cough that has progressively been getting worse.  Pt dx with left empyema s/p VATs 5/20.PMhx: asthma  Clinical Impression  Pt very pleasant and eager to get out of the chair stating fatigue from sitting up for several hours. 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 Pt on RA throughout mobility with sats 90-91% with drops to 88% during gait able to recover with cues for pursed lip breathing and standing rest. Pt returned to 1L end of session with sats 93%. Pt with decreased activity tolerance, pulmonary function and gait who will benefit from acute therapy to maximize mobility and independence for return home.      Follow Up Recommendations No PT follow up    Equipment Recommendations  Rolling walker with 5" wheels    Recommendations for Other Services       Precautions / Restrictions Precautions Precaution Comments: watch sats      Mobility  Bed Mobility Overal bed mobility: Needs Assistance Bed Mobility: Sit to Supine       Sit to supine: Supervision   General bed mobility comments: for lines  Transfers Overall transfer level: Needs assistance   Transfers: Sit to/from Stand Sit to Stand: Supervision         General transfer comment: supervision for lines  Ambulation/Gait Ambulation/Gait assistance: Min guard Gait Distance (Feet): 120 Feet Assistive device: Rolling walker (2 wheeled) Gait Pattern/deviations: Step-through pattern;Decreased stride length   Gait velocity interpretation: 1.31 - 2.62 ft/sec, indicative of limited community ambulator General Gait Details: decreased speed and distance but good stability with use of RW  Stairs            Wheelchair Mobility    Modified Rankin (Stroke  Patients Only)       Balance Overall balance assessment: No apparent balance deficits (not formally assessed)                                           Pertinent Vitals/Pain Pain Assessment: No/denies pain    Home Living Family/patient expects to be discharged to:: Private residence Living Arrangements: Alone Available Help at Discharge: Family;Available PRN/intermittently Type of Home: House Home Access: Stairs to enter   [employer] of Steps: 1 Home Layout: One level Home Equipment: None      Prior Function Level of Independence: Independent         Comments: 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     Hand Dominance        Extremity/Trunk Assessment   Upper Extremity Assessment Upper Extremity Assessment: Overall WFL for tasks assessed    Lower Extremity Assessment Lower Extremity Assessment: Overall WFL for tasks assessed    Cervical / Trunk Assessment Cervical / Trunk Assessment: Normal  Communication   Communication: No difficulties  Cognition Arousal/Alertness: Awake/alert Behavior During Therapy: WFL for tasks assessed/performed Overall Cognitive Status: Within Functional Limits for tasks assessed                                        General Comments  Exercises     Assessment/Plan    PT Assessment Patient needs continued PT services  PT Problem List Decreased mobility;Decreased activity tolerance;Cardiopulmonary status limiting activity       PT Treatment Interventions DME instruction;Therapeutic exercise;Gait training;Functional mobility training;Therapeutic activities;Patient/family education;Stair training    PT Goals (Current goals can be found in the Care Plan section)  Acute Rehab PT Goals Patient Stated Goal: be able to return to work PT Goal Formulation: With patient Time For Goal Achievement: 07/30/19 Potential to Achieve Goals: Good    Frequency Min 3X/week    Barriers to discharge Decreased caregiver support      Co-evaluation               AM-PAC PT "6 Clicks" Mobility  Outcome Measure Help needed turning from your back to your side while in a flat bed without using bedrails?: None Help needed moving from lying on your back to sitting on the side of a flat bed without using bedrails?: None Help needed moving to and from a bed to a chair (including a wheelchair)?: A Little Help needed standing up from a chair using your arms (e.g., wheelchair or bedside chair)?: A Little Help needed to walk in hospital room?: A Little Help needed climbing 3-5 steps with a railing? : A Little 6 Click Score: 20    End of Session Equipment Utilized During Treatment: Oxygen Activity Tolerance: Patient tolerated treatment well Patient left: in chair;with call bell/phone within reach;with chair alarm set Nurse Communication: Mobility status PT Visit Diagnosis: Other abnormalities of gait and mobility (R26.89);Difficulty in walking, not elsewhere classified (R26.2)    Time: 3474-2595 PT Time Calculation (min) (ACUTE ONLY): 23 min   Charges:   PT Evaluation $PT Eval Moderate Complexity: 1 Mod          Maija P, PT Acute Rehabilitation Services Pager: [phone] Office: [phone]   [name] Prater 07/16/2019, 12:48 PM

## 2019-07-16 NOTE — Progress Notes (Signed)
PROGRESS NOTE                                                                                                                                                                                                             Patient Demographics:    Sally Buck, is a 59 y.o. female, DOB - 10-25-1960, OTL:[id number]  Admit date - 07/13/2019   Admitting Physician Therisa Doyne, MD  Outpatient Primary MD for the patient is Marva Panda, NP  LOS - 3   Chief Complaint  Patient presents with  . Allergic Reaction       Brief Narrative    59 yo female with past medical history of recurrent pneumonia, GERD, and Hyperlipidemia who presented with a chief complaint of shortness of breath with associated chest and shoulder pain, progressive shortness of breath, fever and chills, patient reports she has had multiple episodes of pneumonia in the past ,On admission patient was seen febrile, tachypneic, tachycardic,Chest CT was then obtained that revealed significant for loculated pleural effusion vs empyema. IR was consulted and preformed US guided left thoracentesis that yielded of cloudy thick purulent-appearing fluid. Post thoracentesis patient continues to remain febrile with worsening leukocytosis, increased work of breathing, PCCM were consulted, recommendation for CT surgery evaluation and surgery.    Subjective:    Sally Buck today reports she is feeling better, and her pain is controlled.   Assessment  & Plan :    Active Problems:   CAP (community acquired pneumonia)   Empyema (HCC)   Tachycardia   Sepsis (HCC)   Dehydration   Hyponatremia  Acute hypoxic resp failute and Sepsis secondary to pneumonia, with loculated empyema. -Sepsis present on admission, leukocytosis, tachypneic, tachycardic and hypoxia with elevated procalcitonin. -Blood cultures no growth to date, postthoracentesis to 50 cc of cloudy thick purulent drainage, wit  pleural effusion growing MRSA initially status post thoracentesis , -Patient with worsening sepsis, leukocytosis and increased work of breathing secondary to her to loculated empyema, went for VATS, and empyema evacuation by CT surgery 5/20 . -Currently on broad-spectrum antibiotic vancomycin, cefepime and Flagyl, centesis cultures growing MRSA, will follow on intraoperative cultures, and if they still grow the same of MRSA, then will discontinue cefepime and Flagyl and continue with vancomycin. -Management of chest tube, and pain management per CT surgery.   COVID-19 Labs  Recent Labs    07/13/19 1730  DDIMER 3.59*    Lab Results  Component Value Date   SARSCOV2NAA NEGATIVE 07/13/2019   Elevated D-dimers -Is most likely in the setting of severe chest infection, CTA chest negative for PE, venous Dopplers negative for DVT .  Hyponatremia -Mild, symptomatic, continue to monitor  Hypomagnesemia -Repleted  History of asthma -No active wheezing   Code Status : Full  Family Communication  : None at bedside.  Disposition Plan  :  Status is: Inpatient  Remains inpatient appropriate because:Hemodynamically unstable   Dispo: The patient is from: Home              Anticipated d/c is to: Home              Anticipated d/c date is: > 3 days              Patient currently is not medically stable to d/c.      Consults  :  PCCM , CT surgery  Procedures  :  -Thoracentesis by IR. -  VIDEO ASSISTED THORACOSCOPY (VATS)/DECORTICATION and Drainage of Empyema (Left)  5/20 by Dr. Vickey Sages  DVT Prophylaxis  : [place] Lovenox  Lab Results  Component Value Date   PLT 367 07/16/2019    Antibiotics  :    Anti-infectives (From admission, onward)   Start     Dose/Rate Route Frequency Ordered Stop   07/15/19 2200  vancomycin (VANCOREADY) IVPB 500 mg/100 mL     500 mg 100 mL/hr over 60 Minutes Intravenous Every 12 hours 07/15/19 0723     07/15/19 1623  ceFAZolin (ANCEF) IVPB 2g/100 mL  premix     2 g 200 mL/hr over 30 Minutes Intravenous 30 min pre-op 07/15/19 1623     07/15/19 0800  metroNIDAZOLE (FLAGYL) IVPB 500 mg     500 mg 100 mL/hr over 60 Minutes Intravenous Every 8 hours 07/15/19 0711     07/15/19 0800  vancomycin (VANCOCIN) IVPB 1000 mg/200 mL premix     1,000 mg 200 mL/hr over 60 Minutes Intravenous  Once 07/15/19 0723 07/15/19 1124   07/15/19 0800  ceFEPIme (MAXIPIME) 2 g in sodium chloride 0.9 % 100 mL IVPB     2 g 200 mL/hr over 30 Minutes Intravenous Every 12 hours 07/15/19 0723     07/14/19 2200  cefTRIAXone (ROCEPHIN) 2 g in sodium chloride 0.9 % 100 mL IVPB  Status:  Discontinued     2 g 200 mL/hr over 30 Minutes Intravenous Every 24 hours 07/13/19 2352 07/15/19 0711   07/14/19 2000  azithromycin (ZITHROMAX) 500 mg in sodium chloride 0.9 % 250 mL IVPB  Status:  Discontinued     500 mg 250 mL/hr over 60 Minutes Intravenous Every 24 hours 07/13/19 2352 07/14/19 1559   07/14/19 0000  cefTRIAXone (ROCEPHIN) 2 g in sodium chloride 0.9 % 100 mL IVPB     2 g 200 mL/hr over 30 Minutes Intravenous  Once 07/13/19 2357 07/14/19 0235   07/13/19 1715  levofloxacin (LEVAQUIN) IVPB 750 mg     750 mg 100 mL/hr over 90 Minutes Intravenous  Once 07/13/19 1706 07/13/19 1937        Objective:   Vitals:   07/16/19 0325 07/16/19 0434 07/16/19 0700 07/16/19 0804  BP: 125/81 114/71    Pulse: 99 98 (!) 107   Resp: 17 20 (!) 26   Temp: 97.8 F (36.6 C) 97.8 F (36.6 C) 97.6 F (36.4 C)   TempSrc:  Oral Oral Oral   SpO2: 97% 96% 90% 96%  Weight:      Height:        Wt Readings from Last 3 Encounters:  07/13/19 43.5 kg  07/11/15 42 kg  10/21/14 42.2 kg     Intake/Output Summary (Last 24 hours) at 07/16/2019 1054 Last data filed at 07/16/2019 0700 Gross per 24 hour  Intake 5982.79 ml  Output 905 ml  Net 5077.79 ml     Physical Exam  Awake Alert, Oriented X 3, No new F.N deficits, Normal affect, much more comfortable today, sitting in  recliner Symmetrical Chest wall movement, diminished lung sounds in the left lung RRR,No Gallops,Rubs or new Murmurs, No Parasternal Heave +ve B.Sounds, Abd Soft, No tenderness, No rebound - guarding or rigidity. No Cyanosis, Clubbing or edema, No new Rash or bruise       Data Review:    CBC Recent Labs  Lab 07/13/19 1608 07/14/19 0517 07/15/19 0426 07/15/19 1730 07/16/19 0326  WBC 8.6 16.8* 32.4* 37.6* 35.4*  HGB 12.8 12.0 12.6 11.7* 11.3*  HCT 38.8 36.0 37.3 35.0* 33.5*  PLT 416* 390 415* 399 367  MCV 89.0 89.6 90.5 90.2 90.1  MCH 29.4 29.9 30.6 30.2 30.4  MCHC 33.0 33.3 33.8 33.4 33.7  RDW 11.4* 11.6 11.9 12.0 12.0  LYMPHSABS  --  0.3*  --   --   --   MONOABS  --  0.9  --   --   --   EOSABS  --  0.0  --   --   --   BASOSABS  --  0.1  --   --   --     Chemistries  Recent Labs  Lab 07/13/19 1608 07/14/19 0517 07/15/19 0426 07/15/19 1730 07/16/19 0326  NA 131* 131* 132* 133* 131*  K 4.3 4.0 3.7 4.1 4.6  CL 92* 98 99 100 100  CO2 24 23 21* 24 24  GLUCOSE 177* 126* 123* 154* 200*  BUN 12 7* 9 10 10   CREATININE 0.68 0.65 0.72 0.64 0.51  CALCIUM 9.3 8.5* 8.4* 8.1* 8.2*  MG  --  1.5* 2.1  --   --   AST  --  34 23 29  --   ALT  --  54* 36 34  --   ALKPHOS  --  140* 144* 135*  --   BILITOT  --  1.6* 1.3* 0.9  --    ------------------------------------------------------------------------------------------------------------------ No results for input(s): CHOL, HDL, LDLCALC, TRIG, CHOLHDL, LDLDIRECT in the last 72 hours.  No results found for: HGBA1C ------------------------------------------------------------------------------------------------------------------ Recent Labs    07/14/19 0517  TSH 1.156   ------------------------------------------------------------------------------------------------------------------ No results for input(s): VITAMINB12, FOLATE, FERRITIN, TIBC, IRON, RETICCTPCT in the last 72 hours.  Coagulation profile Recent Labs  Lab  07/15/19 0426 07/15/19 1730  INR 1.3* 1.3*    Recent Labs    07/13/19 1730  DDIMER 3.59*    Cardiac Enzymes No results for input(s): CKMB, TROPONINI, MYOGLOBIN in the last 168 hours.  Invalid input(s): CK ------------------------------------------------------------------------------------------------------------------ No results found for: BNP  Inpatient Medications  Scheduled Meds: . acetaminophen  1,000 mg Oral Q6H   Or  . acetaminophen (TYLENOL) oral liquid 160 mg/5 mL  1,000 mg Oral Q6H  . bisacodyl  10 mg Oral Daily  . bupivacaine liposome  20 mL Infiltration Once  . docusate sodium  100 mg Oral BID  . enoxaparin (LOVENOX) injection  40 mg Subcutaneous Daily  . fentaNYL   Intravenous Q4H  .  latanoprost  1 drop Both Eyes QHS  . loratadine  10 mg Oral Daily  . mouth rinse  15 mL Mouth Rinse BID  . metoCLOPramide (REGLAN) injection  10 mg Intravenous Q6H  . mometasone-formoterol  2 puff Inhalation BID  . [START ON 07/17/2019] pneumococcal 23 valent vaccine  0.5 mL Intramuscular Tomorrow-1000  . senna-docusate  1 tablet Oral QHS  . sodium chloride flush  3 mL Intravenous Q12H   Continuous Infusions: .  ceFAZolin (ANCEF) IV    . ceFEPime (MAXIPIME) IV 2 g (07/16/19 0755)  . lactated ringers 10 mL/hr at 07/15/19 1206  . metronidazole 500 mg (07/16/19 0849)  . sodium chloride    . vancomycin 500 mg (07/16/19 1035)   PRN Meds:.acetaminophen **OR** [DISCONTINUED] acetaminophen, diphenhydrAMINE **OR** diphenhydrAMINE, guaiFENesin-dextromethorphan, ipratropium-albuterol, levalbuterol, naloxone **AND** sodium chloride flush, ondansetron **OR** ondansetron (ZOFRAN) IV, oxyCODONE, traMADol  Micro Results Recent Results (from the past 240 hour(s))  Culture, blood (Routine X 2) w Reflex to ID Panel     Status: None (Preliminary result)   Collection Time: 07/13/19  5:30 PM   Specimen: BLOOD RIGHT FOREARM  Result Value Ref Range Status   Specimen Description BLOOD RIGHT  FOREARM  Final   Special Requests   Final    BOTTLES DRAWN AEROBIC AND ANAEROBIC Blood Culture adequate volume   Culture   Final    NO GROWTH 3 DAYS Performed at [facility], [address], [place], [place] [address]    Report Status PENDING  Incomplete  Culture, blood (Routine X 2) w Reflex to ID Panel     Status: None (Preliminary result)   Collection Time: 07/13/19  5:30 PM   Specimen: BLOOD  Result Value Ref Range Status   Specimen Description BLOOD LEFT ANTECUBITAL  Final   Special Requests   Final    BOTTLES DRAWN AEROBIC AND ANAEROBIC Blood Culture results may not be optimal due to an inadequate volume of blood received in culture bottles   Culture   Final    NO GROWTH 3 DAYS Performed at [facility], [address] [place], [place] [address]    Report Status PENDING  Incomplete  SARS Coronavirus 2 by RT PCR (hospital order, performed in [facility] hospital lab) Nasopharyngeal Nasopharyngeal Swab     Status: None   Collection Time: 07/13/19  5:41 PM   Specimen: Nasopharyngeal Swab  Result Value Ref Range Status   SARS Coronavirus 2 NEGATIVE NEGATIVE Final    Comment: (NOTE) SARS-CoV-2 target nucleic acids are NOT DETECTED. The SARS-CoV-2 RNA is generally detectable in upper and lower respiratory specimens during the acute phase of infection. The lowest concentration of SARS-CoV-2 viral copies this assay can detect is 250 copies / mL. A negative result does not preclude SARS-CoV-2 infection and should not be used as the sole basis for treatment or other patient management decisions.  A negative result may occur with improper specimen collection / handling, submission of specimen other than nasopharyngeal swab, presence of viral mutation(s) within the areas targeted by this assay, and inadequate number of viral copies (<250 copies / mL). A negative result must be combined with clinical observations, patient history, and epidemiological information. Fact  Sheet for Patients:   BoilerBrush.com.cy Fact Sheet for Healthcare Providers: https://pope.com/ This test is not yet approved or cleared  by the [place] FDA and has been authorized for detection and/or diagnosis of SARS-CoV-2 by FDA under an Emergency Use Authorization (EUA).  This EUA will remain  in effect (meaning this test can be used) for the duration of the COVID-19 declaration under Section 564(b)(1) of the Act, 21 U.S.C. section 360bbb-3(b)(1), unless the authorization is terminated or revoked sooner. Performed at [facility], [place] [address], [place], [place] [address]   MRSA PCR Screening     Status: None   Collection Time: 07/14/19  7:30 AM   Specimen: Nasal Mucosa; Nasopharyngeal  Result Value Ref Range Status   MRSA by PCR NEGATIVE NEGATIVE Final    Comment:        The GeneXpert MRSA Assay (FDA approved for NASAL specimens only), is one component of a comprehensive MRSA colonization surveillance program. It is not intended to diagnose MRSA infection nor to guide or monitor treatment for MRSA infections. Performed at [facility], [place] [address], [place], [place] [address]   Respiratory Panel by PCR     Status: None   Collection Time: 07/14/19  7:30 AM   Specimen: Nasal Mucosa; Respiratory  Result Value Ref Range Status   Adenovirus NOT DETECTED NOT DETECTED Final   Coronavirus 229E NOT DETECTED NOT DETECTED Final    Comment: (NOTE) The Coronavirus on the Respiratory Panel, DOES NOT test for the novel  Coronavirus (2019 nCoV)    Coronavirus HKU1 NOT DETECTED NOT DETECTED Final   Coronavirus NL63 NOT DETECTED NOT DETECTED Final   Coronavirus OC43 NOT DETECTED NOT DETECTED Final   Metapneumovirus NOT DETECTED NOT DETECTED Final   Rhinovirus / Enterovirus NOT DETECTED NOT DETECTED Final   Influenza A NOT DETECTED NOT DETECTED Final   Influenza B NOT DETECTED NOT DETECTED Final   Parainfluenza  Virus 1 NOT DETECTED NOT DETECTED Final   Parainfluenza Virus 2 NOT DETECTED NOT DETECTED Final   Parainfluenza Virus 3 NOT DETECTED NOT DETECTED Final   Parainfluenza Virus 4 NOT DETECTED NOT DETECTED Final   Respiratory Syncytial Virus NOT DETECTED NOT DETECTED Final   Bordetella pertussis NOT DETECTED NOT DETECTED Final   Chlamydophila pneumoniae NOT DETECTED NOT DETECTED Final   Mycoplasma pneumoniae NOT DETECTED NOT DETECTED Final    Comment: Performed at [facility], [place] [address], [place], [place] [address]  Gram stain     Status: None   Collection Time: 07/14/19  8:46 AM   Specimen: Lung, Left; Pleural Fluid  Result Value Ref Range Status   Specimen Description PLEURAL FLUID  Final   Special Requests LEFT LUNG  Final   Gram Stain   Final    WBC PRESENT,BOTH PMN AND MONONUCLEAR GRAM POSITIVE COCCI CYTOSPIN SMEAR Gram Stain Report Called to,Read Back By and Verified With: Guerry Bruin RN [id number] 07/14/19 A BROWNING Performed at [facility], [place] [address] [place], [place] [address]    Report Status 07/14/2019 FINAL  Final  Culture, body fluid-bottle     Status: Abnormal   Collection Time: 07/14/19  8:46 AM   Specimen: Pleura  Result Value Ref Range Status   Specimen Description PLEURAL FLUID  Final   Special Requests LEFT LUNG  Final   Gram Stain   Final    IN BOTH AEROBIC AND ANAEROBIC BOTTLES GRAM POSITIVE COCCI IN CLUSTERS CRITICAL RESULT CALLED TO, READ BACK BY AND VERIFIED WITH: RN R SANTIAGO [id number] AT 908 AM BY CM    Culture (A)  Final    METHICILLIN RESISTANT STAPHYLOCOCCUS AUREUS CRITICAL RESULT CALLED TO, READ BACK BY AND VERIFIED WITH: RN R SANTIAGO [id number] AR 908 AM BY CM Performed at [facility], [address] [place], [place] [address]    Report Status 07/16/2019 FINAL  Final   Organism ID, Bacteria METHICILLIN RESISTANT STAPHYLOCOCCUS AUREUS  Final      Susceptibility   Methicillin resistant staphylococcus aureus - MIC*    CIPROFLOXACIN  >=8 RESISTANT Resistant     ERYTHROMYCIN >=8 RESISTANT Resistant     GENTAMICIN <=0.5 SENSITIVE Sensitive     OXACILLIN >=4 RESISTANT Resistant     TETRACYCLINE <=1 SENSITIVE Sensitive     VANCOMYCIN 1 SENSITIVE Sensitive     TRIMETH/SULFA <=10 SENSITIVE Sensitive     CLINDAMYCIN <=0.25 SENSITIVE Sensitive     RIFAMPIN <=0.5 SENSITIVE Sensitive     Inducible Clindamycin NEGATIVE Sensitive     * METHICILLIN RESISTANT STAPHYLOCOCCUS AUREUS  Anaerobic culture     Status: None (Preliminary result)   Collection Time: 07/15/19  2:03 PM   Specimen: Pleural, Left; Body Fluid  Result Value Ref Range Status   Specimen Description FLUID LEFT PLEURAL  Final   Special Requests NONE  Final   Gram Stain   Final    ABUNDANT WBC PRESENT,BOTH PMN AND MONONUCLEAR NO ORGANISMS SEEN Performed at [facility], [address], [place], [place] [address]    Culture PENDING  Incomplete   Report Status PENDING  Incomplete  Anaerobic culture     Status: None (Preliminary result)   Collection Time: 07/15/19  2:03 PM   Specimen: Pleural, Left; Body Fluid  Result Value Ref Range Status   Specimen Description FLUID LEFT PLEURAL  Final   Special Requests PEEL  Final   Gram Stain   Final    ABUNDANT WBC PRESENT,BOTH PMN AND MONONUCLEAR RARE GRAM POSITIVE COCCI Performed at [facility], [address] [place], [place] [address]    Culture PENDING  Incomplete   Report Status PENDING  Incomplete  Body fluid culture     Status: None (Preliminary result)   Collection Time: 07/15/19  2:03 PM   Specimen: Pleural, Left; Body Fluid  Result Value Ref Range Status   Specimen Description FLUID LEFT PLEURAL  Final   Special Requests PEEL  Final   Gram Stain   Final    ABUNDANT WBC PRESENT,BOTH PMN AND MONONUCLEAR RARE GRAM POSITIVE COCCI    Culture   Final    FEW STAPHYLOCOCCUS AUREUS SUSCEPTIBILITIES TO FOLLOW Performed at [facility], [address] [place], [place] [address]    Report  Status PENDING  Incomplete  Body fluid culture     Status: None (Preliminary result)   Collection Time: 07/15/19  2:03 PM   Specimen: Fluid  Result Value Ref Range Status   Specimen Description FLUID LEFT PLEURAL  Final   Special Requests NONE  Final   Gram Stain   Final    ABUNDANT WBC PRESENT,BOTH PMN AND MONONUCLEAR NO ORGANISMS SEEN    Culture   Final    NO GROWTH < 24 HOURS Performed at [facility], [address], [place], [place] [address]    Report Status PENDING  Incomplete    Radiology Reports DG Chest 1 View  Result Date: 07/14/2019 CLINICAL DATA:  Status post thoracentesis on the left EXAM: CHEST  1 VIEW COMPARISON:  07/13/2019 FINDINGS: Cardiac shadow is stable. Slight reduction in pleural fluid is noted when compare with the prior exam. No pneumothorax is seen. Slight increased density is noted throughout the left lung which may be related to some re-expansion edema. No  bony abnormality is noted. IMPRESSION: No pneumothorax following thoracentesis. There are however changes suggestive of re-expansion edema. Electronically Signed   By: Alcide Clever M.D.   On: 07/14/2019 08:54   DG Chest 2 View  Result Date: 07/13/2019 CLINICAL DATA:  Chest pain, shortness of breath. EXAM: CHEST - 2 VIEW COMPARISON:  October 21, 2014. FINDINGS: The heart size and mediastinal contours are within normal limits. No pneumothorax is noted. Right lung is clear. Mild left pleural effusion is noted with associated left basilar infiltrate or atelectasis. The visualized skeletal structures are unremarkable. IMPRESSION: Mild left pleural effusion is noted with associated left basilar infiltrate or atelectasis. Electronically Signed   By: Lupita Raider M.D.   On: 07/13/2019 16:47   CT CHEST WO CONTRAST  Result Date: 07/14/2019 CLINICAL DATA:  And pain in a status post thoracentesis EXAM: CT CHEST WITHOUT CONTRAST TECHNIQUE: Multidetector CT imaging of the chest was performed following the  standard protocol without IV contrast. COMPARISON:  07/13/2019 FINDINGS: Cardiovascular: Heart is unremarkable without pericardial effusion. Grossly normal caliber of the thoracic aorta. Evaluation limited without intravenous contrast. Mediastinum/Nodes: Evaluation is limited without IV contrast. The thyroid, trachea, and esophagus are grossly unremarkable. No discrete adenopathy. Lungs/Pleura: Since the prior CT, there is been significant increase in the size of the multilocular complex left pleural effusion. There is progressive consolidation throughout the left lung, with relative sparing of the left apex. No left-sided pneumothorax after thoracentesis. There is a small free-flowing right pleural effusion. Patchy consolidation right lower lobe likely atelectasis. The central airways are patent. Upper Abdomen: No acute abnormality. Musculoskeletal: No acute or destructive bony lesions. Reconstructed images demonstrate no additional findings. IMPRESSION: 1. Enlarging multilocular complex left pleural effusion, compatible with empyema. 2. Progressive left lung consolidation with relative sparing of the left apex. This could reflect underlying pneumonia. 3. Trace free-flowing right pleural effusion, new since prior study. Electronically Signed   By: Sharlet Salina M.D.   On: 07/14/2019 20:08   CT Angio Chest PE W and/or Wo Contrast  Result Date: 07/13/2019 CLINICAL DATA:  Shortness of breath with left-sided chest pain. EXAM: CT ANGIOGRAPHY CHEST WITH CONTRAST TECHNIQUE: Multidetector CT imaging of the chest was performed using the standard protocol during bolus administration of intravenous contrast. Multiplanar CT image reconstructions and MIPs were obtained to evaluate the vascular anatomy. CONTRAST:  75mL OMNIPAQUE IOHEXOL 350 MG/ML SOLN COMPARISON:  None. FINDINGS: Cardiovascular: Heart size normal. Small pericardial effusion evident. No thoracic aortic aneurysm. No filling defects within the opacified  pulmonary arteries to suggest the presence of an acute pulmonary embolus Mediastinum/Nodes: 11 mm short axis AP window lymph node visible on image 44/5 nodular soft tissue is identified in the anterior mediastinum, potentially related to small clustered lymph nodes. No right hilar lymphadenopathy. There is abnormal soft tissue attenuation in the left hilum. There is no axillary lymphadenopathy. Lungs/Pleura: Ground-glass opacity is seen fairly diffusely in the left upper lobe with diffuse consolidation of the lingula with airspace consolidation tracking centrally into the left hilum. There is left lower lobe collapse inferiorly. These changes are associated with a moderate left pleural effusion demonstrating loculation in the apex and anterior upper hemithorax. Nodular pleural thickening or loculated fluid is seen along the medial pleura adjacent to the transverse aorta. No right pleural effusion. Upper Abdomen: Unremarkable. Musculoskeletal: No worrisome lytic or sclerotic osseous abnormality. Review of the MIP images confirms the above findings. IMPRESSION: 1. No CT evidence for acute pulmonary embolus. 2. Diffuse ground-glass opacity in the  left upper lobe with diffuse consolidation of the lingula tracking centrally into the left hilum. This is associated with a moderate left pleural effusion demonstrating loculation and prominent areas of left lower lobe atelectasis. Nodular pleural thickening or loculated fluid is identified along the medial pleura adjacent to the transverse aorta. Mildly enlarged AP window lymph node with question of soft tissue nodularity/clustered lymph nodes in the anterior mediastinum. These changes may all be related to pneumonia, given the apparent pleural nodularity towards the apex, neoplasm is a distinct concern. Close follow-up recommended. Electronically Signed   By: Kennith Center M.D.   On: 07/13/2019 20:44   DG CHEST PORT 1 VIEW  Result Date: 07/16/2019 CLINICAL DATA:  Status  post VATS and decortication on the left on 07/15/2019. EXAM: PORTABLE CHEST 1 VIEW COMPARISON:  Yesterday. FINDINGS: Two left chest tubes remain in place. No pneumothorax. Diffuse airspace opacity in the left lung with mild improvement. Possible minimal lateral and apical pleural fluid or thickening on the left. Interval minimal patchy opacity in the right lower lung zone. Stable mild peribronchial thickening on the right. Grossly normal sized heart. Decreased subcutaneous air on the left. Unremarkable bones. Gas distended bowel in the left upper abdomen. IMPRESSION: 1. No pneumothorax. 2. Diffuse left lung pneumonia and/or pulmonary edema, mildly improved. 3. Interval minimal patchy atelectasis or pneumonia in the right lower lung zone. 4. Decreased subcutaneous air on the left. Electronically Signed   By: Beckie Salts M.D.   On: 07/16/2019 08:51   DG Chest Port 1 View  Result Date: 07/15/2019 CLINICAL DATA:  Post lung surgery follow-up. EXAM: PORTABLE CHEST 1 VIEW COMPARISON:  07/15/2019 FINDINGS: Examination demonstrates adequate lung volumes with 2 left-sided chest tubes present. Mild opacification throughout the left lung with improved aeration compared to the prior exam. Right lung is clear. Cardiomediastinal silhouette is within normal. Mild subcutaneous emphysema over the lower left flank. Remainder the exam is unchanged. IMPRESSION: Hazy opacification over the left lung with interval improved aeration post placement 2 left-sided chest tubes. Electronically Signed   By: Elberta Fortis M.D.   On: 07/15/2019 14:50   DG Chest Port 1 View  Result Date: 07/15/2019 CLINICAL DATA:  Empyema. EXAM: PORTABLE CHEST 1 VIEW COMPARISON:  CT 07/14/2019.  Chest x-ray 07/14/2019. FINDINGS: Complete opacification left hemithorax is noted on today's exam. This is most consistent with reaccumulation of left pleural fluid/progressive empyema. Underlying pulmonary infiltrate cannot be excluded. No pneumothorax. Heart size  cannot be evaluated no acute bony abnormality. IMPRESSION: Complete opacification left hemithorax is noted on today's exam. This is most consistent with reaccumulation of left pleural fluid/progressive empyema. Electronically Signed   By: Maisie Fus  Register   On: 07/15/2019 08:40   ECHOCARDIOGRAM COMPLETE  Result Date: 07/15/2019    ECHOCARDIOGRAM REPORT   Patient Name:   ADAYAH AROCHO Date of Exam: 07/15/2019 Medical Rec #:  [id number]              Height:       57.0 in Accession #:    [id number]             Weight:       95.8 lb Date of Birth:  1960/09/22               BSA:          1.315 m Patient Age:    59 years               BP:  143/90 mmHg Patient Gender: F                      HR:           120 bpm. Exam Location:  Inpatient Procedure: 2D Echo Indications:    Elevated Troponin  History:        Patient has no prior history of Echocardiogram examinations.                 Signs/Symptoms:Shortness of Breath. GERD. Shortness of breath                 with associated chest and shoulder pain, fever. Sepsis secondary                 to pneumonia, with loculated empyema.  Sonographer:    Leta Jungling RDCS Referring Phys: [id number] ANASTASSIA DOUTOVA IMPRESSIONS  1. Left ventricular ejection fraction, by estimation, is 55%. The left ventricle has normal function. The left ventricle has no regional wall motion abnormalities. Left ventricular diastolic parameters are indeterminate.  2. Right ventricular systolic function is normal. The right ventricular size is normal. Tricuspid regurgitation signal is inadequate for assessing PA pressure.  3. The mitral valve is normal in structure. Trivial mitral valve regurgitation. No evidence of mitral stenosis.  4. The aortic valve is normal in structure. Aortic valve regurgitation is not visualized. No aortic stenosis is present.  5. Dilated pulmonary artery.  6. The inferior vena cava is normal in size with greater than 50% respiratory variability, suggesting  right atrial pressure of 3 mmHg. FINDINGS  Left Ventricle: Left ventricular ejection fraction, by estimation, is 55%. The left ventricle has normal function. The left ventricle has no regional wall motion abnormalities. The left ventricular internal cavity size was normal in size. There is no left ventricular hypertrophy. Left ventricular diastolic parameters are indeterminate. Right Ventricle: The right ventricular size is normal. No increase in right ventricular wall thickness. Right ventricular systolic function is normal. Tricuspid regurgitation signal is inadequate for assessing PA pressure. Left Atrium: Left atrial size was normal in size. Right Atrium: Right atrial size was normal in size. Pericardium: There is no evidence of pericardial effusion. Mitral Valve: The mitral valve is normal in structure. Normal mobility of the mitral valve leaflets. Trivial mitral valve regurgitation. No evidence of mitral valve stenosis. Tricuspid Valve: The tricuspid valve is normal in structure. Tricuspid valve regurgitation is not demonstrated. No evidence of tricuspid stenosis. Aortic Valve: The aortic valve is normal in structure. Aortic valve regurgitation is not visualized. No aortic stenosis is present. Pulmonic Valve: The pulmonic valve was normal in structure. Pulmonic valve regurgitation is not visualized. No evidence of pulmonic stenosis. Aorta: The aortic root is normal in size and structure. Pulmonary Artery: The pulmonary artery is dilated. Venous: The inferior vena cava is normal in size with greater than 50% respiratory variability, suggesting right atrial pressure of 3 mmHg. IAS/Shunts: There is redundancy of the interatrial septum. No atrial level shunt detected by color flow Doppler.  LEFT VENTRICLE PLAX 2D LVIDd:         3.70 cm LVIDs:         2.70 cm LV PW:         0.70 cm LV IVS:        0.90 cm LVOT diam:     1.90 cm LV SV:         34 LV SV Index:   26 LVOT Area:  2.84 cm  LV Volumes (MOD) LV vol d,  MOD A2C: 73.8 ml LV vol d, MOD A4C: 71.2 ml LV vol s, MOD A2C: 34.9 ml LV vol s, MOD A4C: 36.1 ml LV SV MOD A2C:     38.9 ml LV SV MOD A4C:     71.2 ml LV SV MOD BP:      37.4 ml RIGHT VENTRICLE RV S prime:     17.70 cm/s TAPSE (M-mode): 1.9 cm LEFT ATRIUM             Index       RIGHT ATRIUM          Index LA diam:        3.00 cm 2.28 cm/m  RA Area:     7.41 cm LA Vol (A2C):   26.7 ml 20.30 ml/m RA Volume:   12.50 ml 9.50 ml/m LA Vol (A4C):   23.3 ml 17.71 ml/m LA Biplane Vol: 27.2 ml 20.68 ml/m  AORTIC VALVE LVOT Vmax:   89.50 cm/s LVOT Vmean:  61.600 cm/s LVOT VTI:    0.120 m  AORTA Ao Root diam: 2.90 cm Ao Asc diam:  2.70 cm  SHUNTS Systemic VTI:  0.12 m Systemic Diam: 1.90 cm Weston BrassGayatri Acharya MD Electronically signed by Weston BrassGayatri Acharya MD Signature Date/Time: 07/15/2019/12:54:26 PM    Final    VAS US LOWER EXTREMITY VENOUS (DVT)  Result Date: 07/15/2019  Lower Venous DVTStudy Indications: Elevated ddimer.  Comparison Study: no prior Performing Technologist: Blanch MediaMegan Riddle RVS  Examination Guidelines: A complete evaluation includes B-mode imaging, spectral Doppler, color Doppler, and power Doppler as needed of all accessible portions of each vessel. Bilateral testing is considered an integral part of a complete examination. Limited examinations for reoccurring indications may be performed as noted. The reflux portion of the exam is performed with the patient in reverse Trendelenburg.  +---------+---------------+---------+-----------+----------+--------------+ RIGHT    CompressibilityPhasicitySpontaneityPropertiesThrombus Aging +---------+---------------+---------+-----------+----------+--------------+ CFV      Full           Yes      Yes                                 +---------+---------------+---------+-----------+----------+--------------+ SFJ      Full                                                        +---------+---------------+---------+-----------+----------+--------------+  FV Prox  Full                                                        +---------+---------------+---------+-----------+----------+--------------+ FV Mid   Full                                                        +---------+---------------+---------+-----------+----------+--------------+ FV DistalFull                                                        +---------+---------------+---------+-----------+----------+--------------+  PFV      Full                                                        +---------+---------------+---------+-----------+----------+--------------+ POP      Full           Yes      Yes                                 +---------+---------------+---------+-----------+----------+--------------+ PTV      Full                                                        +---------+---------------+---------+-----------+----------+--------------+ PERO     Full                                                        +---------+---------------+---------+-----------+----------+--------------+   +---------+---------------+---------+-----------+----------+--------------+ LEFT     CompressibilityPhasicitySpontaneityPropertiesThrombus Aging +---------+---------------+---------+-----------+----------+--------------+ CFV      Full           Yes      Yes                                 +---------+---------------+---------+-----------+----------+--------------+ SFJ      Full                                                        +---------+---------------+---------+-----------+----------+--------------+ FV Prox  Full                                                        +---------+---------------+---------+-----------+----------+--------------+ FV Mid   Full                                                        +---------+---------------+---------+-----------+----------+--------------+ FV DistalFull                                                         +---------+---------------+---------+-----------+----------+--------------+ PFV      Full                                                        +---------+---------------+---------+-----------+----------+--------------+   POP      Full           Yes      Yes                                 +---------+---------------+---------+-----------+----------+--------------+ PTV      Full                                                        +---------+---------------+---------+-----------+----------+--------------+ PERO     Full                                                        +---------+---------------+---------+-----------+----------+--------------+     Summary: BILATERAL: - No evidence of deep vein thrombosis seen in the lower extremities, bilaterally. - RIGHT: - A cystic structure is found in the popliteal fossa.  LEFT: - No cystic structure found in the popliteal fossa.  *See table(s) above for measurements and observations. Electronically signed by Sherald Hess MD on 07/15/2019 at 5:26:31 PM.    Final    IR THORACENTESIS ASP PLEURAL SPACE W/IMG GUIDE  Result Date: 07/14/2019 INDICATION: Patient with history of recurrent pneumonia, presents with shortness of breath and left pleural effusion. Request is made for diagnostic and therapeutic left thoracentesis. EXAM: ULTRASOUND GUIDED DIAGNOSTIC AND THERAPEUTIC LEFT THORACENTESIS MEDICATIONS: 10 mL 1% lidocaine COMPLICATIONS: SIR Level A - No therapy, no consequence. PROCEDURE: An ultrasound guided thoracentesis was thoroughly discussed with the patient and questions answered. The benefits, risks, alternatives and complications were also discussed. The patient understands and wishes to proceed with the procedure. Written consent was obtained. Ultrasound was performed to localize and mark an adequate pocket of fluid in the left chest. The area was then prepped and draped in the normal sterile fashion. 1%  Lidocaine was used for local anesthesia. Under ultrasound guidance a 6 Fr Safe-T-Centesis catheter was introduced. Thoracentesis was performed. The catheter was removed and a dressing applied. FINDINGS: A total of approximately 250 mL of cloudy, purulent-appearing fluid was removed. Samples were sent to the laboratory as requested by the clinical team. Postprocedure chest x-ray shows show suggestion of re-expansion edema. IMPRESSION: Successful ultrasound guided diagnostic and therapeutic thoracentesis yielding 250 mL of pleural fluid. Read by: Loyce Dys PA-C Electronically Signed   By: Gilmer Mor D.O.   On: 07/14/2019 11:36     Huey Bienenstock M.D on 07/16/2019 at 10:54 AM  Between 7am to 7pm - Pager - [phone]  After 7pm go to [email or web address] - password [facility]  [facility] -  Office  [phone]

## 2019-07-16 NOTE — Progress Notes (Signed)
Pharmacy Antibiotic Note  Sally Buck is a 59 y.o. female admitted on 07/13/2019 with PNA and fevers.  Continues on Vancomycin and Cefepime   S/p VATs - > pleural fluid culture growing MRSA Scr stable, afebrile   Plan: Continue Vancomycin 500 mg iv Q 12 hours Consider stopping Cefepime and Metronidazole?  Height: 4\' 9"  (144.8 cm) Weight: 43.5 kg (95 lb 12.8 oz) IBW/kg (Calculated) : 38.6  Temp (24hrs), Avg:97.9 F (36.6 C), Min:97.5 F (36.4 C), Max:98.6 F (37 C)  Recent Labs  Lab 07/13/19 1608 07/13/19 1730 07/14/19 0517 07/15/19 0426 07/15/19 1730 07/16/19 0326  WBC 8.6  --  16.8* 32.4* 37.6* 35.4*  CREATININE 0.68  --  0.65 0.72 0.64 0.51  LATICACIDVEN  --  1.2  --   --   --   --     Estimated Creatinine Clearance: 45 mL/min (by C-G formula based on SCr of 0.51 mg/dL).    Allergies  Allergen Reactions  . Ampicillin Itching  . Ceclor [Cefaclor] Other (See Comments)    Passed out   . Other Other (See Comments)    travoprost : unknown  . Sulfa Antibiotics   . Tape Itching  . Tetracyclines & Related Itching  . Tylenol With Codeine #3 [Acetaminophen-Codeine] Other (See Comments)    Passed out    Thank you 07/18/19, PharmD [phone]  07/16/2019 8:41 AM

## 2019-07-16 NOTE — Progress Notes (Addendum)
      [address]       [place] [address]             [phone]      1 Day Post-Op Procedure(s) (LRB): VIDEO ASSISTED THORACOSCOPY (VATS)/DECORTICATION and Drainage of Empyema (Left)   Subjective:  Patient doing okay.  Pain is controlled  Objective: Vital signs in last 24 hours: Temp:  [97.5 F (36.4 C)-98.6 F (37 C)] 97.6 F (36.4 C) (05/21 0700) Pulse Rate:  [96-132] 107 (05/21 0700) Cardiac Rhythm: Sinus tachycardia (05/21 0706) Resp:  [16-29] 26 (05/21 0700) BP: (85-137)/(62-85) 114/71 (05/21 0434) SpO2:  [90 %-100 %] 96 % (05/21 0804) Arterial Line BP: (97-159)/(45-72) 159/72 (05/21 0700)  Intake/Output from previous day: 05/20 0701 - 05/21 0700 In: 5982.8 [P.O.:1440; I.V.:3942.7; IV Piggyback:600.1] Out: 905 [Urine:750; Chest Tube:155]  General appearance: alert, cooperative and no distress Heart: regular rate and rhythm Lungs: diminished breath sounds left lung Abdomen: soft, non-tender; bowel sounds normal; no masses,  no organomegaly Extremities: extremities normal, atraumatic, no cyanosis or edema Wound: clean and dry  Lab Results: Recent Labs    07/15/19 1730 07/16/19 0326  WBC 37.6* 35.4*  HGB 11.7* 11.3*  HCT 35.0* 33.5*  PLT 399 367   BMET:  Recent Labs    07/15/19 1730 07/16/19 0326  NA 133* 131*  K 4.1 4.6  CL 100 100  CO2 24 24  GLUCOSE 154* 200*  BUN 10 10  CREATININE 0.64 0.51  CALCIUM 8.1* 8.2*    PT/INR:  Recent Labs    07/15/19 1730  LABPROT 15.8*  INR 1.3*   ABG    Component Value Date/Time   PHART 7.350 07/15/2019 1645   HCO3 24.0 07/15/2019 1645   ACIDBASEDEF 0.9 07/15/2019 1645   O2SAT 87.4 07/15/2019 1645   CBG (last 3)  No results for input(s): GLUCAP in the last 72 hours.  Assessment/Plan: S/P Procedure(s) (LRB): VIDEO ASSISTED THORACOSCOPY (VATS)/DECORTICATION and Drainage of Empyema (Left)  1. Pulm- Chest tube in place, on suction.. CXR with some improvement of pleural fluid....  155 cc output yesterday 2. ID- empyema, OR cultures are pending, preop fluid culture shows MRSA.Marland Kitchen on ABX per primary 3. Pain control- continue PCA, oral medications 4. Dispo- patient stable, chest tubes to remain in place on suction today, d/c arterial line, d/c IV fluids, continue care per primary   LOS: 3 days    Lowella Dandy, PA-C 07/16/2019

## 2019-07-17 ENCOUNTER — Inpatient Hospital Stay (HOSPITAL_COMMUNITY)

## 2019-07-17 DIAGNOSIS — E871 Hypo-osmolality and hyponatremia: Secondary | ICD-10-CM | POA: Diagnosis not present

## 2019-07-17 DIAGNOSIS — J869 Pyothorax without fistula: Secondary | ICD-10-CM | POA: Diagnosis not present

## 2019-07-17 LAB — BODY FLUID CULTURE

## 2019-07-17 LAB — COMPREHENSIVE METABOLIC PANEL WITH GFR
ALT: 33 U/L (ref 0–44)
AST: 34 U/L (ref 15–41)
Albumin: 1.8 g/dL — ABNORMAL LOW (ref 3.5–5.0)
Alkaline Phosphatase: 194 U/L — ABNORMAL HIGH (ref 38–126)
Anion gap: 12 (ref 5–15)
BUN: 12 mg/dL (ref 8–23)
CO2: 22 mmol/L (ref 22–32)
Calcium: 8.4 mg/dL — ABNORMAL LOW (ref 8.9–10.3)
Chloride: 100 mmol/L (ref 98–111)
Creatinine, Ser: 0.59 mg/dL (ref 0.44–1.00)
GFR calc Af Amer: >60 mL/min
GFR calc non Af Amer: >60 mL/min
Glucose, Bld: 68 mg/dL — ABNORMAL LOW (ref 70–99)
Potassium: 4.3 mmol/L (ref 3.5–5.1)
Sodium: 134 mmol/L — ABNORMAL LOW (ref 135–145)
Total Bilirubin: 1.4 mg/dL — ABNORMAL HIGH (ref 0.3–1.2)
Total Protein: 5.6 g/dL — ABNORMAL LOW (ref 6.5–8.1)

## 2019-07-17 LAB — CBC
HCT: 38.1 % (ref 36.0–46.0)
Hemoglobin: 12.5 g/dL (ref 12.0–15.0)
MCH: 29.8 pg (ref 26.0–34.0)
MCHC: 32.8 g/dL (ref 30.0–36.0)
MCV: 90.7 fL (ref 80.0–100.0)
Platelets: 423 K/uL — ABNORMAL HIGH (ref 150–400)
RBC: 4.2 MIL/uL (ref 3.87–5.11)
RDW: 12 % (ref 11.5–15.5)
WBC: 25.7 K/uL — ABNORMAL HIGH (ref 4.0–10.5)
nRBC: 0 % (ref 0.0–0.2)

## 2019-07-17 LAB — PROCALCITONIN: Procalcitonin: 4.49 ng/mL

## 2019-07-17 LAB — GLUCOSE, CAPILLARY: Glucose-Capillary: 88 mg/dL (ref 70–99)

## 2019-07-17 MED ORDER — POLYETHYLENE GLYCOL 3350 17 G PO PACK
17.0000 g | PACK | Freq: Two times a day (BID) | ORAL | Status: AC
  Administered 2019-07-17 – 2019-07-18 (×3): 17 g via ORAL
  Filled 2019-07-17 (×5): qty 1

## 2019-07-17 NOTE — Progress Notes (Signed)
      [address]       [place] [address]             [phone]      2 Days Post-Op Procedure(s) (LRB): VIDEO ASSISTED THORACOSCOPY (VATS)/DECORTICATION and Drainage of Empyema (Left) Subjective: Having some pain associated with the chest tubes bu it is controlled with the PCA.  Also continues to have cough productive of thin, white purulent sputum.  Objective: Vital signs in last 24 hours: Temp:  [97.7 F (36.5 C)-98.5 F (36.9 C)] 98 F (36.7 C) (05/22 0830) Pulse Rate:  [102-115] 108 (05/22 0830) Cardiac Rhythm: Sinus tachycardia (05/22 0825) Resp:  [15-31] 31 (05/22 0830) BP: (100-140)/(63-91) 100/63 (05/22 0830) SpO2:  [91 %-100 %] 96 % (05/22 0847) Arterial Line BP: (144)/(75) 144/75 (05/21 1000) FiO2 (%):  [28 %] 28 % (05/22 0847)    Intake/Output from previous day: 05/21 0701 - 05/22 0700 In: 1420 [P.O.:120; I.V.:300; IV Piggyback:1000] Out: 110 [Urine:100; Chest Tube:10] Intake/Output this shift: No intake/output data recorded.  General appearance: alert, cooperative and mild distress Neurologic: intact Heart: S. tach. Lungs: Coarse crackles on left, right side CTA. CXR shows persistent diffuse density over the left lung. No PTX.  Minimal CT drainage past 24 hours. No air leak.  Wound: Left chest dressings are dry.   Lab Results: Recent Labs    07/16/19 0326 07/17/19 0343  WBC 35.4* 25.7*  HGB 11.3* 12.5  HCT 33.5* 38.1  PLT 367 423*   BMET:  Recent Labs    07/16/19 0326 07/17/19 0343  NA 131* 134*  K 4.6 4.3  CL 100 100  CO2 24 22  GLUCOSE 200* 68*  BUN 10 12  CREATININE 0.51 0.59  CALCIUM 8.2* 8.4*    PT/INR:  Recent Labs    07/15/19 1730  LABPROT 15.8*  INR 1.3*   ABG    Component Value Date/Time   PHART 7.350 07/15/2019 1645   HCO3 24.0 07/15/2019 1645   ACIDBASEDEF 0.9 07/15/2019 1645   O2SAT 87.4 07/15/2019 1645   CBG (last 3)  No results for input(s): GLUCAP in the last 72  hours.  Assessment/Plan: S/P Procedure(s) (LRB): VIDEO ASSISTED THORACOSCOPY (VATS)/DECORTICATION and Drainage of Empyema (Left)  -POD2 left VATS, drainage of complex empyema. Cx. Showing few MRSA. ABX mgt per primary team. WBC gradually improving, she is afebrile. Will discuss CT removal with Dr. Vickey Sages since these have drained only 57ml past 24 hours.  Continue PCA 1 more day and transition to oral medications over the next 24 hours.    LOS: 4 days    Leary Roca , [place] [phone] 07/17/2019

## 2019-07-17 NOTE — Progress Notes (Signed)
PROGRESS NOTE                                                                                                                                                                                                             Patient Demographics:    Sally Buck, is a 59 y.o. female, DOB - November 19, 1960, ZSW:[id number]  Admit date - 07/13/2019   Admitting Physician Therisa Doyne, MD  Outpatient Primary MD for the patient is Marva Panda, NP  LOS - 4   Chief Complaint  Patient presents with  . Allergic Reaction       Brief Narrative    59 yo female with past medical history of recurrent pneumonia, GERD, and Hyperlipidemia who presented with a chief complaint of shortness of breath with associated chest and shoulder pain, progressive shortness of breath, fever and chills, patient reports she has had multiple episodes of pneumonia in the past ,On admission patient was seen febrile, tachypneic, tachycardic,Chest CT was then obtained that revealed significant for loculated pleural effusion vs empyema. IR was consulted and preformed US guided left thoracentesis that yielded of cloudy thick purulent-appearing fluid. Post thoracentesis patient continues to remain febrile with worsening leukocytosis, increased work of breathing, PCCM were consulted, recommendation for CT surgery evaluation and surgery.    Subjective:    Sally Buck today reports she is feeling better, reports some pain at the CT insertion site, but reports overall it is controlled.   Assessment  & Plan :    Active Problems:   CAP (community acquired pneumonia)   Empyema (HCC)   Tachycardia   Sepsis (HCC)   Dehydration   Hyponatremia  Acute hypoxic resp failute and Sepsis secondary to pneumonia, with loculated empyema. -Sepsis present on admission, leukocytosis, tachypneic, tachycardic and hypoxia with elevated procalcitonin. -Blood cultures no growth to date,  postthoracentesis to 50 cc of cloudy thick purulent drainage, wit pleural effusion growing MRSA initially status post thoracentesis , -Patient with worsening sepsis, leukocytosis and increased work of breathing secondary to her to loculated empyema, went for VATS, and empyema evacuation by CT surgery 5/20 . -Currently on broad-spectrum antibiotic vancomycin, cefepime and Flagyl, initially up for effusion growing MRSA, so we will narrow her regimen to IV vancomycin currently. -Management of chest tube, and pain management per CT surgery.   COVID-19 Labs  No results for  input(s): DDIMER, FERRITIN, LDH, CRP in the last 72 hours.  Lab Results  Component Value Date   SARSCOV2NAA NEGATIVE 07/13/2019   Elevated D-dimers -Is most likely in the setting of severe chest infection, CTA chest negative for PE, venous Dopplers negative for DVT .  Hyponatremia -Mild, symptomatic, continue to monitor  Hypomagnesemia -Repleted  History of asthma -No active wheezing   Code Status : Full  Family Communication  : None at bedside.  Disposition Plan  :  Status is: Inpatient  Remains inpatient appropriate because:Hemodynamically unstable   Dispo: The patient is from: Home              Anticipated d/c is to: Home              Anticipated d/c date is: > 3 days              Patient currently is not medically stable to d/c.      Consults  :  PCCM , CT surgery  Procedures  :  -Thoracentesis by IR. -  VIDEO ASSISTED THORACOSCOPY (VATS)/DECORTICATION and Drainage of Empyema (Left)  5/20 by Dr. Vickey Sages  DVT Prophylaxis  : Holcomb Lovenox  Lab Results  Component Value Date   PLT 423 (H) 07/17/2019    Antibiotics  :    Anti-infectives (From admission, onward)   Start     Dose/Rate Route Frequency Ordered Stop   07/15/19 2200  vancomycin (VANCOREADY) IVPB 500 mg/100 mL     500 mg 100 mL/hr over 60 Minutes Intravenous Every 12 hours 07/15/19 0723     07/15/19 1623  ceFAZolin (ANCEF) IVPB  2g/100 mL premix     2 g 200 mL/hr over 30 Minutes Intravenous 30 min pre-op 07/15/19 1623     07/15/19 0800  metroNIDAZOLE (FLAGYL) IVPB 500 mg  Status:  Discontinued     500 mg 100 mL/hr over 60 Minutes Intravenous Every 8 hours 07/15/19 0711 07/17/19 1102   07/15/19 0800  vancomycin (VANCOCIN) IVPB 1000 mg/200 mL premix     1,000 mg 200 mL/hr over 60 Minutes Intravenous  Once 07/15/19 0723 07/15/19 1124   07/15/19 0800  ceFEPIme (MAXIPIME) 2 g in sodium chloride 0.9 % 100 mL IVPB  Status:  Discontinued     2 g 200 mL/hr over 30 Minutes Intravenous Every 12 hours 07/15/19 0723 07/17/19 1102   07/14/19 2200  cefTRIAXone (ROCEPHIN) 2 g in sodium chloride 0.9 % 100 mL IVPB  Status:  Discontinued     2 g 200 mL/hr over 30 Minutes Intravenous Every 24 hours 07/13/19 2352 07/15/19 0711   07/14/19 2000  azithromycin (ZITHROMAX) 500 mg in sodium chloride 0.9 % 250 mL IVPB  Status:  Discontinued     500 mg 250 mL/hr over 60 Minutes Intravenous Every 24 hours 07/13/19 2352 07/14/19 1559   07/14/19 0000  cefTRIAXone (ROCEPHIN) 2 g in sodium chloride 0.9 % 100 mL IVPB     2 g 200 mL/hr over 30 Minutes Intravenous  Once 07/13/19 2357 07/14/19 0235   07/13/19 1715  levofloxacin (LEVAQUIN) IVPB 750 mg     750 mg 100 mL/hr over 90 Minutes Intravenous  Once 07/13/19 1706 07/13/19 1937        Objective:   Vitals:   07/17/19 0900 07/17/19 1000 07/17/19 1100 07/17/19 1200  BP: 136/78 130/74 116/72 118/73  Pulse: (!) 114 (!) 113  (!) 110  Resp: (!) 27 (!) 30 (!) 29 (!) 25  Temp:  97.8 F (36.6 C)  TempSrc:    Oral  SpO2: 95% 98%  96%  Weight:      Height:        Wt Readings from Last 3 Encounters:  07/13/19 43.5 kg  07/11/15 42 kg  10/21/14 42.2 kg     Intake/Output Summary (Last 24 hours) at 07/17/2019 1453 Last data filed at 07/17/2019 1109 Gross per 24 hour  Intake 743 ml  Output 110 ml  Net 633 ml     Physical Exam  Awake Alert, Oriented X 3, in bed, in mild discomfort  candidate to pain at CT insertion site,  symmetrical Chest wall movement, the left lung with scattered rhonchi RRR,No Gallops,Rubs or new Murmurs, No Parasternal Heave +ve B.Sounds, Abd Soft, No tenderness, No rebound - guarding or rigidity. No Cyanosis, Clubbing or edema, No new Rash or bruise       Data Review:    CBC Recent Labs  Lab 07/14/19 0517 07/15/19 0426 07/15/19 1730 07/16/19 0326 07/17/19 0343  WBC 16.8* 32.4* 37.6* 35.4* 25.7*  HGB 12.0 12.6 11.7* 11.3* 12.5  HCT 36.0 37.3 35.0* 33.5* 38.1  PLT 390 415* 399 367 423*  MCV 89.6 90.5 90.2 90.1 90.7  MCH 29.9 30.6 30.2 30.4 29.8  MCHC 33.3 33.8 33.4 33.7 32.8  RDW 11.6 11.9 12.0 12.0 12.0  LYMPHSABS 0.3*  --   --   --   --   MONOABS 0.9  --   --   --   --   EOSABS 0.0  --   --   --   --   BASOSABS 0.1  --   --   --   --     Chemistries  Recent Labs  Lab 07/14/19 0517 07/15/19 0426 07/15/19 1730 07/16/19 0326 07/17/19 0343  NA 131* 132* 133* 131* 134*  K 4.0 3.7 4.1 4.6 4.3  CL 98 99 100 100 100  CO2 23 21* 24 24 22   GLUCOSE 126* 123* 154* 200* 68*  BUN 7* 9 10 10 12   CREATININE 0.65 0.72 0.64 0.51 0.59  CALCIUM 8.5* 8.4* 8.1* 8.2* 8.4*  MG 1.5* 2.1  --   --   --   AST 34 23 29  --  34  ALT 54* 36 34  --  33  ALKPHOS 140* 144* 135*  --  194*  BILITOT 1.6* 1.3* 0.9  --  1.4*   ------------------------------------------------------------------------------------------------------------------ No results for input(s): CHOL, HDL, LDLCALC, TRIG, CHOLHDL, LDLDIRECT in the last 72 hours.  No results found for: HGBA1C ------------------------------------------------------------------------------------------------------------------ No results for input(s): TSH, T4TOTAL, T3FREE, THYROIDAB in the last 72 hours.  Invalid input(s): FREET3 ------------------------------------------------------------------------------------------------------------------ No results for input(s): VITAMINB12, FOLATE, FERRITIN,  TIBC, IRON, RETICCTPCT in the last 72 hours.  Coagulation profile Recent Labs  Lab 07/15/19 0426 07/15/19 1730  INR 1.3* 1.3*    No results for input(s): DDIMER in the last 72 hours.  Cardiac Enzymes No results for input(s): CKMB, TROPONINI, MYOGLOBIN in the last 168 hours.  Invalid input(s): CK ------------------------------------------------------------------------------------------------------------------ No results found for: BNP  Inpatient Medications  Scheduled Meds: . acetaminophen  1,000 mg Oral Q6H   Or  . acetaminophen (TYLENOL) oral liquid 160 mg/5 mL  1,000 mg Oral Q6H  . bisacodyl  10 mg Oral Daily  . bupivacaine liposome  20 mL Infiltration Once  . docusate sodium  100 mg Oral BID  . enoxaparin (LOVENOX) injection  40 mg Subcutaneous Daily  . fentaNYL   Intravenous Q4H  .  latanoprost  1 drop Both Eyes QHS  . loratadine  10 mg Oral Daily  . mouth rinse  15 mL Mouth Rinse BID  . mometasone-formoterol  2 puff Inhalation BID  . pneumococcal 23 valent vaccine  0.5 mL Intramuscular Tomorrow-1000  . polyethylene glycol  17 g Oral BID  . senna-docusate  1 tablet Oral QHS  . sodium chloride flush  3 mL Intravenous Q12H   Continuous Infusions: .  ceFAZolin (ANCEF) IV    . lactated ringers 10 mL/hr at 07/15/19 1206  . sodium chloride    . vancomycin 500 mg (07/17/19 1227)   PRN Meds:.acetaminophen **OR** [DISCONTINUED] acetaminophen, diphenhydrAMINE **OR** diphenhydrAMINE, guaiFENesin-dextromethorphan, ipratropium-albuterol, levalbuterol, naloxone **AND** sodium chloride flush, ondansetron **OR** ondansetron (ZOFRAN) IV, oxyCODONE, traMADol  Micro Results Recent Results (from the past 240 hour(s))  Culture, blood (Routine X 2) w Reflex to ID Panel     Status: None (Preliminary result)   Collection Time: 07/13/19  5:30 PM   Specimen: BLOOD RIGHT FOREARM  Result Value Ref Range Status   Specimen Description BLOOD RIGHT FOREARM  Final   Special Requests   Final      BOTTLES DRAWN AEROBIC AND ANAEROBIC Blood Culture adequate volume   Culture   Final    NO GROWTH 4 DAYS Performed at [facility], [address] [place], [place] [address]    Report Status PENDING  Incomplete  Culture, blood (Routine X 2) w Reflex to ID Panel     Status: None (Preliminary result)   Collection Time: 07/13/19  5:30 PM   Specimen: BLOOD  Result Value Ref Range Status   Specimen Description BLOOD LEFT ANTECUBITAL  Final   Special Requests   Final    BOTTLES DRAWN AEROBIC AND ANAEROBIC Blood Culture results may not be optimal due to an inadequate volume of blood received in culture bottles   Culture   Final    NO GROWTH 4 DAYS Performed at [facility], [address] [place], [place] [address]    Report Status PENDING  Incomplete  SARS Coronavirus 2 by RT PCR (hospital order, performed in [facility] hospital lab) Nasopharyngeal Nasopharyngeal Swab     Status: None   Collection Time: 07/13/19  5:41 PM   Specimen: Nasopharyngeal Swab  Result Value Ref Range Status   SARS Coronavirus 2 NEGATIVE NEGATIVE Final    Comment: (NOTE) SARS-CoV-2 target nucleic acids are NOT DETECTED. The SARS-CoV-2 RNA is generally detectable in upper and lower respiratory specimens during the acute phase of infection. The lowest concentration of SARS-CoV-2 viral copies this assay can detect is 250 copies / mL. A negative result does not preclude SARS-CoV-2 infection and should not be used as the sole basis for treatment or other patient management decisions.  A negative result may occur with improper specimen collection / handling, submission of specimen other than nasopharyngeal swab, presence of viral mutation(s) within the areas targeted by this assay, and inadequate number of viral copies (<250 copies / mL). A negative result must be combined with clinical observations, patient history, and epidemiological information. Fact Sheet for Patients:    BoilerBrush.com.cy Fact Sheet for Healthcare Providers: https://pope.com/ This test is not yet approved or cleared  by the [place] FDA and has been authorized for detection and/or diagnosis of SARS-CoV-2 by FDA under an Emergency Use Authorization (EUA).  This EUA will remain in effect (meaning this test can be used) for the duration of the COVID-19 declaration under Section 564(b)(1) of  the Act, 21 U.S.C. section 360bbb-3(b)(1), unless the authorization is terminated or revoked sooner. Performed at [facility], [address] [place], [place] [address]   MRSA PCR Screening     Status: None   Collection Time: 07/14/19  7:30 AM   Specimen: Nasal Mucosa; Nasopharyngeal  Result Value Ref Range Status   MRSA by PCR NEGATIVE NEGATIVE Final    Comment:        The GeneXpert MRSA Assay (FDA approved for NASAL specimens only), is one component of a comprehensive MRSA colonization surveillance program. It is not intended to diagnose MRSA infection nor to guide or monitor treatment for MRSA infections. Performed at [facility], [address] [place], [place] [address]   Respiratory Panel by PCR     Status: None   Collection Time: 07/14/19  7:30 AM   Specimen: Nasal Mucosa; Respiratory  Result Value Ref Range Status   Adenovirus NOT DETECTED NOT DETECTED Final   Coronavirus 229E NOT DETECTED NOT DETECTED Final    Comment: (NOTE) The Coronavirus on the Respiratory Panel, DOES NOT test for the novel  Coronavirus (2019 nCoV)    Coronavirus HKU1 NOT DETECTED NOT DETECTED Final   Coronavirus NL63 NOT DETECTED NOT DETECTED Final   Coronavirus OC43 NOT DETECTED NOT DETECTED Final   Metapneumovirus NOT DETECTED NOT DETECTED Final   Rhinovirus / Enterovirus NOT DETECTED NOT DETECTED Final   Influenza A NOT DETECTED NOT DETECTED Final   Influenza B NOT DETECTED NOT DETECTED Final   Parainfluenza Virus 1 NOT DETECTED NOT  DETECTED Final   Parainfluenza Virus 2 NOT DETECTED NOT DETECTED Final   Parainfluenza Virus 3 NOT DETECTED NOT DETECTED Final   Parainfluenza Virus 4 NOT DETECTED NOT DETECTED Final   Respiratory Syncytial Virus NOT DETECTED NOT DETECTED Final   Bordetella pertussis NOT DETECTED NOT DETECTED Final   Chlamydophila pneumoniae NOT DETECTED NOT DETECTED Final   Mycoplasma pneumoniae NOT DETECTED NOT DETECTED Final    Comment: Performed at [facility], [address] [place], [place] [address]  Gram stain     Status: None   Collection Time: 07/14/19  8:46 AM   Specimen: Lung, Left; Pleural Fluid  Result Value Ref Range Status   Specimen Description PLEURAL FLUID  Final   Special Requests LEFT LUNG  Final   Gram Stain   Final    WBC PRESENT,BOTH PMN AND MONONUCLEAR GRAM POSITIVE COCCI CYTOSPIN SMEAR Gram Stain Report Called to,Read Back By and Verified With: Elfredia Nevins JOHNSTON RN 1240 07/14/19 A BROWNING Performed at [facility], [address] [place], [place] [address]    Report Status 07/14/2019 FINAL  Final  Culture, body fluid-bottle     Status: Abnormal   Collection Time: 07/14/19  8:46 AM   Specimen: Pleura  Result Value Ref Range Status   Specimen Description PLEURAL FLUID  Final   Special Requests LEFT LUNG  Final   Gram Stain   Final    IN BOTH AEROBIC AND ANAEROBIC BOTTLES GRAM POSITIVE COCCI IN CLUSTERS CRITICAL RESULT CALLED TO, READ BACK BY AND VERIFIED WITH: RN R SANTIAGO [id number] AT 908 AM BY CM    Culture (A)  Final    METHICILLIN RESISTANT STAPHYLOCOCCUS AUREUS CRITICAL RESULT CALLED TO, READ BACK BY AND VERIFIED WITH: RN R SANTIAGO [phone] AR 908 AM BY CM Performed at [facility], [address] [place], [place] [address]    Report Status 07/16/2019 FINAL  Final  Organism ID, Bacteria METHICILLIN RESISTANT STAPHYLOCOCCUS AUREUS  Final      Susceptibility   Methicillin resistant staphylococcus aureus - MIC*    CIPROFLOXACIN >=8 RESISTANT Resistant       ERYTHROMYCIN >=8 RESISTANT Resistant     GENTAMICIN <=0.5 SENSITIVE Sensitive     OXACILLIN >=4 RESISTANT Resistant     TETRACYCLINE <=1 SENSITIVE Sensitive     VANCOMYCIN 1 SENSITIVE Sensitive     TRIMETH/SULFA <=10 SENSITIVE Sensitive     CLINDAMYCIN <=0.25 SENSITIVE Sensitive     RIFAMPIN <=0.5 SENSITIVE Sensitive     Inducible Clindamycin NEGATIVE Sensitive     * METHICILLIN RESISTANT STAPHYLOCOCCUS AUREUS  Anaerobic culture     Status: None (Preliminary result)   Collection Time: 07/15/19  2:03 PM   Specimen: Pleural, Left; Body Fluid  Result Value Ref Range Status   Specimen Description FLUID LEFT PLEURAL  Final   Special Requests NONE  Final   Gram Stain   Final    ABUNDANT WBC PRESENT,BOTH PMN AND MONONUCLEAR NO ORGANISMS SEEN Performed at [facility], [address], [place], [place] [address]    Culture   Final    NO ANAEROBES ISOLATED; CULTURE IN PROGRESS FOR 5 DAYS   Report Status PENDING  Incomplete  Anaerobic culture     Status: None (Preliminary result)   Collection Time: 07/15/19  2:03 PM   Specimen: Pleural, Left; Body Fluid  Result Value Ref Range Status   Specimen Description FLUID LEFT PLEURAL  Final   Special Requests PEEL  Final   Gram Stain   Final    ABUNDANT WBC PRESENT,BOTH PMN AND MONONUCLEAR RARE GRAM POSITIVE COCCI Performed at [facility], [address], [place], [place] [address]    Culture   Final    NO ANAEROBES ISOLATED; CULTURE IN PROGRESS FOR 5 DAYS   Report Status PENDING  Incomplete  Body fluid culture     Status: None   Collection Time: 07/15/19  2:03 PM   Specimen: Pleural, Left; Body Fluid  Result Value Ref Range Status   Specimen Description FLUID LEFT PLEURAL  Final   Special Requests PEEL  Final   Gram Stain   Final    ABUNDANT WBC PRESENT,BOTH PMN AND MONONUCLEAR RARE GRAM POSITIVE COCCI Performed at [facility], [address] [place], [place] [address]    Culture FEW METHICILLIN RESISTANT  STAPHYLOCOCCUS AUREUS  Final   Report Status 07/17/2019 FINAL  Final   Organism ID, Bacteria METHICILLIN RESISTANT STAPHYLOCOCCUS AUREUS  Final      Susceptibility   Methicillin resistant staphylococcus aureus - MIC*    CIPROFLOXACIN >=8 RESISTANT Resistant     ERYTHROMYCIN >=8 RESISTANT Resistant     GENTAMICIN <=0.5 SENSITIVE Sensitive     OXACILLIN >=4 RESISTANT Resistant     TETRACYCLINE <=1 SENSITIVE Sensitive     VANCOMYCIN 1 SENSITIVE Sensitive     TRIMETH/SULFA <=10 SENSITIVE Sensitive     CLINDAMYCIN <=0.25 SENSITIVE Sensitive     RIFAMPIN <=0.5 SENSITIVE Sensitive     Inducible Clindamycin NEGATIVE Sensitive     * FEW METHICILLIN RESISTANT STAPHYLOCOCCUS AUREUS  Body fluid culture     Status: Abnormal (Preliminary result)   Collection Time: 07/15/19  2:03 PM   Specimen: Fluid  Result Value Ref Range Status   Specimen Description FLUID LEFT PLEURAL  Final   Special Requests NONE  Final   Gram Stain   Final    ABUNDANT WBC PRESENT,BOTH PMN  AND MONONUCLEAR NO ORGANISMS SEEN Performed at [facility], [address] [place], [place] [address]    Culture STAPHYLOCOCCUS AUREUS (A)  Final   Report Status PENDING  Incomplete    Radiology Reports DG Chest 1 View  Result Date: 07/14/2019 CLINICAL DATA:  Status post thoracentesis on the left EXAM: CHEST  1 VIEW COMPARISON:  07/13/2019 FINDINGS: Cardiac shadow is stable. Slight reduction in pleural fluid is noted when compare with the prior exam. No pneumothorax is seen. Slight increased density is noted throughout the left lung which may be related to some re-expansion edema. No bony abnormality is noted. IMPRESSION: No pneumothorax following thoracentesis. There are however changes suggestive of re-expansion edema. Electronically Signed   By: Alcide Clever M.D.   On: 07/14/2019 08:54   DG Chest 2 View  Result Date: 07/13/2019 CLINICAL DATA:  Chest pain, shortness of breath. EXAM: CHEST - 2 VIEW COMPARISON:  October 21, 2014. FINDINGS: The heart size and mediastinal contours are within normal limits. No pneumothorax is noted. Right lung is clear. Mild left pleural effusion is noted with associated left basilar infiltrate or atelectasis. The visualized skeletal structures are unremarkable. IMPRESSION: Mild left pleural effusion is noted with associated left basilar infiltrate or atelectasis. Electronically Signed   By: Lupita Raider M.D.   On: 07/13/2019 16:47   CT CHEST WO CONTRAST  Result Date: 07/14/2019 CLINICAL DATA:  And pain in a status post thoracentesis EXAM: CT CHEST WITHOUT CONTRAST TECHNIQUE: Multidetector CT imaging of the chest was performed following the standard protocol without IV contrast. COMPARISON:  07/13/2019 FINDINGS: Cardiovascular: Heart is unremarkable without pericardial effusion. Grossly normal caliber of the thoracic aorta. Evaluation limited without intravenous contrast. Mediastinum/Nodes: Evaluation is limited without IV contrast. The thyroid, trachea, and esophagus are grossly unremarkable. No discrete adenopathy. Lungs/Pleura: Since the prior CT, there is been significant increase in the size of the multilocular complex left pleural effusion. There is progressive consolidation throughout the left lung, with relative sparing of the left apex. No left-sided pneumothorax after thoracentesis. There is a small free-flowing right pleural effusion. Patchy consolidation right lower lobe likely atelectasis. The central airways are patent. Upper Abdomen: No acute abnormality. Musculoskeletal: No acute or destructive bony lesions. Reconstructed images demonstrate no additional findings. IMPRESSION: 1. Enlarging multilocular complex left pleural effusion, compatible with empyema. 2. Progressive left lung consolidation with relative sparing of the left apex. This could reflect underlying pneumonia. 3. Trace free-flowing right pleural effusion, new since prior study. Electronically Signed   By: Sharlet Salina M.D.   On: 07/14/2019 20:08   CT Angio Chest PE W and/or Wo Contrast  Result Date: 07/13/2019 CLINICAL DATA:  Shortness of breath with left-sided chest pain. EXAM: CT ANGIOGRAPHY CHEST WITH CONTRAST TECHNIQUE: Multidetector CT imaging of the chest was performed using the standard protocol during bolus administration of intravenous contrast. Multiplanar CT image reconstructions and MIPs were obtained to evaluate the vascular anatomy. CONTRAST:  75mL OMNIPAQUE IOHEXOL 350 MG/ML SOLN COMPARISON:  None. FINDINGS: Cardiovascular: Heart size normal. Small pericardial effusion evident. No thoracic aortic aneurysm. No filling defects within the opacified pulmonary arteries to suggest the presence of an acute pulmonary embolus Mediastinum/Nodes: 11 mm short axis AP window lymph node visible on image 44/5 nodular soft tissue is identified in the anterior mediastinum, potentially related to small clustered lymph nodes. No right hilar lymphadenopathy. There is abnormal soft tissue attenuation in the left hilum. There is no axillary lymphadenopathy. Lungs/Pleura: Ground-glass opacity is seen  fairly diffusely in the left upper lobe with diffuse consolidation of the lingula with airspace consolidation tracking centrally into the left hilum. There is left lower lobe collapse inferiorly. These changes are associated with a moderate left pleural effusion demonstrating loculation in the apex and anterior upper hemithorax. Nodular pleural thickening or loculated fluid is seen along the medial pleura adjacent to the transverse aorta. No right pleural effusion. Upper Abdomen: Unremarkable. Musculoskeletal: No worrisome lytic or sclerotic osseous abnormality. Review of the MIP images confirms the above findings. IMPRESSION: 1. No CT evidence for acute pulmonary embolus. 2. Diffuse ground-glass opacity in the left upper lobe with diffuse consolidation of the lingula tracking centrally into the left hilum. This is associated with  a moderate left pleural effusion demonstrating loculation and prominent areas of left lower lobe atelectasis. Nodular pleural thickening or loculated fluid is identified along the medial pleura adjacent to the transverse aorta. Mildly enlarged AP window lymph node with question of soft tissue nodularity/clustered lymph nodes in the anterior mediastinum. These changes may all be related to pneumonia, given the apparent pleural nodularity towards the apex, neoplasm is a distinct concern. Close follow-up recommended. Electronically Signed   By: Misty Stanley M.D.   On: 07/13/2019 20:44   DG CHEST PORT 1 VIEW  Result Date: 07/17/2019 CLINICAL DATA:  Follow-up empyema EXAM: PORTABLE CHEST 1 VIEW COMPARISON:  07/16/2019 FINDINGS: Cardiac shadow is stable. Diffuse infiltrate is noted throughout the left lung. Two left thoracostomy catheters are noted in place. Some persistent pleural fluid is noted consistent with the given clinical history. No pneumothorax is seen. Right lung remains clear. IMPRESSION: Stable appearance of the chest when compare with the prior exam. Electronically Signed   By: Inez Catalina M.D.   On: 07/17/2019 09:59   DG CHEST PORT 1 VIEW  Result Date: 07/16/2019 CLINICAL DATA:  Status post VATS and decortication on the left on 07/15/2019. EXAM: PORTABLE CHEST 1 VIEW COMPARISON:  Yesterday. FINDINGS: Two left chest tubes remain in place. No pneumothorax. Diffuse airspace opacity in the left lung with mild improvement. Possible minimal lateral and apical pleural fluid or thickening on the left. Interval minimal patchy opacity in the right lower lung zone. Stable mild peribronchial thickening on the right. Grossly normal sized heart. Decreased subcutaneous air on the left. Unremarkable bones. Gas distended bowel in the left upper abdomen. IMPRESSION: 1. No pneumothorax. 2. Diffuse left lung pneumonia and/or pulmonary edema, mildly improved. 3. Interval minimal patchy atelectasis or pneumonia in the  right lower lung zone. 4. Decreased subcutaneous air on the left. Electronically Signed   By: Claudie Revering M.D.   On: 07/16/2019 08:51   DG Chest Port 1 View  Result Date: 07/15/2019 CLINICAL DATA:  Post lung surgery follow-up. EXAM: PORTABLE CHEST 1 VIEW COMPARISON:  07/15/2019 FINDINGS: Examination demonstrates adequate lung volumes with 2 left-sided chest tubes present. Mild opacification throughout the left lung with improved aeration compared to the prior exam. Right lung is clear. Cardiomediastinal silhouette is within normal. Mild subcutaneous emphysema over the lower left flank. Remainder the exam is unchanged. IMPRESSION: Hazy opacification over the left lung with interval improved aeration post placement 2 left-sided chest tubes. Electronically Signed   By: Marin Olp M.D.   On: 07/15/2019 14:50   DG Chest Port 1 View  Result Date: 07/15/2019 CLINICAL DATA:  Empyema. EXAM: PORTABLE CHEST 1 VIEW COMPARISON:  CT 07/14/2019.  Chest x-ray 07/14/2019. FINDINGS: Complete opacification left hemithorax is noted on today's exam. This is most consistent  with reaccumulation of left pleural fluid/progressive empyema. Underlying pulmonary infiltrate cannot be excluded. No pneumothorax. Heart size cannot be evaluated no acute bony abnormality. IMPRESSION: Complete opacification left hemithorax is noted on today's exam. This is most consistent with reaccumulation of left pleural fluid/progressive empyema. Electronically Signed   By: Maisie Fus  Register   On: 07/15/2019 08:40   ECHOCARDIOGRAM COMPLETE  Result Date: 07/15/2019    ECHOCARDIOGRAM REPORT   Patient Name:   RICKY DOAN Date of Exam: 07/15/2019 Medical Rec #:  [id number]              Height:       57.0 in Accession #:    [id number]             Weight:       95.8 lb Date of Birth:  February 28, 1960               BSA:          1.315 m Patient Age:    59 years               BP:           143/90 mmHg Patient Gender: F                      HR:            120 bpm. Exam Location:  Inpatient Procedure: 2D Echo Indications:    Elevated Troponin  History:        Patient has no prior history of Echocardiogram examinations.                 Signs/Symptoms:Shortness of Breath. GERD. Shortness of breath                 with associated chest and shoulder pain, fever. Sepsis secondary                 to pneumonia, with loculated empyema.  Sonographer:    Leta Jungling RDCS Referring Phys: [id number] ANASTASSIA DOUTOVA IMPRESSIONS  1. Left ventricular ejection fraction, by estimation, is 55%. The left ventricle has normal function. The left ventricle has no regional wall motion abnormalities. Left ventricular diastolic parameters are indeterminate.  2. Right ventricular systolic function is normal. The right ventricular size is normal. Tricuspid regurgitation signal is inadequate for assessing PA pressure.  3. The mitral valve is normal in structure. Trivial mitral valve regurgitation. No evidence of mitral stenosis.  4. The aortic valve is normal in structure. Aortic valve regurgitation is not visualized. No aortic stenosis is present.  5. Dilated pulmonary artery.  6. The inferior vena cava is normal in size with greater than 50% respiratory variability, suggesting right atrial pressure of 3 mmHg. FINDINGS  Left Ventricle: Left ventricular ejection fraction, by estimation, is 55%. The left ventricle has normal function. The left ventricle has no regional wall motion abnormalities. The left ventricular internal cavity size was normal in size. There is no left ventricular hypertrophy. Left ventricular diastolic parameters are indeterminate. Right Ventricle: The right ventricular size is normal. No increase in right ventricular wall thickness. Right ventricular systolic function is normal. Tricuspid regurgitation signal is inadequate for assessing PA pressure. Left Atrium: Left atrial size was normal in size. Right Atrium: Right atrial size was normal in size. Pericardium: There is no  evidence of pericardial effusion. Mitral Valve: The mitral valve is normal in structure. Normal mobility of the mitral valve leaflets. Trivial mitral valve regurgitation.  No evidence of mitral valve stenosis. Tricuspid Valve: The tricuspid valve is normal in structure. Tricuspid valve regurgitation is not demonstrated. No evidence of tricuspid stenosis. Aortic Valve: The aortic valve is normal in structure. Aortic valve regurgitation is not visualized. No aortic stenosis is present. Pulmonic Valve: The pulmonic valve was normal in structure. Pulmonic valve regurgitation is not visualized. No evidence of pulmonic stenosis. Aorta: The aortic root is normal in size and structure. Pulmonary Artery: The pulmonary artery is dilated. Venous: The inferior vena cava is normal in size with greater than 50% respiratory variability, suggesting right atrial pressure of 3 mmHg. IAS/Shunts: There is redundancy of the interatrial septum. No atrial level shunt detected by color flow Doppler.  LEFT VENTRICLE PLAX 2D LVIDd:         3.70 cm LVIDs:         2.70 cm LV PW:         0.70 cm LV IVS:        0.90 cm LVOT diam:     1.90 cm LV SV:         34 LV SV Index:   26 LVOT Area:     2.84 cm  LV Volumes (MOD) LV vol d, MOD A2C: 73.8 ml LV vol d, MOD A4C: 71.2 ml LV vol s, MOD A2C: 34.9 ml LV vol s, MOD A4C: 36.1 ml LV SV MOD A2C:     38.9 ml LV SV MOD A4C:     71.2 ml LV SV MOD BP:      37.4 ml RIGHT VENTRICLE RV S prime:     17.70 cm/s TAPSE (M-mode): 1.9 cm LEFT ATRIUM             Index       RIGHT ATRIUM          Index LA diam:        3.00 cm 2.28 cm/m  RA Area:     7.41 cm LA Vol (A2C):   26.7 ml 20.30 ml/m RA Volume:   12.50 ml 9.50 ml/m LA Vol (A4C):   23.3 ml 17.71 ml/m LA Biplane Vol: 27.2 ml 20.68 ml/m  AORTIC VALVE LVOT Vmax:   89.50 cm/s LVOT Vmean:  61.600 cm/s LVOT VTI:    0.120 m  AORTA Ao Root diam: 2.90 cm Ao Asc diam:  2.70 cm  SHUNTS Systemic VTI:  0.12 m Systemic Diam: 1.90 cm Weston Brass MD Electronically  signed by Weston Brass MD Signature Date/Time: 07/15/2019/12:54:26 PM    Final    VAS [place] LOWER EXTREMITY VENOUS (DVT)  Result Date: 07/15/2019  Lower Venous DVTStudy Indications: Elevated ddimer.  Comparison Study: no prior Performing Technologist: Blanch Media RVS  Examination Guidelines: A complete evaluation includes B-mode imaging, spectral Doppler, color Doppler, and power Doppler as needed of all accessible portions of each vessel. Bilateral testing is considered an integral part of a complete examination. Limited examinations for reoccurring indications may be performed as noted. The reflux portion of the exam is performed with the patient in reverse Trendelenburg.  +---------+---------------+---------+-----------+----------+--------------+ RIGHT    CompressibilityPhasicitySpontaneityPropertiesThrombus Aging +---------+---------------+---------+-----------+----------+--------------+ CFV      Full           Yes      Yes                                 +---------+---------------+---------+-----------+----------+--------------+ SFJ      Full                                                        +---------+---------------+---------+-----------+----------+--------------+  FV Prox  Full                                                        +---------+---------------+---------+-----------+----------+--------------+ FV Mid   Full                                                        +---------+---------------+---------+-----------+----------+--------------+ FV DistalFull                                                        +---------+---------------+---------+-----------+----------+--------------+ PFV      Full                                                        +---------+---------------+---------+-----------+----------+--------------+ POP      Full           Yes      Yes                                  +---------+---------------+---------+-----------+----------+--------------+ PTV      Full                                                        +---------+---------------+---------+-----------+----------+--------------+ PERO     Full                                                        +---------+---------------+---------+-----------+----------+--------------+   +---------+---------------+---------+-----------+----------+--------------+ LEFT     CompressibilityPhasicitySpontaneityPropertiesThrombus Aging +---------+---------------+---------+-----------+----------+--------------+ CFV      Full           Yes      Yes                                 +---------+---------------+---------+-----------+----------+--------------+ SFJ      Full                                                        +---------+---------------+---------+-----------+----------+--------------+ FV Prox  Full                                                        +---------+---------------+---------+-----------+----------+--------------+  FV Mid   Full                                                        +---------+---------------+---------+-----------+----------+--------------+ FV DistalFull                                                        +---------+---------------+---------+-----------+----------+--------------+ PFV      Full                                                        +---------+---------------+---------+-----------+----------+--------------+ POP      Full           Yes      Yes                                 +---------+---------------+---------+-----------+----------+--------------+ PTV      Full                                                        +---------+---------------+---------+-----------+----------+--------------+ PERO     Full                                                         +---------+---------------+---------+-----------+----------+--------------+     Summary: BILATERAL: - No evidence of deep vein thrombosis seen in the lower extremities, bilaterally. - RIGHT: - A cystic structure is found in the popliteal fossa.  LEFT: - No cystic structure found in the popliteal fossa.  *See table(s) above for measurements and observations. Electronically signed by Sherald Hess MD on 07/15/2019 at 5:26:31 PM.    Final    IR THORACENTESIS ASP PLEURAL SPACE W/IMG GUIDE  Result Date: 07/14/2019 INDICATION: Patient with history of recurrent pneumonia, presents with shortness of breath and left pleural effusion. Request is made for diagnostic and therapeutic left thoracentesis. EXAM: ULTRASOUND GUIDED DIAGNOSTIC AND THERAPEUTIC LEFT THORACENTESIS MEDICATIONS: 10 mL 1% lidocaine COMPLICATIONS: SIR Level A - No therapy, no consequence. PROCEDURE: An ultrasound guided thoracentesis was thoroughly discussed with the patient and questions answered. The benefits, risks, alternatives and complications were also discussed. The patient understands and wishes to proceed with the procedure. Written consent was obtained. Ultrasound was performed to localize and mark an adequate pocket of fluid in the left chest. The area was then prepped and draped in the normal sterile fashion. 1% Lidocaine was used for local anesthesia. Under ultrasound guidance a 6 Fr Safe-T-Centesis catheter was introduced. Thoracentesis was performed. The catheter was removed and a dressing applied. FINDINGS: A total of approximately 250 mL of cloudy, purulent-appearing fluid was removed. Samples were sent  to the laboratory as requested by the clinical team. Postprocedure chest x-ray shows show suggestion of re-expansion edema. IMPRESSION: Successful ultrasound guided diagnostic and therapeutic thoracentesis yielding 250 mL of pleural fluid. Read by: Loyce Dys PA-C Electronically Signed   By: Gilmer Mor D.O.   On: 07/14/2019  11:36     Huey Bienenstock M.D on 07/17/2019 at 2:53 PM  Between 7am to 7pm - Pager - [phone]  After 7pm go to [email or web address] - password [email or web address]  [facility] -  Office  [phone]

## 2019-07-18 ENCOUNTER — Inpatient Hospital Stay (HOSPITAL_COMMUNITY)

## 2019-07-18 DIAGNOSIS — J869 Pyothorax without fistula: Secondary | ICD-10-CM | POA: Diagnosis not present

## 2019-07-18 DIAGNOSIS — E871 Hypo-osmolality and hyponatremia: Secondary | ICD-10-CM | POA: Diagnosis not present

## 2019-07-18 LAB — BODY FLUID CULTURE

## 2019-07-18 LAB — CBC
HCT: 39.3 % (ref 36.0–46.0)
Hemoglobin: 12.9 g/dL (ref 12.0–15.0)
MCH: 29.3 pg (ref 26.0–34.0)
MCHC: 32.8 g/dL (ref 30.0–36.0)
MCV: 89.3 fL (ref 80.0–100.0)
Platelets: 417 10*3/uL — ABNORMAL HIGH (ref 150–400)
RBC: 4.4 MIL/uL (ref 3.87–5.11)
RDW: 12 % (ref 11.5–15.5)
WBC: 18.7 10*3/uL — ABNORMAL HIGH (ref 4.0–10.5)
nRBC: 0 % (ref 0.0–0.2)

## 2019-07-18 LAB — BASIC METABOLIC PANEL
Anion gap: 11 (ref 5–15)
BUN: 8 mg/dL (ref 8–23)
CO2: 25 mmol/L (ref 22–32)
Calcium: 8.1 mg/dL — ABNORMAL LOW (ref 8.9–10.3)
Chloride: 98 mmol/L (ref 98–111)
Creatinine, Ser: 0.42 mg/dL — ABNORMAL LOW (ref 0.44–1.00)
GFR calc Af Amer: >60 mL/min (ref >60)
GFR calc non Af Amer: >60 mL/min (ref >60)
Glucose, Bld: 90 mg/dL (ref 70–99)
Potassium: 3.6 mmol/L (ref 3.5–5.1)
Sodium: 134 mmol/L — ABNORMAL LOW (ref 135–145)

## 2019-07-18 LAB — CULTURE, BLOOD (ROUTINE X 2)
Culture: NO GROWTH
Culture: NO GROWTH
Special Requests: ADEQUATE

## 2019-07-18 MED ORDER — ENSURE ENLIVE PO LIQD
237.0000 mL | Freq: Two times a day (BID) | ORAL | Status: DC
  Administered 2019-07-18 – 2019-07-21 (×4): 237 mL via ORAL

## 2019-07-18 MED ORDER — BISACODYL 5 MG PO TBEC: 10.0000 mg | DELAYED_RELEASE_TABLET | Freq: Once | ORAL | Status: DC

## 2019-07-18 NOTE — Progress Notes (Signed)
3 Days Post-Op Procedure(s) (LRB): VIDEO ASSISTED THORACOSCOPY (VATS)/DECORTICATION and Drainage of Empyema (Left) Subjective: Pain at chest tube site Productive cough  Objective: Vital signs in last 24 hours: Temp:  [97.5 F (36.4 C)-98.4 F (36.9 C)] 98.4 F (36.9 C) (05/23 0806) Pulse Rate:  [102-118] 115 (05/23 0806) Cardiac Rhythm: Sinus tachycardia (05/23 0901) Resp:  [21-35] 25 (05/23 0814) BP: (116-147)/(70-85) 147/75 (05/23 0806) SpO2:  [93 %-97 %] 94 % (05/23 0901) FiO2 (%):  [28 %] 28 % (05/23 0901)  Hemodynamic parameters for last 24 hours:    Intake/Output from previous day: 05/22 0701 - 05/23 0700 In: 1043 [P.O.:840; I.V.:3; IV Piggyback:200] Out: 430 [Urine:350; Chest Tube:80] Intake/Output this shift: No intake/output data recorded.  General appearance: alert and cooperative Neurologic: intact Lungs: diminished breath sounds LLL Wound: c/d/i  Lab Results: Recent Labs    07/17/19 0343 07/18/19 0417  WBC 25.7* 18.7*  HGB 12.5 12.9  HCT 38.1 39.3  PLT 423* 417*   BMET:  Recent Labs    07/17/19 0343 07/18/19 0417  NA 134* 134*  K 4.3 3.6  CL 100 98  CO2 22 25  GLUCOSE 68* 90  BUN 12 8  CREATININE 0.59 0.42*  CALCIUM 8.4* 8.1*    PT/INR:  Recent Labs    07/15/19 1730  LABPROT 15.8*  INR 1.3*   ABG    Component Value Date/Time   PHART 7.350 07/15/2019 1645   HCO3 24.0 07/15/2019 1645   ACIDBASEDEF 0.9 07/15/2019 1645   O2SAT 87.4 07/15/2019 1645   CBG (last 3)  Recent Labs    07/17/19 0800  GLUCAP 88    Assessment/Plan: S/P Procedure(s) (LRB): VIDEO ASSISTED THORACOSCOPY (VATS)/DECORTICATION and Drainage of Empyema (Left) Mobilize d/c tubes/lines  Consider d/c 1st of week.   LOS: 5 days    Linden Dolin 07/18/2019

## 2019-07-18 NOTE — Progress Notes (Addendum)
Occupational Therapy Treatment Patient Details Name: Sally Buck MRN: [id number] DOB: [birth year] Today's Date: 07/18/2019    History of present illness Patient is a 59 year old female admitted with cough that has progressively been getting worse.  Pt dx with left empyema s/p VATs 5/20.PMhx: asthma   OT comments  Pt. Seen for skilled OT treatment session.  Pt. Reports not feeling well.  Declines any oob activity this day.  Agreeable to education and review of energy conservation strategies.  Provided handouts along with examples of how to integrate into her daily routine and ADL completion. Will plan for adls next session as pt. Able.     Follow Up Recommendations  Home health OT    Equipment Recommendations  None recommended by OT    Recommendations for Other Services      Precautions / Restrictions Precautions Precautions: Fall Precaution Comments: watch sats Restrictions Weight Bearing Restrictions: No       Mobility Bed Mobility  Assisted pt. With repositioning and pulling up in bed.  Pt. With good leg strength and was able to assist with b les to push up in bed. Also bridge hips for pad adjustment.                  Transfers                      Balance                                           ADL either performed or assessed with clinical judgement   ADL Overall ADL's : Needs assistance/impaired                                       General ADL Comments: pt. states she is not feeling well today.  declined any eob/oob.  provided energy conservation handouts and reviewed examples of how to integrate during adl completion     Vision       Perception     Praxis      Cognition Arousal/Alertness: Lethargic Behavior During Therapy: WFL for tasks assessed/performed Overall Cognitive Status: Within Functional Limits for tasks assessed                                           Exercises     Shoulder Instructions       General Comments      Pertinent Vitals/ Pain       Pain Assessment: Faces Faces Pain Scale: Hurts even more Pain Location: L side chest and just not feeling well today Pain Descriptors / Indicators: Sore  Home Living                                          Prior Functioning/Environment              Frequency  Min 2X/week        Progress Toward Goals  OT Goals(current goals can now be found in the care plan section)  Progress towards OT goals: Progressing toward goals  Plan      Co-evaluation                 AM-PAC OT "6 Clicks" Daily Activity     Outcome Measure   Help from another person eating meals?: None Help from another person taking care of personal grooming?: A Little Help from another person toileting, which includes using toliet, bedpan, or urinal?: A Little Help from another person bathing (including washing, rinsing, drying)?: A Little Help from another person to put on and taking off regular upper body clothing?: A Little Help from another person to put on and taking off regular lower body clothing?: A Little 6 Click Score: 19    End of Session Equipment Utilized During Treatment: Oxygen  OT Visit Diagnosis: Other abnormalities of gait and mobility (R26.89);Muscle weakness (generalized) (M62.81)   Activity Tolerance Patient limited by fatigue;Patient limited by lethargy   Patient Left in bed;with call bell/phone within reach   Nurse Communication          Time: 1018-1030 OT Time Calculation (min): 12 min  Charges: OT General Charges $OT Visit: 1 Visit OT Treatments $Self Care/Home Management : 8-22 mins  Sonia Baller, COTA/L Acute Rehabilitation [phone]   Janice Coffin 07/18/2019, 11:42 AM

## 2019-07-18 NOTE — Progress Notes (Signed)
PROGRESS NOTE                                                                                                                                                                                                             Patient Demographics:    Sally Buck, is a 59 y.o. female, DOB - Dec 13, 1960, ZOX:[id number]  Admit date - 07/13/2019   Admitting Physician Therisa Doyne, MD  Outpatient Primary MD for the patient is Marva Panda, NP  LOS - 5   Chief Complaint  Patient presents with  . Allergic Reaction       Brief Narrative    59 yo female with past medical history of recurrent pneumonia, GERD, and Hyperlipidemia who presented with a chief complaint of shortness of breath with associated chest and shoulder pain, progressive shortness of breath, fever and chills, patient reports she has had multiple episodes of pneumonia in the past ,On admission patient was seen febrile, tachypneic, tachycardic,Chest CT was then obtained that revealed significant for loculated pleural effusion vs empyema. IR was consulted and preformed US guided left thoracentesis that yielded of cloudy thick purulent-appearing fluid. Post thoracentesis patient continues to remain febrile with worsening leukocytosis, increased work of breathing, PCCM were consulted, recommendation for CT surgery evaluation and surgery.    Subjective:    Sally Buck today does report some cough, she does report pain is little bit controlled today, no fever or chills .   Assessment  & Plan :    Active Problems:   CAP (community acquired pneumonia)   Empyema (HCC)   Tachycardia   Sepsis (HCC)   Dehydration   Hyponatremia  Acute hypoxic resp failute and Sepsis secondary to pneumonia, with loculated empyema. -Sepsis present on admission, leukocytosis, tachypneic, tachycardic and hypoxia with elevated procalcitonin. -Blood cultures no growth to date, postthoracentesis to 50 cc  of cloudy thick purulent drainage, wit pleural effusion growing MRSA . -Patient with worsening sepsis, leukocytosis and increased work of breathing secondary to her to loculated empyema, went for VATS, and empyema evacuation by CT surgery 5/20 . -Currently on broad-spectrum antibiotic vancomycin, cefepime and Flagyl, initially up for effusion growing MRSA, continue with IV vancomycin for now -Management of chest tube, and pain management per CT surgery. -Leukocytosis trending down which is reassuring   COVID-19 Labs  No results for input(s): DDIMER, FERRITIN,  LDH, CRP in the last 72 hours.  Lab Results  Component Value Date   SARSCOV2NAA NEGATIVE 07/13/2019   Elevated D-dimers -Is most likely in the setting of severe chest infection, CTA chest negative for PE, venous Dopplers negative for DVT .  Hyponatremia -Mild, symptomatic, continue to monitor  Hypomagnesemia -Repleted  History of asthma -No active wheezing   Code Status : Full  Family Communication  : None at bedside.  Disposition Plan  :  Status is: Inpatient  Remains inpatient appropriate because:Hemodynamically unstable   Dispo: The patient is from: Home              Anticipated d/c is to: Home              Anticipated d/c date is: 2 days              Patient currently is not medically stable to d/c.      Consults  :  PCCM , CT surgery  Procedures  :  -Thoracentesis by IR. -  VIDEO ASSISTED THORACOSCOPY (VATS)/DECORTICATION and Drainage of Empyema (Left)  5/20 by Dr. Vickey Sages  DVT Prophylaxis  : [place] Lovenox  Lab Results  Component Value Date   PLT 417 (H) 07/18/2019    Antibiotics  :    Anti-infectives (From admission, onward)   Start     Dose/Rate Route Frequency Ordered Stop   07/15/19 2200  vancomycin (VANCOREADY) IVPB 500 mg/100 mL     500 mg 100 mL/hr over 60 Minutes Intravenous Every 12 hours 07/15/19 0723     07/15/19 1623  ceFAZolin (ANCEF) IVPB 2g/100 mL premix     2 g 200 mL/hr over  30 Minutes Intravenous 30 min pre-op 07/15/19 1623     07/15/19 0800  metroNIDAZOLE (FLAGYL) IVPB 500 mg  Status:  Discontinued     500 mg 100 mL/hr over 60 Minutes Intravenous Every 8 hours 07/15/19 0711 07/17/19 1102   07/15/19 0800  vancomycin (VANCOCIN) IVPB 1000 mg/200 mL premix     1,000 mg 200 mL/hr over 60 Minutes Intravenous  Once 07/15/19 0723 07/15/19 1124   07/15/19 0800  ceFEPIme (MAXIPIME) 2 g in sodium chloride 0.9 % 100 mL IVPB  Status:  Discontinued     2 g 200 mL/hr over 30 Minutes Intravenous Every 12 hours 07/15/19 0723 07/17/19 1102   07/14/19 2200  cefTRIAXone (ROCEPHIN) 2 g in sodium chloride 0.9 % 100 mL IVPB  Status:  Discontinued     2 g 200 mL/hr over 30 Minutes Intravenous Every 24 hours 07/13/19 2352 07/15/19 0711   07/14/19 2000  azithromycin (ZITHROMAX) 500 mg in sodium chloride 0.9 % 250 mL IVPB  Status:  Discontinued     500 mg 250 mL/hr over 60 Minutes Intravenous Every 24 hours 07/13/19 2352 07/14/19 1559   07/14/19 0000  cefTRIAXone (ROCEPHIN) 2 g in sodium chloride 0.9 % 100 mL IVPB     2 g 200 mL/hr over 30 Minutes Intravenous  Once 07/13/19 2357 07/14/19 0235   07/13/19 1715  levofloxacin (LEVAQUIN) IVPB 750 mg     750 mg 100 mL/hr over 90 Minutes Intravenous  Once 07/13/19 1706 07/13/19 1937        Objective:   Vitals:   07/18/19 1200 07/18/19 1235 07/18/19 1300 07/18/19 1400  BP: 114/64  126/66 126/71  Pulse: (!) 118 (!) 114 (!) 116 (!) 113  Resp: (!) 26 (!) 22 (!) 26 (!) 24  Temp: 98.3 F (36.8 C)  98  F (36.7 C) 98.2 F (36.8 C)  TempSrc: Oral     SpO2: 92% 93% 94% 95%  Weight:      Height:        Wt Readings from Last 3 Encounters:  07/13/19 43.5 kg  07/11/15 42 kg  10/21/14 42.2 kg     Intake/Output Summary (Last 24 hours) at 07/18/2019 1444 Last data filed at 07/17/2019 2247 Gross per 24 hour  Intake 460 ml  Output 430 ml  Net 30 ml     Physical Exam  Awake Alert, Oriented X 3, be uncomfortable secondary to  pain Symmetrical Chest wall movement, diminished air entry on the left with rhonchi RRR,No Gallops,Rubs or new Murmurs, No Parasternal Heave +ve B.Sounds, Abd Soft, No tenderness, No rebound - guarding or rigidity. No Cyanosis, Clubbing or edema, No new Rash or bruise        Data Review:    CBC Recent Labs  Lab 07/14/19 0517 07/14/19 0517 07/15/19 0426 07/15/19 1730 07/16/19 0326 07/17/19 0343 07/18/19 0417  WBC 16.8*   < > 32.4* 37.6* 35.4* 25.7* 18.7*  HGB 12.0   < > 12.6 11.7* 11.3* 12.5 12.9  HCT 36.0   < > 37.3 35.0* 33.5* 38.1 39.3  PLT 390   < > 415* 399 367 423* 417*  MCV 89.6   < > 90.5 90.2 90.1 90.7 89.3  MCH 29.9   < > 30.6 30.2 30.4 29.8 29.3  MCHC 33.3   < > 33.8 33.4 33.7 32.8 32.8  RDW 11.6   < > 11.9 12.0 12.0 12.0 12.0  LYMPHSABS 0.3*  --   --   --   --   --   --   MONOABS 0.9  --   --   --   --   --   --   EOSABS 0.0  --   --   --   --   --   --   BASOSABS 0.1  --   --   --   --   --   --    < > = values in this interval not displayed.    Chemistries  Recent Labs  Lab 07/14/19 0517 07/14/19 0517 07/15/19 0426 07/15/19 1730 07/16/19 0326 07/17/19 0343 07/18/19 0417  NA 131*   < > 132* 133* 131* 134* 134*  K 4.0   < > 3.7 4.1 4.6 4.3 3.6  CL 98   < > 99 100 100 100 98  CO2 23   < > 21* 24 24 22 25   GLUCOSE 126*   < > 123* 154* 200* 68* 90  BUN 7*   < > 9 10 10 12 8   CREATININE 0.65   < > 0.72 0.64 0.51 0.59 0.42*  CALCIUM 8.5*   < > 8.4* 8.1* 8.2* 8.4* 8.1*  MG 1.5*  --  2.1  --   --   --   --   AST 34  --  23 29  --  34  --   ALT 54*  --  36 34  --  33  --   ALKPHOS 140*  --  144* 135*  --  194*  --   BILITOT 1.6*  --  1.3* 0.9  --  1.4*  --    < > = values in this interval not displayed.   ------------------------------------------------------------------------------------------------------------------ No results for input(s): CHOL, HDL, LDLCALC, TRIG, CHOLHDL, LDLDIRECT in the last 72 hours.  No results found for:  HGBA1C ------------------------------------------------------------------------------------------------------------------ No results for input(s): TSH, T4TOTAL, T3FREE, THYROIDAB in the last 72 hours.  Invalid input(s): FREET3 ------------------------------------------------------------------------------------------------------------------ No results for input(s): VITAMINB12, FOLATE, FERRITIN, TIBC, IRON, RETICCTPCT in the last 72 hours.  Coagulation profile Recent Labs  Lab 07/15/19 0426 07/15/19 1730  INR 1.3* 1.3*    No results for input(s): DDIMER in the last 72 hours.  Cardiac Enzymes No results for input(s): CKMB, TROPONINI, MYOGLOBIN in the last 168 hours.  Invalid input(s): CK ------------------------------------------------------------------------------------------------------------------ No results found for: BNP  Inpatient Medications  Scheduled Meds: . acetaminophen  1,000 mg Oral Q6H   Or  . acetaminophen (TYLENOL) oral liquid 160 mg/5 mL  1,000 mg Oral Q6H  . bisacodyl  10 mg Oral Daily  . bupivacaine liposome  20 mL Infiltration Once  . docusate sodium  100 mg Oral BID  . enoxaparin (LOVENOX) injection  40 mg Subcutaneous Daily  . feeding supplement (ENSURE ENLIVE)  237 mL Oral BID BM  . fentaNYL   Intravenous Q4H  . latanoprost  1 drop Both Eyes QHS  . loratadine  10 mg Oral Daily  . mouth rinse  15 mL Mouth Rinse BID  . mometasone-formoterol  2 puff Inhalation BID  . pneumococcal 23 valent vaccine  0.5 mL Intramuscular Tomorrow-1000  . polyethylene glycol  17 g Oral BID  . senna-docusate  1 tablet Oral QHS  . sodium chloride flush  3 mL Intravenous Q12H   Continuous Infusions: .  ceFAZolin (ANCEF) IV    . lactated ringers 10 mL/hr at 07/15/19 1206  . sodium chloride    . vancomycin 500 mg (07/18/19 0802)   PRN Meds:.acetaminophen **OR** [DISCONTINUED] acetaminophen, diphenhydrAMINE **OR** diphenhydrAMINE, guaiFENesin-dextromethorphan,  ipratropium-albuterol, levalbuterol, naloxone **AND** sodium chloride flush, ondansetron **OR** ondansetron (ZOFRAN) IV, oxyCODONE, traMADol  Micro Results Recent Results (from the past 240 hour(s))  Culture, blood (Routine X 2) w Reflex to ID Panel     Status: None   Collection Time: 07/13/19  5:30 PM   Specimen: BLOOD RIGHT FOREARM  Result Value Ref Range Status   Specimen Description BLOOD RIGHT FOREARM  Final   Special Requests   Final    BOTTLES DRAWN AEROBIC AND ANAEROBIC Blood Culture adequate volume   Culture   Final    NO GROWTH 5 DAYS Performed at [facility], [address], [place], [place] [address]    Report Status 07/18/2019 FINAL  Final  Culture, blood (Routine X 2) w Reflex to ID Panel     Status: None   Collection Time: 07/13/19  5:30 PM   Specimen: BLOOD  Result Value Ref Range Status   Specimen Description BLOOD LEFT ANTECUBITAL  Final   Special Requests   Final    BOTTLES DRAWN AEROBIC AND ANAEROBIC Blood Culture results may not be optimal due to an inadequate volume of blood received in culture bottles   Culture   Final    NO GROWTH 5 DAYS Performed at [facility], [address], [place], [place] [address]    Report Status 07/18/2019 FINAL  Final  SARS Coronavirus 2 by RT PCR (hospital order, performed in [facility] hospital lab) Nasopharyngeal Nasopharyngeal Swab     Status: None   Collection Time: 07/13/19  5:41 PM   Specimen: Nasopharyngeal Swab  Result Value Ref Range Status   SARS Coronavirus 2 NEGATIVE NEGATIVE Final    Comment: (NOTE) SARS-CoV-2 target nucleic acids are NOT DETECTED. The SARS-CoV-2 RNA is generally detectable in upper and lower  respiratory specimens during the acute phase of infection. The lowest concentration of SARS-CoV-2 viral copies this assay can detect is 250 copies / mL. A negative result does not preclude SARS-CoV-2 infection and should not be used as the sole basis for treatment or other patient management  decisions.  A negative result may occur with improper specimen collection / handling, submission of specimen other than nasopharyngeal swab, presence of viral mutation(s) within the areas targeted by this assay, and inadequate number of viral copies (<250 copies / mL). A negative result must be combined with clinical observations, patient history, and epidemiological information. Fact Sheet for Patients:   BoilerBrush.com.cy Fact Sheet for Healthcare Providers: https://pope.com/ This test is not yet approved or cleared  by the [place] FDA and has been authorized for detection and/or diagnosis of SARS-CoV-2 by FDA under an Emergency Use Authorization (EUA).  This EUA will remain in effect (meaning this test can be used) for the duration of the COVID-19 declaration under Section 564(b)(1) of the Act, 21 U.S.C. section 360bbb-3(b)(1), unless the authorization is terminated or revoked sooner. Performed at [facility], [address], [place], [place] [address]   MRSA PCR Screening     Status: None   Collection Time: 07/14/19  7:30 AM   Specimen: Nasal Mucosa; Nasopharyngeal  Result Value Ref Range Status   MRSA by PCR NEGATIVE NEGATIVE Final    Comment:        The GeneXpert MRSA Assay (FDA approved for NASAL specimens only), is one component of a comprehensive MRSA colonization surveillance program. It is not intended to diagnose MRSA infection nor to guide or monitor treatment for MRSA infections. Performed at [facility], [address], [place], [place] [address]   Respiratory Panel by PCR     Status: None   Collection Time: 07/14/19  7:30 AM   Specimen: Nasal Mucosa; Respiratory  Result Value Ref Range Status   Adenovirus NOT DETECTED NOT DETECTED Final   Coronavirus 229E NOT DETECTED NOT DETECTED Final    Comment: (NOTE) The Coronavirus on the Respiratory Panel, DOES NOT test for the novel  Coronavirus  (2019 nCoV)    Coronavirus HKU1 NOT DETECTED NOT DETECTED Final   Coronavirus NL63 NOT DETECTED NOT DETECTED Final   Coronavirus OC43 NOT DETECTED NOT DETECTED Final   Metapneumovirus NOT DETECTED NOT DETECTED Final   Rhinovirus / Enterovirus NOT DETECTED NOT DETECTED Final   Influenza A NOT DETECTED NOT DETECTED Final   Influenza B NOT DETECTED NOT DETECTED Final   Parainfluenza Virus 1 NOT DETECTED NOT DETECTED Final   Parainfluenza Virus 2 NOT DETECTED NOT DETECTED Final   Parainfluenza Virus 3 NOT DETECTED NOT DETECTED Final   Parainfluenza Virus 4 NOT DETECTED NOT DETECTED Final   Respiratory Syncytial Virus NOT DETECTED NOT DETECTED Final   Bordetella pertussis NOT DETECTED NOT DETECTED Final   Chlamydophila pneumoniae NOT DETECTED NOT DETECTED Final   Mycoplasma pneumoniae NOT DETECTED NOT DETECTED Final    Comment: Performed at [facility], [address], [place], [place] [address]  Gram stain     Status: None   Collection Time: 07/14/19  8:46 AM   Specimen: Lung, Left; Pleural Fluid  Result Value Ref Range Status   Specimen Description PLEURAL FLUID  Final   Special Requests LEFT LUNG  Final   Gram Stain   Final    WBC PRESENT,BOTH PMN AND MONONUCLEAR GRAM POSITIVE COCCI CYTOSPIN SMEAR Gram Stain Report Called to,Read Back  By and Verified With: Elfredia Nevins RN [id number] 07/14/19 A BROWNING Performed at [facility], [address], [place], [place] [address]    Report Status 07/14/2019 FINAL  Final  Culture, body fluid-bottle     Status: Abnormal   Collection Time: 07/14/19  8:46 AM   Specimen: Pleura  Result Value Ref Range Status   Specimen Description PLEURAL FLUID  Final   Special Requests LEFT LUNG  Final   Gram Stain   Final    IN BOTH AEROBIC AND ANAEROBIC BOTTLES GRAM POSITIVE COCCI IN CLUSTERS CRITICAL RESULT CALLED TO, READ BACK BY AND VERIFIED WITH: RN R SANTIAGO [id number] AT 908 AM BY CM    Culture (A)  Final    METHICILLIN RESISTANT STAPHYLOCOCCUS  AUREUS CRITICAL RESULT CALLED TO, READ BACK BY AND VERIFIED WITH: RN R SANTIAGO [phone] AR 908 AM BY CM Performed at [facility], [address], [place], [place] [address]    Report Status 07/16/2019 FINAL  Final   Organism ID, Bacteria METHICILLIN RESISTANT STAPHYLOCOCCUS AUREUS  Final      Susceptibility   Methicillin resistant staphylococcus aureus - MIC*    CIPROFLOXACIN >=8 RESISTANT Resistant     ERYTHROMYCIN >=8 RESISTANT Resistant     GENTAMICIN <=0.5 SENSITIVE Sensitive     OXACILLIN >=4 RESISTANT Resistant     TETRACYCLINE <=1 SENSITIVE Sensitive     VANCOMYCIN 1 SENSITIVE Sensitive     TRIMETH/SULFA <=10 SENSITIVE Sensitive     CLINDAMYCIN <=0.25 SENSITIVE Sensitive     RIFAMPIN <=0.5 SENSITIVE Sensitive     Inducible Clindamycin NEGATIVE Sensitive     * METHICILLIN RESISTANT STAPHYLOCOCCUS AUREUS  Anaerobic culture     Status: None (Preliminary result)   Collection Time: 07/15/19  2:03 PM   Specimen: Pleural, Left; Body Fluid  Result Value Ref Range Status   Specimen Description FLUID LEFT PLEURAL  Final   Special Requests NONE  Final   Gram Stain   Final    ABUNDANT WBC PRESENT,BOTH PMN AND MONONUCLEAR NO ORGANISMS SEEN Performed at [facility], [address] [place], [place] [address]    Culture   Final    NO ANAEROBES ISOLATED; CULTURE IN PROGRESS FOR 5 DAYS   Report Status PENDING  Incomplete  Anaerobic culture     Status: None (Preliminary result)   Collection Time: 07/15/19  2:03 PM   Specimen: Pleural, Left; Body Fluid  Result Value Ref Range Status   Specimen Description FLUID LEFT PLEURAL  Final   Special Requests PEEL  Final   Gram Stain   Final    ABUNDANT WBC PRESENT,BOTH PMN AND MONONUCLEAR RARE GRAM POSITIVE COCCI Performed at [facility], [address] [place], [place] [address]    Culture   Final    NO ANAEROBES ISOLATED; CULTURE IN PROGRESS FOR 5 DAYS   Report Status PENDING  Incomplete  Body fluid culture      Status: None   Collection Time: 07/15/19  2:03 PM   Specimen: Pleural, Left; Body Fluid  Result Value Ref Range Status   Specimen Description FLUID LEFT PLEURAL  Final   Special Requests PEEL  Final   Gram Stain   Final    ABUNDANT WBC PRESENT,BOTH PMN AND MONONUCLEAR RARE GRAM POSITIVE COCCI Performed at [facility], [address] [place], [place] [address]    Culture FEW METHICILLIN RESISTANT STAPHYLOCOCCUS AUREUS  Final   Report Status 07/17/2019 FINAL  Final  Organism ID, Bacteria METHICILLIN RESISTANT STAPHYLOCOCCUS AUREUS  Final      Susceptibility   Methicillin resistant staphylococcus aureus - MIC*    CIPROFLOXACIN >=8 RESISTANT Resistant     ERYTHROMYCIN >=8 RESISTANT Resistant     GENTAMICIN <=0.5 SENSITIVE Sensitive     OXACILLIN >=4 RESISTANT Resistant     TETRACYCLINE <=1 SENSITIVE Sensitive     VANCOMYCIN 1 SENSITIVE Sensitive     TRIMETH/SULFA <=10 SENSITIVE Sensitive     CLINDAMYCIN <=0.25 SENSITIVE Sensitive     RIFAMPIN <=0.5 SENSITIVE Sensitive     Inducible Clindamycin NEGATIVE Sensitive     * FEW METHICILLIN RESISTANT STAPHYLOCOCCUS AUREUS  Body fluid culture     Status: Abnormal   Collection Time: 07/15/19  2:03 PM   Specimen: Fluid  Result Value Ref Range Status   Specimen Description FLUID LEFT PLEURAL  Final   Special Requests NONE  Final   Gram Stain   Final    ABUNDANT WBC PRESENT,BOTH PMN AND MONONUCLEAR NO ORGANISMS SEEN    Culture (A)  Final    METHICILLIN RESISTANT STAPHYLOCOCCUS AUREUS SUSCEPTIBILITIES PERFORMED ON PREVIOUS CULTURE WITHIN THE LAST 5 DAYS. Performed at [facility], [place] [address], [place], [place] [address]    Report Status 07/18/2019 FINAL  Final   Organism ID, Bacteria METHICILLIN RESISTANT STAPHYLOCOCCUS AUREUS  Final      Susceptibility   Methicillin resistant staphylococcus aureus - MIC*    CIPROFLOXACIN >=8 RESISTANT Resistant     ERYTHROMYCIN >=8 RESISTANT Resistant     GENTAMICIN <=0.5 SENSITIVE  Sensitive     OXACILLIN >=4 RESISTANT Resistant     TETRACYCLINE <=1 SENSITIVE Sensitive     VANCOMYCIN 1 SENSITIVE Sensitive     TRIMETH/SULFA <=10 SENSITIVE Sensitive     CLINDAMYCIN <=0.25 SENSITIVE Sensitive     RIFAMPIN <=0.5 SENSITIVE Sensitive     Inducible Clindamycin NEGATIVE Sensitive     * METHICILLIN RESISTANT STAPHYLOCOCCUS AUREUS    Radiology Reports DG Chest 1 View  Result Date: 07/14/2019 CLINICAL DATA:  Status post thoracentesis on the left EXAM: CHEST  1 VIEW COMPARISON:  07/13/2019 FINDINGS: Cardiac shadow is stable. Slight reduction in pleural fluid is noted when compare with the prior exam. No pneumothorax is seen. Slight increased density is noted throughout the left lung which may be related to some re-expansion edema. No bony abnormality is noted. IMPRESSION: No pneumothorax following thoracentesis. There are however changes suggestive of re-expansion edema. Electronically Signed   By: Inez Catalina M.D.   On: 07/14/2019 08:54   DG Chest 2 View  Result Date: 07/13/2019 CLINICAL DATA:  Chest pain, shortness of breath. EXAM: CHEST - 2 VIEW COMPARISON:  October 21, 2014. FINDINGS: The heart size and mediastinal contours are within normal limits. No pneumothorax is noted. Right lung is clear. Mild left pleural effusion is noted with associated left basilar infiltrate or atelectasis. The visualized skeletal structures are unremarkable. IMPRESSION: Mild left pleural effusion is noted with associated left basilar infiltrate or atelectasis. Electronically Signed   By: Marijo Conception M.D.   On: 07/13/2019 16:47   CT CHEST WO CONTRAST  Result Date: 07/14/2019 CLINICAL DATA:  And pain in a status post thoracentesis EXAM: CT CHEST WITHOUT CONTRAST TECHNIQUE: Multidetector CT imaging of the chest was performed following the standard protocol without IV contrast. COMPARISON:  07/13/2019 FINDINGS: Cardiovascular: Heart is unremarkable without pericardial effusion. Grossly normal caliber  of the thoracic aorta. Evaluation limited without intravenous contrast. Mediastinum/Nodes: Evaluation is limited  without IV contrast. The thyroid, trachea, and esophagus are grossly unremarkable. No discrete adenopathy. Lungs/Pleura: Since the prior CT, there is been significant increase in the size of the multilocular complex left pleural effusion. There is progressive consolidation throughout the left lung, with relative sparing of the left apex. No left-sided pneumothorax after thoracentesis. There is a small free-flowing right pleural effusion. Patchy consolidation right lower lobe likely atelectasis. The central airways are patent. Upper Abdomen: No acute abnormality. Musculoskeletal: No acute or destructive bony lesions. Reconstructed images demonstrate no additional findings. IMPRESSION: 1. Enlarging multilocular complex left pleural effusion, compatible with empyema. 2. Progressive left lung consolidation with relative sparing of the left apex. This could reflect underlying pneumonia. 3. Trace free-flowing right pleural effusion, new since prior study. Electronically Signed   By: Sharlet Salina M.D.   On: 07/14/2019 20:08   CT Angio Chest PE W and/or Wo Contrast  Result Date: 07/13/2019 CLINICAL DATA:  Shortness of breath with left-sided chest pain. EXAM: CT ANGIOGRAPHY CHEST WITH CONTRAST TECHNIQUE: Multidetector CT imaging of the chest was performed using the standard protocol during bolus administration of intravenous contrast. Multiplanar CT image reconstructions and MIPs were obtained to evaluate the vascular anatomy. CONTRAST:  75mL OMNIPAQUE IOHEXOL 350 MG/ML SOLN COMPARISON:  None. FINDINGS: Cardiovascular: Heart size normal. Small pericardial effusion evident. No thoracic aortic aneurysm. No filling defects within the opacified pulmonary arteries to suggest the presence of an acute pulmonary embolus Mediastinum/Nodes: 11 mm short axis AP window lymph node visible on image 44/5 nodular soft  tissue is identified in the anterior mediastinum, potentially related to small clustered lymph nodes. No right hilar lymphadenopathy. There is abnormal soft tissue attenuation in the left hilum. There is no axillary lymphadenopathy. Lungs/Pleura: Ground-glass opacity is seen fairly diffusely in the left upper lobe with diffuse consolidation of the lingula with airspace consolidation tracking centrally into the left hilum. There is left lower lobe collapse inferiorly. These changes are associated with a moderate left pleural effusion demonstrating loculation in the apex and anterior upper hemithorax. Nodular pleural thickening or loculated fluid is seen along the medial pleura adjacent to the transverse aorta. No right pleural effusion. Upper Abdomen: Unremarkable. Musculoskeletal: No worrisome lytic or sclerotic osseous abnormality. Review of the MIP images confirms the above findings. IMPRESSION: 1. No CT evidence for acute pulmonary embolus. 2. Diffuse ground-glass opacity in the left upper lobe with diffuse consolidation of the lingula tracking centrally into the left hilum. This is associated with a moderate left pleural effusion demonstrating loculation and prominent areas of left lower lobe atelectasis. Nodular pleural thickening or loculated fluid is identified along the medial pleura adjacent to the transverse aorta. Mildly enlarged AP window lymph node with question of soft tissue nodularity/clustered lymph nodes in the anterior mediastinum. These changes may all be related to pneumonia, given the apparent pleural nodularity towards the apex, neoplasm is a distinct concern. Close follow-up recommended. Electronically Signed   By: Kennith Center M.D.   On: 07/13/2019 20:44   DG CHEST PORT 1 VIEW  Result Date: 07/18/2019 CLINICAL DATA:  Evaluate empyema EXAM: PORTABLE CHEST 1 VIEW COMPARISON:  07/17/2019 FINDINGS: Cardiac shadow is stable. Two chest tubes are again seen on the left. The overall appearance  of the left hemithorax is stable. No pneumothorax is noted. Diffuse opacification throughout the lung is seen with multiple air bronchograms as well as persistent empyema. Right lung remains clear. Bony structures are unremarkable. IMPRESSION: Stable appearance of the chest when compared with the previous day.  Electronically Signed   By: Alcide CleverMark  Lukens M.D.   On: 07/18/2019 08:46   DG CHEST PORT 1 VIEW  Result Date: 07/17/2019 CLINICAL DATA:  Follow-up empyema EXAM: PORTABLE CHEST 1 VIEW COMPARISON:  07/16/2019 FINDINGS: Cardiac shadow is stable. Diffuse infiltrate is noted throughout the left lung. Two left thoracostomy catheters are noted in place. Some persistent pleural fluid is noted consistent with the given clinical history. No pneumothorax is seen. Right lung remains clear. IMPRESSION: Stable appearance of the chest when compare with the prior exam. Electronically Signed   By: Alcide CleverMark  Lukens M.D.   On: 07/17/2019 09:59   DG CHEST PORT 1 VIEW  Result Date: 07/16/2019 CLINICAL DATA:  Status post VATS and decortication on the left on 07/15/2019. EXAM: PORTABLE CHEST 1 VIEW COMPARISON:  Yesterday. FINDINGS: Two left chest tubes remain in place. No pneumothorax. Diffuse airspace opacity in the left lung with mild improvement. Possible minimal lateral and apical pleural fluid or thickening on the left. Interval minimal patchy opacity in the right lower lung zone. Stable mild peribronchial thickening on the right. Grossly normal sized heart. Decreased subcutaneous air on the left. Unremarkable bones. Gas distended bowel in the left upper abdomen. IMPRESSION: 1. No pneumothorax. 2. Diffuse left lung pneumonia and/or pulmonary edema, mildly improved. 3. Interval minimal patchy atelectasis or pneumonia in the right lower lung zone. 4. Decreased subcutaneous air on the left. Electronically Signed   By: Beckie SaltsSteven  Reid M.D.   On: 07/16/2019 08:51   DG Chest Port 1 View  Result Date: 07/15/2019 CLINICAL DATA:  Post  lung surgery follow-up. EXAM: PORTABLE CHEST 1 VIEW COMPARISON:  07/15/2019 FINDINGS: Examination demonstrates adequate lung volumes with 2 left-sided chest tubes present. Mild opacification throughout the left lung with improved aeration compared to the prior exam. Right lung is clear. Cardiomediastinal silhouette is within normal. Mild subcutaneous emphysema over the lower left flank. Remainder the exam is unchanged. IMPRESSION: Hazy opacification over the left lung with interval improved aeration post placement 2 left-sided chest tubes. Electronically Signed   By: Elberta Fortisaniel  Boyle M.D.   On: 07/15/2019 14:50   DG Chest Port 1 View  Result Date: 07/15/2019 CLINICAL DATA:  Empyema. EXAM: PORTABLE CHEST 1 VIEW COMPARISON:  CT 07/14/2019.  Chest x-ray 07/14/2019. FINDINGS: Complete opacification left hemithorax is noted on today's exam. This is most consistent with reaccumulation of left pleural fluid/progressive empyema. Underlying pulmonary infiltrate cannot be excluded. No pneumothorax. Heart size cannot be evaluated no acute bony abnormality. IMPRESSION: Complete opacification left hemithorax is noted on today's exam. This is most consistent with reaccumulation of left pleural fluid/progressive empyema. Electronically Signed   By: Maisie Fushomas  Register   On: 07/15/2019 08:40   ECHOCARDIOGRAM COMPLETE  Result Date: 07/15/2019    ECHOCARDIOGRAM REPORT   Patient Name:   Owens LofflerROBIN YVETTE Dinapoli Date of Exam: 07/15/2019 Medical Rec #:  [id number]              Height:       57.0 in Accession #:    [id number]             Weight:       95.8 lb Date of Birth:  09/21/1960               BSA:          1.315 m Patient Age:    59 years               BP:  143/90 mmHg Patient Gender: F                      HR:           120 bpm. Exam Location:  Inpatient Procedure: 2D Echo Indications:    Elevated Troponin  History:        Patient has no prior history of Echocardiogram examinations.                 Signs/Symptoms:Shortness  of Breath. GERD. Shortness of breath                 with associated chest and shoulder pain, fever. Sepsis secondary                 to pneumonia, with loculated empyema.  Sonographer:    Leta Jungling RDCS Referring Phys: [id number] ANASTASSIA DOUTOVA IMPRESSIONS  1. Left ventricular ejection fraction, by estimation, is 55%. The left ventricle has normal function. The left ventricle has no regional wall motion abnormalities. Left ventricular diastolic parameters are indeterminate.  2. Right ventricular systolic function is normal. The right ventricular size is normal. Tricuspid regurgitation signal is inadequate for assessing PA pressure.  3. The mitral valve is normal in structure. Trivial mitral valve regurgitation. No evidence of mitral stenosis.  4. The aortic valve is normal in structure. Aortic valve regurgitation is not visualized. No aortic stenosis is present.  5. Dilated pulmonary artery.  6. The inferior vena cava is normal in size with greater than 50% respiratory variability, suggesting right atrial pressure of 3 mmHg. FINDINGS  Left Ventricle: Left ventricular ejection fraction, by estimation, is 55%. The left ventricle has normal function. The left ventricle has no regional wall motion abnormalities. The left ventricular internal cavity size was normal in size. There is no left ventricular hypertrophy. Left ventricular diastolic parameters are indeterminate. Right Ventricle: The right ventricular size is normal. No increase in right ventricular wall thickness. Right ventricular systolic function is normal. Tricuspid regurgitation signal is inadequate for assessing PA pressure. Left Atrium: Left atrial size was normal in size. Right Atrium: Right atrial size was normal in size. Pericardium: There is no evidence of pericardial effusion. Mitral Valve: The mitral valve is normal in structure. Normal mobility of the mitral valve leaflets. Trivial mitral valve regurgitation. No evidence of mitral valve  stenosis. Tricuspid Valve: The tricuspid valve is normal in structure. Tricuspid valve regurgitation is not demonstrated. No evidence of tricuspid stenosis. Aortic Valve: The aortic valve is normal in structure. Aortic valve regurgitation is not visualized. No aortic stenosis is present. Pulmonic Valve: The pulmonic valve was normal in structure. Pulmonic valve regurgitation is not visualized. No evidence of pulmonic stenosis. Aorta: The aortic root is normal in size and structure. Pulmonary Artery: The pulmonary artery is dilated. Venous: The inferior vena cava is normal in size with greater than 50% respiratory variability, suggesting right atrial pressure of 3 mmHg. IAS/Shunts: There is redundancy of the interatrial septum. No atrial level shunt detected by color flow Doppler.  LEFT VENTRICLE PLAX 2D LVIDd:         3.70 cm LVIDs:         2.70 cm LV PW:         0.70 cm LV IVS:        0.90 cm LVOT diam:     1.90 cm LV SV:         34 LV SV Index:   26 LVOT Area:  2.84 cm  LV Volumes (MOD) LV vol d, MOD A2C: 73.8 ml LV vol d, MOD A4C: 71.2 ml LV vol s, MOD A2C: 34.9 ml LV vol s, MOD A4C: 36.1 ml LV SV MOD A2C:     38.9 ml LV SV MOD A4C:     71.2 ml LV SV MOD BP:      37.4 ml RIGHT VENTRICLE RV S prime:     17.70 cm/s TAPSE (M-mode): 1.9 cm LEFT ATRIUM             Index       RIGHT ATRIUM          Index LA diam:        3.00 cm 2.28 cm/m  RA Area:     7.41 cm LA Vol (A2C):   26.7 ml 20.30 ml/m RA Volume:   12.50 ml 9.50 ml/m LA Vol (A4C):   23.3 ml 17.71 ml/m LA Biplane Vol: 27.2 ml 20.68 ml/m  AORTIC VALVE LVOT Vmax:   89.50 cm/s LVOT Vmean:  61.600 cm/s LVOT VTI:    0.120 m  AORTA Ao Root diam: 2.90 cm Ao Asc diam:  2.70 cm  SHUNTS Systemic VTI:  0.12 m Systemic Diam: 1.90 cm Weston Brass MD Electronically signed by Weston Brass MD Signature Date/Time: 07/15/2019/12:54:26 PM    Final    VAS [place] LOWER EXTREMITY VENOUS (DVT)  Result Date: 07/15/2019  Lower Venous DVTStudy Indications: Elevated ddimer.   Comparison Study: no prior Performing Technologist: Blanch Media RVS  Examination Guidelines: A complete evaluation includes B-mode imaging, spectral Doppler, color Doppler, and power Doppler as needed of all accessible portions of each vessel. Bilateral testing is considered an integral part of a complete examination. Limited examinations for reoccurring indications may be performed as noted. The reflux portion of the exam is performed with the patient in reverse Trendelenburg.  +---------+---------------+---------+-----------+----------+--------------+ RIGHT    CompressibilityPhasicitySpontaneityPropertiesThrombus Aging +---------+---------------+---------+-----------+----------+--------------+ CFV      Full           Yes      Yes                                 +---------+---------------+---------+-----------+----------+--------------+ SFJ      Full                                                        +---------+---------------+---------+-----------+----------+--------------+ FV Prox  Full                                                        +---------+---------------+---------+-----------+----------+--------------+ FV Mid   Full                                                        +---------+---------------+---------+-----------+----------+--------------+ FV DistalFull                                                        +---------+---------------+---------+-----------+----------+--------------+  PFV      Full                                                        +---------+---------------+---------+-----------+----------+--------------+ POP      Full           Yes      Yes                                 +---------+---------------+---------+-----------+----------+--------------+ PTV      Full                                                        +---------+---------------+---------+-----------+----------+--------------+ PERO     Full                                                         +---------+---------------+---------+-----------+----------+--------------+   +---------+---------------+---------+-----------+----------+--------------+ LEFT     CompressibilityPhasicitySpontaneityPropertiesThrombus Aging +---------+---------------+---------+-----------+----------+--------------+ CFV      Full           Yes      Yes                                 +---------+---------------+---------+-----------+----------+--------------+ SFJ      Full                                                        +---------+---------------+---------+-----------+----------+--------------+ FV Prox  Full                                                        +---------+---------------+---------+-----------+----------+--------------+ FV Mid   Full                                                        +---------+---------------+---------+-----------+----------+--------------+ FV DistalFull                                                        +---------+---------------+---------+-----------+----------+--------------+ PFV      Full                                                        +---------+---------------+---------+-----------+----------+--------------+  POP      Full           Yes      Yes                                 +---------+---------------+---------+-----------+----------+--------------+ PTV      Full                                                        +---------+---------------+---------+-----------+----------+--------------+ PERO     Full                                                        +---------+---------------+---------+-----------+----------+--------------+     Summary: BILATERAL: - No evidence of deep vein thrombosis seen in the lower extremities, bilaterally. - RIGHT: - A cystic structure is found in the popliteal fossa.  LEFT: - No cystic structure found in the  popliteal fossa.  *See table(s) above for measurements and observations. Electronically signed by Sherald Hess MD on 07/15/2019 at 5:26:31 PM.    Final    IR THORACENTESIS ASP PLEURAL SPACE W/IMG GUIDE  Result Date: 07/14/2019 INDICATION: Patient with history of recurrent pneumonia, presents with shortness of breath and left pleural effusion. Request is made for diagnostic and therapeutic left thoracentesis. EXAM: ULTRASOUND GUIDED DIAGNOSTIC AND THERAPEUTIC LEFT THORACENTESIS MEDICATIONS: 10 mL 1% lidocaine COMPLICATIONS: SIR Level A - No therapy, no consequence. PROCEDURE: An ultrasound guided thoracentesis was thoroughly discussed with the patient and questions answered. The benefits, risks, alternatives and complications were also discussed. The patient understands and wishes to proceed with the procedure. Written consent was obtained. Ultrasound was performed to localize and mark an adequate pocket of fluid in the left chest. The area was then prepped and draped in the normal sterile fashion. 1% Lidocaine was used for local anesthesia. Under ultrasound guidance a 6 Fr Safe-T-Centesis catheter was introduced. Thoracentesis was performed. The catheter was removed and a dressing applied. FINDINGS: A total of approximately 250 mL of cloudy, purulent-appearing fluid was removed. Samples were sent to the laboratory as requested by the clinical team. Postprocedure chest x-ray shows show suggestion of re-expansion edema. IMPRESSION: Successful ultrasound guided diagnostic and therapeutic thoracentesis yielding 250 mL of pleural fluid. Read by: Loyce Dys PA-C Electronically Signed   By: Gilmer Mor D.O.   On: 07/14/2019 11:36     Huey Bienenstock M.D on 07/18/2019 at 2:44 PM  Between 7am to 7pm - Pager - [phone]  After 7pm go to [email or web address] - password [facility]  [facility] -  Office  [phone]

## 2019-07-19 ENCOUNTER — Inpatient Hospital Stay (HOSPITAL_COMMUNITY)

## 2019-07-19 DIAGNOSIS — J869 Pyothorax without fistula: Secondary | ICD-10-CM | POA: Diagnosis not present

## 2019-07-19 LAB — BASIC METABOLIC PANEL
Anion gap: 12 (ref 5–15)
BUN: 7 mg/dL — ABNORMAL LOW (ref 8–23)
CO2: 26 mmol/L (ref 22–32)
Calcium: 8.1 mg/dL — ABNORMAL LOW (ref 8.9–10.3)
Chloride: 97 mmol/L — ABNORMAL LOW (ref 98–111)
Creatinine, Ser: 0.43 mg/dL — ABNORMAL LOW (ref 0.44–1.00)
GFR calc Af Amer: >60 mL/min (ref >60)
GFR calc non Af Amer: >60 mL/min (ref >60)
Glucose, Bld: 115 mg/dL — ABNORMAL HIGH (ref 70–99)
Potassium: 3.4 mmol/L — ABNORMAL LOW (ref 3.5–5.1)
Sodium: 135 mmol/L (ref 135–145)

## 2019-07-19 LAB — CBC
HCT: 34.9 % — ABNORMAL LOW (ref 36.0–46.0)
Hemoglobin: 11.4 g/dL — ABNORMAL LOW (ref 12.0–15.0)
MCH: 29.3 pg (ref 26.0–34.0)
MCHC: 32.7 g/dL (ref 30.0–36.0)
MCV: 89.7 fL (ref 80.0–100.0)
Platelets: 447 10*3/uL — ABNORMAL HIGH (ref 150–400)
RBC: 3.89 MIL/uL (ref 3.87–5.11)
RDW: 12.1 % (ref 11.5–15.5)
WBC: 18.6 10*3/uL — ABNORMAL HIGH (ref 4.0–10.5)
nRBC: 0 % (ref 0.0–0.2)

## 2019-07-19 MED ORDER — VANCOMYCIN HCL IN DEXTROSE 500-5 MG/100ML-% IV SOLN
500.0000 mg | Freq: Two times a day (BID) | INTRAVENOUS | Status: DC
  Administered 2019-07-19: 500 mg via INTRAVENOUS
  Filled 2019-07-19 (×2): qty 100

## 2019-07-19 MED ORDER — LINEZOLID 600 MG/300ML IV SOLN
600.0000 mg | Freq: Two times a day (BID) | INTRAVENOUS | Status: DC
  Administered 2019-07-19 – 2019-07-20 (×3): 600 mg via INTRAVENOUS
  Filled 2019-07-19 (×4): qty 300

## 2019-07-19 MED ORDER — POTASSIUM CHLORIDE CRYS ER 20 MEQ PO TBCR
40.0000 meq | EXTENDED_RELEASE_TABLET | Freq: Once | ORAL | Status: AC
  Administered 2019-07-19: 40 meq via ORAL
  Filled 2019-07-19: qty 2

## 2019-07-19 MED ORDER — OXYCODONE HCL 5 MG PO TABS: 5.0000 mg | ORAL_TABLET | ORAL | 0 refills | Status: DC | PRN

## 2019-07-19 NOTE — Progress Notes (Signed)
Pt ambulated in the rm 50 ft. HR went up between 120-126 beats/min. Pt tolerated fair. Got situated to the bed. Pain med administered.   Lawson Radar, RN

## 2019-07-19 NOTE — Progress Notes (Signed)
PROGRESS NOTE                                                                                                                                                                                                             Patient Demographics:    Sally Buck, is a 58 y.o. female, DOB - 08-01-1960, NWG:[id number]  Admit date - 07/13/2019   Admitting Physician Therisa Doyne, MD  Outpatient Primary MD for the patient is Marva Panda, NP  LOS - 6   Chief Complaint  Patient presents with  . Allergic Reaction       Brief Narrative    59 yo female with past medical history of recurrent pneumonia, GERD, and Hyperlipidemia who presented with a chief complaint of shortness of breath with associated chest and shoulder pain, progressive shortness of breath, fever and chills, patient reports she has had multiple episodes of pneumonia in the past ,On admission patient was seen febrile, tachypneic, tachycardic,Chest CT was then obtained that revealed significant for loculated pleural effusion vs empyema. IR was consulted and preformed US guided left thoracentesis that yielded of cloudy thick purulent-appearing fluid. Post thoracentesis patient continues to remain febrile with worsening leukocytosis, increased work of breathing, PCCM were consulted, recommendation for CT surgery evaluation and surgery.    Subjective:    Sally Buck today does report some cough, he is off PCA pump since yesterday, CT chest was discontinued yesterday, reports pain has improved, as well her appetite has improved as well .   Assessment  & Plan :    Active Problems:   CAP (community acquired pneumonia)   Empyema (HCC)   Tachycardia   Sepsis (HCC)   Dehydration   Hyponatremia  Acute hypoxic resp failute and Sepsis secondary to pneumonia, with loculated empyema. -Sepsis present on admission, leukocytosis, tachypneic, tachycardic and hypoxia with elevated  procalcitonin. -Blood cultures no growth to date, postthoracentesis to 50 cc of cloudy thick purulent drainage, wit pleural effusion growing MRSA . -Patient with worsening sepsis, leukocytosis and increased work of breathing secondary to her to loculated empyema, went for VATS, and empyema evacuation by CT surgery 5/20 . -Currently on broad-spectrum antibiotic vancomycin, cefepime and Flagyl, initially up for effusion growing MRSA, vancomycin has been transitioned to IV linezolid for now continue with IV given she still having significant leukocytosis -Management of chest tube,  and pain management per CT surgery.  Chest tube discontinued 5/23, she is today off her fentanyl PCA. -Leukocytosis trending down which is reassuring, but still significantly elevated   COVID-19 Labs  No results for input(s): DDIMER, FERRITIN, LDH, CRP in the last 72 hours.  Lab Results  Component Value Date   SARSCOV2NAA NEGATIVE 07/13/2019   Elevated D-dimers -Is most likely in the setting of severe chest infection, CTA chest negative for PE, venous Dopplers negative for DVT .  Hyponatremia -Mild, symptomatic, continue to monitor  Hypomagnesemia -Repleted  History of asthma -No active wheezing   Code Status : Full  Family Communication  : None at bedside.  Disposition Plan  :  Status is: Inpatient  Remains inpatient appropriate because:Hemodynamically unstable   Dispo: The patient is from: Home              Anticipated d/c is to: Home              Anticipated d/c date is: 2 days              Patient currently is not medically stable to d/c.      Consults  :  PCCM , CT surgery  Procedures  :  -Thoracentesis by IR. -  VIDEO ASSISTED THORACOSCOPY (VATS)/DECORTICATION and Drainage of Empyema (Left)  5/20 by Dr. Vickey Sages  DVT Prophylaxis  : [place] Lovenox  Lab Results  Component Value Date   PLT 447 (H) 07/19/2019    Antibiotics  :    Anti-infectives (From admission, onward)   Start      Dose/Rate Route Frequency Ordered Stop   07/19/19 2200  linezolid (ZYVOX) IVPB 600 mg     600 mg 300 mL/hr over 60 Minutes Intravenous Every 12 hours 07/19/19 1306     07/19/19 1000  vancomycin (VANCOCIN) IVPB 500 mg/100 ml premix  Status:  Discontinued     500 mg 100 mL/hr over 60 Minutes Intravenous Every 12 hours 07/19/19 0610 07/19/19 1306   07/15/19 2200  vancomycin (VANCOREADY) IVPB 500 mg/100 mL  Status:  Discontinued     500 mg 100 mL/hr over 60 Minutes Intravenous Every 12 hours 07/15/19 0723 07/19/19 0608   07/15/19 1623  ceFAZolin (ANCEF) IVPB 2g/100 mL premix     2 g 200 mL/hr over 30 Minutes Intravenous 30 min pre-op 07/15/19 1623     07/15/19 0800  metroNIDAZOLE (FLAGYL) IVPB 500 mg  Status:  Discontinued     500 mg 100 mL/hr over 60 Minutes Intravenous Every 8 hours 07/15/19 0711 07/17/19 1102   07/15/19 0800  vancomycin (VANCOCIN) IVPB 1000 mg/200 mL premix     1,000 mg 200 mL/hr over 60 Minutes Intravenous  Once 07/15/19 0723 07/15/19 1124   07/15/19 0800  ceFEPIme (MAXIPIME) 2 g in sodium chloride 0.9 % 100 mL IVPB  Status:  Discontinued     2 g 200 mL/hr over 30 Minutes Intravenous Every 12 hours 07/15/19 0723 07/17/19 1102   07/14/19 2200  cefTRIAXone (ROCEPHIN) 2 g in sodium chloride 0.9 % 100 mL IVPB  Status:  Discontinued     2 g 200 mL/hr over 30 Minutes Intravenous Every 24 hours 07/13/19 2352 07/15/19 0711   07/14/19 2000  azithromycin (ZITHROMAX) 500 mg in sodium chloride 0.9 % 250 mL IVPB  Status:  Discontinued     500 mg 250 mL/hr over 60 Minutes Intravenous Every 24 hours 07/13/19 2352 07/14/19 1559   07/14/19 0000  cefTRIAXone (ROCEPHIN) 2 g in sodium  chloride 0.9 % 100 mL IVPB     2 g 200 mL/hr over 30 Minutes Intravenous  Once 07/13/19 2357 07/14/19 0235   07/13/19 1715  levofloxacin (LEVAQUIN) IVPB 750 mg     750 mg 100 mL/hr over 90 Minutes Intravenous  Once 07/13/19 1706 07/13/19 1937        Objective:   Vitals:   07/19/19 1441 07/19/19 1500  07/19/19 1546 07/19/19 1622  BP:  124/62  140/70  Pulse: (!) 110 (!) 112 (!) 108 (!) 114  Resp:  20  20  Temp:  98.4 F (36.9 C)  98.2 F (36.8 C)  TempSrc:  Oral  Oral  SpO2:  95%  92%  Weight:      Height:        Wt Readings from Last 3 Encounters:  07/19/19 48.7 kg  07/11/15 42 kg  10/21/14 42.2 kg     Intake/Output Summary (Last 24 hours) at 07/19/2019 1650 Last data filed at 07/19/2019 1500 Gross per 24 hour  Intake 789.97 ml  Output --  Net 789.97 ml     Physical Exam  Awake Alert, Oriented X 3, No new F.N deficits, Normal affect Symmetrical Chest wall movement, diminished air entry st left site. RRR,No Gallops,Rubs or new Murmurs, No Parasternal Heave +ve B.Sounds, Abd Soft, No tenderness, No rebound - guarding or rigidity. No Cyanosis, Clubbing or edema, No new Rash or bruise      Data Review:    CBC Recent Labs  Lab 07/14/19 0517 07/15/19 0426 07/15/19 1730 07/16/19 0326 07/17/19 0343 07/18/19 0417 07/19/19 0322  WBC 16.8*   < > 37.6* 35.4* 25.7* 18.7* 18.6*  HGB 12.0   < > 11.7* 11.3* 12.5 12.9 11.4*  HCT 36.0   < > 35.0* 33.5* 38.1 39.3 34.9*  PLT 390   < > 399 367 423* 417* 447*  MCV 89.6   < > 90.2 90.1 90.7 89.3 89.7  MCH 29.9   < > 30.2 30.4 29.8 29.3 29.3  MCHC 33.3   < > 33.4 33.7 32.8 32.8 32.7  RDW 11.6   < > 12.0 12.0 12.0 12.0 12.1  LYMPHSABS 0.3*  --   --   --   --   --   --   MONOABS 0.9  --   --   --   --   --   --   EOSABS 0.0  --   --   --   --   --   --   BASOSABS 0.1  --   --   --   --   --   --    < > = values in this interval not displayed.    Chemistries  Recent Labs  Lab 07/14/19 0517 07/14/19 0517 07/15/19 0426 07/15/19 0426 07/15/19 1730 07/16/19 0326 07/17/19 0343 07/18/19 0417 07/19/19 0322  NA 131*   < > 132*   < > 133* 131* 134* 134* 135  K 4.0   < > 3.7   < > 4.1 4.6 4.3 3.6 3.4*  CL 98   < > 99   < > 100 100 100 98 97*  CO2 23   < > 21*   < > 24 24 22 25 26   GLUCOSE 126*   < > 123*   < > 154* 200*  68* 90 115*  BUN 7*   < > 9   < > 10 10 12 8  7*  CREATININE 0.65   < >  0.72   < > 0.64 0.51 0.59 0.42* 0.43*  CALCIUM 8.5*   < > 8.4*   < > 8.1* 8.2* 8.4* 8.1* 8.1*  MG 1.5*  --  2.1  --   --   --   --   --   --   AST 34  --  23  --  29  --  34  --   --   ALT 54*  --  36  --  34  --  33  --   --   ALKPHOS 140*  --  144*  --  135*  --  194*  --   --   BILITOT 1.6*  --  1.3*  --  0.9  --  1.4*  --   --    < > = values in this interval not displayed.   ------------------------------------------------------------------------------------------------------------------ No results for input(s): CHOL, HDL, LDLCALC, TRIG, CHOLHDL, LDLDIRECT in the last 72 hours.  No results found for: HGBA1C ------------------------------------------------------------------------------------------------------------------ No results for input(s): TSH, T4TOTAL, T3FREE, THYROIDAB in the last 72 hours.  Invalid input(s): FREET3 ------------------------------------------------------------------------------------------------------------------ No results for input(s): VITAMINB12, FOLATE, FERRITIN, TIBC, IRON, RETICCTPCT in the last 72 hours.  Coagulation profile Recent Labs  Lab 07/15/19 0426 07/15/19 1730  INR 1.3* 1.3*    No results for input(s): DDIMER in the last 72 hours.  Cardiac Enzymes No results for input(s): CKMB, TROPONINI, MYOGLOBIN in the last 168 hours.  Invalid input(s): CK ------------------------------------------------------------------------------------------------------------------ No results found for: BNP  Inpatient Medications  Scheduled Meds: . acetaminophen  1,000 mg Oral Q6H   Or  . acetaminophen (TYLENOL) oral liquid 160 mg/5 mL  1,000 mg Oral Q6H  . bisacodyl  10 mg Oral Daily  . docusate sodium  100 mg Oral BID  . enoxaparin (LOVENOX) injection  40 mg Subcutaneous Daily  . feeding supplement (ENSURE ENLIVE)  237 mL Oral BID BM  . latanoprost  1 drop Both Eyes QHS  .  loratadine  10 mg Oral Daily  . mouth rinse  15 mL Mouth Rinse BID  . mometasone-formoterol  2 puff Inhalation BID  . pneumococcal 23 valent vaccine  0.5 mL Intramuscular Tomorrow-1000  . polyethylene glycol  17 g Oral BID  . senna-docusate  1 tablet Oral QHS  . sodium chloride flush  3 mL Intravenous Q12H   Continuous Infusions: .  ceFAZolin (ANCEF) IV    . lactated ringers 10 mL/hr at 07/15/19 1206  . linezolid (ZYVOX) IV    . sodium chloride     PRN Meds:.acetaminophen **OR** [DISCONTINUED] acetaminophen, guaiFENesin-dextromethorphan, ipratropium-albuterol, levalbuterol, ondansetron **OR** ondansetron (ZOFRAN) IV, oxyCODONE, traMADol  Micro Results Recent Results (from the past 240 hour(s))  Culture, blood (Routine X 2) w Reflex to ID Panel     Status: None   Collection Time: 07/13/19  5:30 PM   Specimen: BLOOD RIGHT FOREARM  Result Value Ref Range Status   Specimen Description BLOOD RIGHT FOREARM  Final   Special Requests   Final    BOTTLES DRAWN AEROBIC AND ANAEROBIC Blood Culture adequate volume   Culture   Final    NO GROWTH 5 DAYS Performed at [facility], [address], [place], [place] [address]    Report Status 07/18/2019 FINAL  Final  Culture, blood (Routine X 2) w Reflex to ID Panel     Status: None   Collection Time: 07/13/19  5:30 PM   Specimen: BLOOD  Result Value Ref Range Status   Specimen Description  BLOOD LEFT ANTECUBITAL  Final   Special Requests   Final    BOTTLES DRAWN AEROBIC AND ANAEROBIC Blood Culture results may not be optimal due to an inadequate volume of blood received in culture bottles   Culture   Final    NO GROWTH 5 DAYS Performed at [facility], [address] [place], [place] [address]    Report Status 07/18/2019 FINAL  Final  SARS Coronavirus 2 by RT PCR (hospital order, performed in [facility] hospital lab) Nasopharyngeal Nasopharyngeal Swab     Status: None   Collection Time: 07/13/19  5:41 PM   Specimen:  Nasopharyngeal Swab  Result Value Ref Range Status   SARS Coronavirus 2 NEGATIVE NEGATIVE Final    Comment: (NOTE) SARS-CoV-2 target nucleic acids are NOT DETECTED. The SARS-CoV-2 RNA is generally detectable in upper and lower respiratory specimens during the acute phase of infection. The lowest concentration of SARS-CoV-2 viral copies this assay can detect is 250 copies / mL. A negative result does not preclude SARS-CoV-2 infection and should not be used as the sole basis for treatment or other patient management decisions.  A negative result may occur with improper specimen collection / handling, submission of specimen other than nasopharyngeal swab, presence of viral mutation(s) within the areas targeted by this assay, and inadequate number of viral copies (<250 copies / mL). A negative result must be combined with clinical observations, patient history, and epidemiological information. Fact Sheet for Patients:   BoilerBrush.com.cyhttps://www.fda.gov/media/136312/download Fact Sheet for Healthcare Providers: https://pope.com/https://www.fda.gov/media/136313/download This test is not yet approved or cleared  by the [place] FDA and has been authorized for detection and/or diagnosis of SARS-CoV-2 by FDA under an Emergency Use Authorization (EUA).  This EUA will remain in effect (meaning this test can be used) for the duration of the COVID-19 declaration under Section 564(b)(1) of the Act, 21 U.S.C. section 360bbb-3(b)(1), unless the authorization is terminated or revoked sooner. Performed at [facility], [address] [place], [place] [address]   MRSA PCR Screening     Status: None   Collection Time: 07/14/19  7:30 AM   Specimen: Nasal Mucosa; Nasopharyngeal  Result Value Ref Range Status   MRSA by PCR NEGATIVE NEGATIVE Final    Comment:        The GeneXpert MRSA Assay (FDA approved for NASAL specimens only), is one component of a comprehensive MRSA colonization surveillance program. It is  not intended to diagnose MRSA infection nor to guide or monitor treatment for MRSA infections. Performed at [facility], [address] [place], [place] [address]   Respiratory Panel by PCR     Status: None   Collection Time: 07/14/19  7:30 AM   Specimen: Nasal Mucosa; Respiratory  Result Value Ref Range Status   Adenovirus NOT DETECTED NOT DETECTED Final   Coronavirus 229E NOT DETECTED NOT DETECTED Final    Comment: (NOTE) The Coronavirus on the Respiratory Panel, DOES NOT test for the novel  Coronavirus (2019 nCoV)    Coronavirus HKU1 NOT DETECTED NOT DETECTED Final   Coronavirus NL63 NOT DETECTED NOT DETECTED Final   Coronavirus OC43 NOT DETECTED NOT DETECTED Final   Metapneumovirus NOT DETECTED NOT DETECTED Final   Rhinovirus / Enterovirus NOT DETECTED NOT DETECTED Final   Influenza A NOT DETECTED NOT DETECTED Final   Influenza B NOT DETECTED NOT DETECTED Final   Parainfluenza Virus 1 NOT DETECTED NOT DETECTED Final   Parainfluenza Virus 2 NOT DETECTED NOT DETECTED Final  Parainfluenza Virus 3 NOT DETECTED NOT DETECTED Final   Parainfluenza Virus 4 NOT DETECTED NOT DETECTED Final   Respiratory Syncytial Virus NOT DETECTED NOT DETECTED Final   Bordetella pertussis NOT DETECTED NOT DETECTED Final   Chlamydophila pneumoniae NOT DETECTED NOT DETECTED Final   Mycoplasma pneumoniae NOT DETECTED NOT DETECTED Final    Comment: Performed at [facility], [address] [place], [place] [address]  Gram stain     Status: None   Collection Time: 07/14/19  8:46 AM   Specimen: Lung, Left; Pleural Fluid  Result Value Ref Range Status   Specimen Description PLEURAL FLUID  Final   Special Requests LEFT LUNG  Final   Gram Stain   Final    WBC PRESENT,BOTH PMN AND MONONUCLEAR GRAM POSITIVE COCCI CYTOSPIN SMEAR Gram Stain Report Called to,Read Back By and Verified With: Elfredia Nevins RN [id number] 07/14/19 A BROWNING Performed at [facility], [address] [place],  [place] [address]    Report Status 07/14/2019 FINAL  Final  Culture, body fluid-bottle     Status: Abnormal   Collection Time: 07/14/19  8:46 AM   Specimen: Pleura  Result Value Ref Range Status   Specimen Description PLEURAL FLUID  Final   Special Requests LEFT LUNG  Final   Gram Stain   Final    IN BOTH AEROBIC AND ANAEROBIC BOTTLES GRAM POSITIVE COCCI IN CLUSTERS CRITICAL RESULT CALLED TO, READ BACK BY AND VERIFIED WITH: RN R SANTIAGO [phone] AT 908 AM BY CM    Culture (A)  Final    METHICILLIN RESISTANT STAPHYLOCOCCUS AUREUS CRITICAL RESULT CALLED TO, READ BACK BY AND VERIFIED WITH: RN R SANTIAGO [phone] AR 908 AM BY CM Performed at [facility], [address], [place], [place] [address]    Report Status 07/16/2019 FINAL  Final   Organism ID, Bacteria METHICILLIN RESISTANT STAPHYLOCOCCUS AUREUS  Final      Susceptibility   Methicillin resistant staphylococcus aureus - MIC*    CIPROFLOXACIN >=8 RESISTANT Resistant     ERYTHROMYCIN >=8 RESISTANT Resistant     GENTAMICIN <=0.5 SENSITIVE Sensitive     OXACILLIN >=4 RESISTANT Resistant     TETRACYCLINE <=1 SENSITIVE Sensitive     VANCOMYCIN 1 SENSITIVE Sensitive     TRIMETH/SULFA <=10 SENSITIVE Sensitive     CLINDAMYCIN <=0.25 SENSITIVE Sensitive     RIFAMPIN <=0.5 SENSITIVE Sensitive     Inducible Clindamycin NEGATIVE Sensitive     * METHICILLIN RESISTANT STAPHYLOCOCCUS AUREUS  Anaerobic culture     Status: None (Preliminary result)   Collection Time: 07/15/19  2:03 PM   Specimen: Pleural, Left; Body Fluid  Result Value Ref Range Status   Specimen Description FLUID LEFT PLEURAL  Final   Special Requests NONE  Final   Gram Stain   Final    ABUNDANT WBC PRESENT,BOTH PMN AND MONONUCLEAR NO ORGANISMS SEEN Performed at [facility], [address] [place], [place] [address]    Culture   Final    NO ANAEROBES ISOLATED; CULTURE IN PROGRESS FOR 5 DAYS   Report Status PENDING  Incomplete  Anaerobic culture     Status: None  (Preliminary result)   Collection Time: 07/15/19  2:03 PM   Specimen: Pleural, Left; Body Fluid  Result Value Ref Range Status   Specimen Description FLUID LEFT PLEURAL  Final   Special Requests PEEL  Final   Gram Stain   Final    ABUNDANT WBC PRESENT,BOTH PMN AND  MONONUCLEAR RARE GRAM POSITIVE COCCI Performed at [facility], [address], [place], [place] [address]    Culture   Final    NO ANAEROBES ISOLATED; CULTURE IN PROGRESS FOR 5 DAYS   Report Status PENDING  Incomplete  Body fluid culture     Status: None   Collection Time: 07/15/19  2:03 PM   Specimen: Pleural, Left; Body Fluid  Result Value Ref Range Status   Specimen Description FLUID LEFT PLEURAL  Final   Special Requests PEEL  Final   Gram Stain   Final    ABUNDANT WBC PRESENT,BOTH PMN AND MONONUCLEAR RARE GRAM POSITIVE COCCI Performed at [facility], [address] [place], [place] [address]    Culture FEW METHICILLIN RESISTANT STAPHYLOCOCCUS AUREUS  Final   Report Status 07/17/2019 FINAL  Final   Organism ID, Bacteria METHICILLIN RESISTANT STAPHYLOCOCCUS AUREUS  Final      Susceptibility   Methicillin resistant staphylococcus aureus - MIC*    CIPROFLOXACIN >=8 RESISTANT Resistant     ERYTHROMYCIN >=8 RESISTANT Resistant     GENTAMICIN <=0.5 SENSITIVE Sensitive     OXACILLIN >=4 RESISTANT Resistant     TETRACYCLINE <=1 SENSITIVE Sensitive     VANCOMYCIN 1 SENSITIVE Sensitive     TRIMETH/SULFA <=10 SENSITIVE Sensitive     CLINDAMYCIN <=0.25 SENSITIVE Sensitive     RIFAMPIN <=0.5 SENSITIVE Sensitive     Inducible Clindamycin NEGATIVE Sensitive     * FEW METHICILLIN RESISTANT STAPHYLOCOCCUS AUREUS  Body fluid culture     Status: Abnormal   Collection Time: 07/15/19  2:03 PM   Specimen: Fluid  Result Value Ref Range Status   Specimen Description FLUID LEFT PLEURAL  Final   Special Requests NONE  Final   Gram Stain   Final    ABUNDANT WBC PRESENT,BOTH PMN AND MONONUCLEAR NO ORGANISMS SEEN     Culture (A)  Final    METHICILLIN RESISTANT STAPHYLOCOCCUS AUREUS SUSCEPTIBILITIES PERFORMED ON PREVIOUS CULTURE WITHIN THE LAST 5 DAYS. Performed at [facility], [address], [place], [place] [address]    Report Status 07/18/2019 FINAL  Final   Organism ID, Bacteria METHICILLIN RESISTANT STAPHYLOCOCCUS AUREUS  Final      Susceptibility   Methicillin resistant staphylococcus aureus - MIC*    CIPROFLOXACIN >=8 RESISTANT Resistant     ERYTHROMYCIN >=8 RESISTANT Resistant     GENTAMICIN <=0.5 SENSITIVE Sensitive     OXACILLIN >=4 RESISTANT Resistant     TETRACYCLINE <=1 SENSITIVE Sensitive     VANCOMYCIN 1 SENSITIVE Sensitive     TRIMETH/SULFA <=10 SENSITIVE Sensitive     CLINDAMYCIN <=0.25 SENSITIVE Sensitive     RIFAMPIN <=0.5 SENSITIVE Sensitive     Inducible Clindamycin NEGATIVE Sensitive     * METHICILLIN RESISTANT STAPHYLOCOCCUS AUREUS    Radiology Reports DG Chest 1 View  Result Date: 07/14/2019 CLINICAL DATA:  Status post thoracentesis on the left EXAM: CHEST  1 VIEW COMPARISON:  07/13/2019 FINDINGS: Cardiac shadow is stable. Slight reduction in pleural fluid is noted when compare with the prior exam. No pneumothorax is seen. Slight increased density is noted throughout the left lung which may be related to some re-expansion edema. No bony abnormality is noted. IMPRESSION: No pneumothorax following thoracentesis. There are however changes suggestive of re-expansion edema. Electronically Signed   By: Alcide Clever M.D.   On: 07/14/2019 08:54   DG Chest 2 View  Result Date: 07/19/2019 CLINICAL DATA:  Pleural effusion on left EXAM: CHEST -  2 VIEW COMPARISON:  07/18/2019 FINDINGS: Interval removal of left chest tubes. No pneumothorax. Otherwise stable appearance of the left hemithorax. No confluent opacity on the right. Heart is normal size. IMPRESSION: Interval removal of left chest tubes without pneumothorax. Otherwise no change. Electronically Signed   By: Rolm Baptise M.D.    On: 07/19/2019 07:37   DG Chest 2 View  Result Date: 07/13/2019 CLINICAL DATA:  Chest pain, shortness of breath. EXAM: CHEST - 2 VIEW COMPARISON:  October 21, 2014. FINDINGS: The heart size and mediastinal contours are within normal limits. No pneumothorax is noted. Right lung is clear. Mild left pleural effusion is noted with associated left basilar infiltrate or atelectasis. The visualized skeletal structures are unremarkable. IMPRESSION: Mild left pleural effusion is noted with associated left basilar infiltrate or atelectasis. Electronically Signed   By: Marijo Conception M.D.   On: 07/13/2019 16:47   CT CHEST WO CONTRAST  Result Date: 07/14/2019 CLINICAL DATA:  And pain in a status post thoracentesis EXAM: CT CHEST WITHOUT CONTRAST TECHNIQUE: Multidetector CT imaging of the chest was performed following the standard protocol without IV contrast. COMPARISON:  07/13/2019 FINDINGS: Cardiovascular: Heart is unremarkable without pericardial effusion. Grossly normal caliber of the thoracic aorta. Evaluation limited without intravenous contrast. Mediastinum/Nodes: Evaluation is limited without IV contrast. The thyroid, trachea, and esophagus are grossly unremarkable. No discrete adenopathy. Lungs/Pleura: Since the prior CT, there is been significant increase in the size of the multilocular complex left pleural effusion. There is progressive consolidation throughout the left lung, with relative sparing of the left apex. No left-sided pneumothorax after thoracentesis. There is a small free-flowing right pleural effusion. Patchy consolidation right lower lobe likely atelectasis. The central airways are patent. Upper Abdomen: No acute abnormality. Musculoskeletal: No acute or destructive bony lesions. Reconstructed images demonstrate no additional findings. IMPRESSION: 1. Enlarging multilocular complex left pleural effusion, compatible with empyema. 2. Progressive left lung consolidation with relative sparing of the  left apex. This could reflect underlying pneumonia. 3. Trace free-flowing right pleural effusion, new since prior study. Electronically Signed   By: Randa Ngo M.D.   On: 07/14/2019 20:08   CT Angio Chest PE W and/or Wo Contrast  Result Date: 07/13/2019 CLINICAL DATA:  Shortness of breath with left-sided chest pain. EXAM: CT ANGIOGRAPHY CHEST WITH CONTRAST TECHNIQUE: Multidetector CT imaging of the chest was performed using the standard protocol during bolus administration of intravenous contrast. Multiplanar CT image reconstructions and MIPs were obtained to evaluate the vascular anatomy. CONTRAST:  89mL OMNIPAQUE IOHEXOL 350 MG/ML SOLN COMPARISON:  None. FINDINGS: Cardiovascular: Heart size normal. Small pericardial effusion evident. No thoracic aortic aneurysm. No filling defects within the opacified pulmonary arteries to suggest the presence of an acute pulmonary embolus Mediastinum/Nodes: 11 mm short axis AP window lymph node visible on image 44/5 nodular soft tissue is identified in the anterior mediastinum, potentially related to small clustered lymph nodes. No right hilar lymphadenopathy. There is abnormal soft tissue attenuation in the left hilum. There is no axillary lymphadenopathy. Lungs/Pleura: Ground-glass opacity is seen fairly diffusely in the left upper lobe with diffuse consolidation of the lingula with airspace consolidation tracking centrally into the left hilum. There is left lower lobe collapse inferiorly. These changes are associated with a moderate left pleural effusion demonstrating loculation in the apex and anterior upper hemithorax. Nodular pleural thickening or loculated fluid is seen along the medial pleura adjacent to the transverse aorta. No right pleural effusion. Upper Abdomen: Unremarkable. Musculoskeletal: No worrisome lytic or sclerotic  osseous abnormality. Review of the MIP images confirms the above findings. IMPRESSION: 1. No CT evidence for acute pulmonary embolus. 2.  Diffuse ground-glass opacity in the left upper lobe with diffuse consolidation of the lingula tracking centrally into the left hilum. This is associated with a moderate left pleural effusion demonstrating loculation and prominent areas of left lower lobe atelectasis. Nodular pleural thickening or loculated fluid is identified along the medial pleura adjacent to the transverse aorta. Mildly enlarged AP window lymph node with question of soft tissue nodularity/clustered lymph nodes in the anterior mediastinum. These changes may all be related to pneumonia, given the apparent pleural nodularity towards the apex, neoplasm is a distinct concern. Close follow-up recommended. Electronically Signed   By: Kennith Center M.D.   On: 07/13/2019 20:44   DG CHEST PORT 1 VIEW  Result Date: 07/18/2019 CLINICAL DATA:  Evaluate empyema EXAM: PORTABLE CHEST 1 VIEW COMPARISON:  07/17/2019 FINDINGS: Cardiac shadow is stable. Two chest tubes are again seen on the left. The overall appearance of the left hemithorax is stable. No pneumothorax is noted. Diffuse opacification throughout the lung is seen with multiple air bronchograms as well as persistent empyema. Right lung remains clear. Bony structures are unremarkable. IMPRESSION: Stable appearance of the chest when compared with the previous day. Electronically Signed   By: Alcide Clever M.D.   On: 07/18/2019 08:46   DG CHEST PORT 1 VIEW  Result Date: 07/17/2019 CLINICAL DATA:  Follow-up empyema EXAM: PORTABLE CHEST 1 VIEW COMPARISON:  07/16/2019 FINDINGS: Cardiac shadow is stable. Diffuse infiltrate is noted throughout the left lung. Two left thoracostomy catheters are noted in place. Some persistent pleural fluid is noted consistent with the given clinical history. No pneumothorax is seen. Right lung remains clear. IMPRESSION: Stable appearance of the chest when compare with the prior exam. Electronically Signed   By: Alcide Clever M.D.   On: 07/17/2019 09:59   DG CHEST PORT 1  VIEW  Result Date: 07/16/2019 CLINICAL DATA:  Status post VATS and decortication on the left on 07/15/2019. EXAM: PORTABLE CHEST 1 VIEW COMPARISON:  Yesterday. FINDINGS: Two left chest tubes remain in place. No pneumothorax. Diffuse airspace opacity in the left lung with mild improvement. Possible minimal lateral and apical pleural fluid or thickening on the left. Interval minimal patchy opacity in the right lower lung zone. Stable mild peribronchial thickening on the right. Grossly normal sized heart. Decreased subcutaneous air on the left. Unremarkable bones. Gas distended bowel in the left upper abdomen. IMPRESSION: 1. No pneumothorax. 2. Diffuse left lung pneumonia and/or pulmonary edema, mildly improved. 3. Interval minimal patchy atelectasis or pneumonia in the right lower lung zone. 4. Decreased subcutaneous air on the left. Electronically Signed   By: Beckie Salts M.D.   On: 07/16/2019 08:51   DG Chest Port 1 View  Result Date: 07/15/2019 CLINICAL DATA:  Post lung surgery follow-up. EXAM: PORTABLE CHEST 1 VIEW COMPARISON:  07/15/2019 FINDINGS: Examination demonstrates adequate lung volumes with 2 left-sided chest tubes present. Mild opacification throughout the left lung with improved aeration compared to the prior exam. Right lung is clear. Cardiomediastinal silhouette is within normal. Mild subcutaneous emphysema over the lower left flank. Remainder the exam is unchanged. IMPRESSION: Hazy opacification over the left lung with interval improved aeration post placement 2 left-sided chest tubes. Electronically Signed   By: Elberta Fortis M.D.   On: 07/15/2019 14:50   DG Chest Port 1 View  Result Date: 07/15/2019 CLINICAL DATA:  Empyema. EXAM: PORTABLE CHEST 1  VIEW COMPARISON:  CT 07/14/2019.  Chest x-ray 07/14/2019. FINDINGS: Complete opacification left hemithorax is noted on today's exam. This is most consistent with reaccumulation of left pleural fluid/progressive empyema. Underlying pulmonary  infiltrate cannot be excluded. No pneumothorax. Heart size cannot be evaluated no acute bony abnormality. IMPRESSION: Complete opacification left hemithorax is noted on today's exam. This is most consistent with reaccumulation of left pleural fluid/progressive empyema. Electronically Signed   By: Maisie Fus  Register   On: 07/15/2019 08:40   ECHOCARDIOGRAM COMPLETE  Result Date: 07/15/2019    ECHOCARDIOGRAM REPORT   Patient Name:   Sally Buck Date of Exam: 07/15/2019 Medical Rec #:  [id number]              Height:       57.0 in Accession #:    [id number]             Weight:       95.8 lb Date of Birth:  13-Jan-1961               BSA:          1.315 m Patient Age:    59 years               BP:           143/90 mmHg Patient Gender: F                      HR:           120 bpm. Exam Location:  Inpatient Procedure: 2D Echo Indications:    Elevated Troponin  History:        Patient has no prior history of Echocardiogram examinations.                 Signs/Symptoms:Shortness of Breath. GERD. Shortness of breath                 with associated chest and shoulder pain, fever. Sepsis secondary                 to pneumonia, with loculated empyema.  Sonographer:    Leta Jungling RDCS Referring Phys: [id number] ANASTASSIA DOUTOVA IMPRESSIONS  1. Left ventricular ejection fraction, by estimation, is 55%. The left ventricle has normal function. The left ventricle has no regional wall motion abnormalities. Left ventricular diastolic parameters are indeterminate.  2. Right ventricular systolic function is normal. The right ventricular size is normal. Tricuspid regurgitation signal is inadequate for assessing PA pressure.  3. The mitral valve is normal in structure. Trivial mitral valve regurgitation. No evidence of mitral stenosis.  4. The aortic valve is normal in structure. Aortic valve regurgitation is not visualized. No aortic stenosis is present.  5. Dilated pulmonary artery.  6. The inferior vena cava is normal in size  with greater than 50% respiratory variability, suggesting right atrial pressure of 3 mmHg. FINDINGS  Left Ventricle: Left ventricular ejection fraction, by estimation, is 55%. The left ventricle has normal function. The left ventricle has no regional wall motion abnormalities. The left ventricular internal cavity size was normal in size. There is no left ventricular hypertrophy. Left ventricular diastolic parameters are indeterminate. Right Ventricle: The right ventricular size is normal. No increase in right ventricular wall thickness. Right ventricular systolic function is normal. Tricuspid regurgitation signal is inadequate for assessing PA pressure. Left Atrium: Left atrial size was normal in size. Right Atrium: Right atrial size was normal in size. Pericardium: There is no evidence  of pericardial effusion. Mitral Valve: The mitral valve is normal in structure. Normal mobility of the mitral valve leaflets. Trivial mitral valve regurgitation. No evidence of mitral valve stenosis. Tricuspid Valve: The tricuspid valve is normal in structure. Tricuspid valve regurgitation is not demonstrated. No evidence of tricuspid stenosis. Aortic Valve: The aortic valve is normal in structure. Aortic valve regurgitation is not visualized. No aortic stenosis is present. Pulmonic Valve: The pulmonic valve was normal in structure. Pulmonic valve regurgitation is not visualized. No evidence of pulmonic stenosis. Aorta: The aortic root is normal in size and structure. Pulmonary Artery: The pulmonary artery is dilated. Venous: The inferior vena cava is normal in size with greater than 50% respiratory variability, suggesting right atrial pressure of 3 mmHg. IAS/Shunts: There is redundancy of the interatrial septum. No atrial level shunt detected by color flow Doppler.  LEFT VENTRICLE PLAX 2D LVIDd:         3.70 cm LVIDs:         2.70 cm LV PW:         0.70 cm LV IVS:        0.90 cm LVOT diam:     1.90 cm LV SV:         34 LV SV Index:    26 LVOT Area:     2.84 cm  LV Volumes (MOD) LV vol d, MOD A2C: 73.8 ml LV vol d, MOD A4C: 71.2 ml LV vol s, MOD A2C: 34.9 ml LV vol s, MOD A4C: 36.1 ml LV SV MOD A2C:     38.9 ml LV SV MOD A4C:     71.2 ml LV SV MOD BP:      37.4 ml RIGHT VENTRICLE RV S prime:     17.70 cm/s TAPSE (M-mode): 1.9 cm LEFT ATRIUM             Index       RIGHT ATRIUM          Index LA diam:        3.00 cm 2.28 cm/m  RA Area:     7.41 cm LA Vol (A2C):   26.7 ml 20.30 ml/m RA Volume:   12.50 ml 9.50 ml/m LA Vol (A4C):   23.3 ml 17.71 ml/m LA Biplane Vol: 27.2 ml 20.68 ml/m  AORTIC VALVE LVOT Vmax:   89.50 cm/s LVOT Vmean:  61.600 cm/s LVOT VTI:    0.120 m  AORTA Ao Root diam: 2.90 cm Ao Asc diam:  2.70 cm  SHUNTS Systemic VTI:  0.12 m Systemic Diam: 1.90 cm Weston Brass MD Electronically signed by Weston Brass MD Signature Date/Time: 07/15/2019/12:54:26 PM    Final    VAS [place] LOWER EXTREMITY VENOUS (DVT)  Result Date: 07/15/2019  Lower Venous DVTStudy Indications: Elevated ddimer.  Comparison Study: no prior Performing Technologist: Blanch Media RVS  Examination Guidelines: A complete evaluation includes B-mode imaging, spectral Doppler, color Doppler, and power Doppler as needed of all accessible portions of each vessel. Bilateral testing is considered an integral part of a complete examination. Limited examinations for reoccurring indications may be performed as noted. The reflux portion of the exam is performed with the patient in reverse Trendelenburg.  +---------+---------------+---------+-----------+----------+--------------+ RIGHT    CompressibilityPhasicitySpontaneityPropertiesThrombus Aging +---------+---------------+---------+-----------+----------+--------------+ CFV      Full           Yes      Yes                                 +---------+---------------+---------+-----------+----------+--------------+  SFJ      Full                                                         +---------+---------------+---------+-----------+----------+--------------+ FV Prox  Full                                                        +---------+---------------+---------+-----------+----------+--------------+ FV Mid   Full                                                        +---------+---------------+---------+-----------+----------+--------------+ FV DistalFull                                                        +---------+---------------+---------+-----------+----------+--------------+ PFV      Full                                                        +---------+---------------+---------+-----------+----------+--------------+ POP      Full           Yes      Yes                                 +---------+---------------+---------+-----------+----------+--------------+ PTV      Full                                                        +---------+---------------+---------+-----------+----------+--------------+ PERO     Full                                                        +---------+---------------+---------+-----------+----------+--------------+   +---------+---------------+---------+-----------+----------+--------------+ LEFT     CompressibilityPhasicitySpontaneityPropertiesThrombus Aging +---------+---------------+---------+-----------+----------+--------------+ CFV      Full           Yes      Yes                                 +---------+---------------+---------+-----------+----------+--------------+ SFJ      Full                                                        +---------+---------------+---------+-----------+----------+--------------+  FV Prox  Full                                                        +---------+---------------+---------+-----------+----------+--------------+ FV Mid   Full                                                         +---------+---------------+---------+-----------+----------+--------------+ FV DistalFull                                                        +---------+---------------+---------+-----------+----------+--------------+ PFV      Full                                                        +---------+---------------+---------+-----------+----------+--------------+ POP      Full           Yes      Yes                                 +---------+---------------+---------+-----------+----------+--------------+ PTV      Full                                                        +---------+---------------+---------+-----------+----------+--------------+ PERO     Full                                                        +---------+---------------+---------+-----------+----------+--------------+     Summary: BILATERAL: - No evidence of deep vein thrombosis seen in the lower extremities, bilaterally. - RIGHT: - A cystic structure is found in the popliteal fossa.  LEFT: - No cystic structure found in the popliteal fossa.  *See table(s) above for measurements and observations. Electronically signed by Sherald Hess MD on 07/15/2019 at 5:26:31 PM.    Final    IR THORACENTESIS ASP PLEURAL SPACE W/IMG GUIDE  Result Date: 07/14/2019 INDICATION: Patient with history of recurrent pneumonia, presents with shortness of breath and left pleural effusion. Request is made for diagnostic and therapeutic left thoracentesis. EXAM: ULTRASOUND GUIDED DIAGNOSTIC AND THERAPEUTIC LEFT THORACENTESIS MEDICATIONS: 10 mL 1% lidocaine COMPLICATIONS: SIR Level A - No therapy, no consequence. PROCEDURE: An ultrasound guided thoracentesis was thoroughly discussed with the patient and questions answered. The benefits, risks, alternatives and complications were also discussed. The patient understands and wishes to proceed with the procedure. Written consent was obtained. Ultrasound was performed to localize and  mark an adequate pocket of  fluid in the left chest. The area was then prepped and draped in the normal sterile fashion. 1% Lidocaine was used for local anesthesia. Under ultrasound guidance a 6 Fr Safe-T-Centesis catheter was introduced. Thoracentesis was performed. The catheter was removed and a dressing applied. FINDINGS: A total of approximately 250 mL of cloudy, purulent-appearing fluid was removed. Samples were sent to the laboratory as requested by the clinical team. Postprocedure chest x-ray shows show suggestion of re-expansion edema. IMPRESSION: Successful ultrasound guided diagnostic and therapeutic thoracentesis yielding 250 mL of pleural fluid. Read by: Loyce Dys PA-C Electronically Signed   By: Gilmer Mor D.O.   On: 07/14/2019 11:36     Huey Bienenstock M.D on 07/19/2019 at 4:50 PM  Between 7am to 7pm - Pager - [phone]  [facility] -  Office  [phone]

## 2019-07-19 NOTE — Progress Notes (Addendum)
D/c PCA pum fentanyl per order. Wasted 63ml fentanyl with Rosey Bath, Charity fundraiser. Pt tolerated well.   Lawson Radar, RN

## 2019-07-19 NOTE — Progress Notes (Signed)
Pharmacy Antibiotic Note  Sally Buck is a 59 y.o. female admitted on 07/13/2019 with PNA and fevers.  Continues on Vancomycin alone.  S/p VATs - > pleural fluid culture growing MRSA Scr stable, afebrile   Plan: Continue Vancomycin 500 mg iv Q 12 hours LOT? If vancomycin continues much longer will evaluate trough levels.  Height: 4\' 9"  (144.8 cm) Weight: 48.7 kg (107 lb 5.8 oz) IBW/kg (Calculated) : 38.6  Temp (24hrs), Avg:98.1 F (36.7 C), Min:97.7 F (36.5 C), Max:98.8 F (37.1 C)  Recent Labs  Lab 07/13/19 1730 07/14/19 0517 07/15/19 1730 07/16/19 0326 07/17/19 0343 07/18/19 0417 07/19/19 0322  WBC  --    < > 37.6* 35.4* 25.7* 18.7* 18.6*  CREATININE  --    < > 0.64 0.51 0.59 0.42* 0.43*  LATICACIDVEN 1.2  --   --   --   --   --   --    < > = values in this interval not displayed.    Estimated Creatinine Clearance: 49.7 mL/min (A) (by C-G formula based on SCr of 0.43 mg/dL (L)).    Allergies  Allergen Reactions  . Ampicillin Itching  . Ceclor [Cefaclor] Other (See Comments)    Passed out   . Other Other (See Comments)    travoprost : unknown  . Sulfa Antibiotics   . Tape Itching  . Tetracyclines & Related Itching  . Tylenol With Codeine #3 [Acetaminophen-Codeine] Other (See Comments)    Passed out     Vancomycin 5/20>> Cefepime 5/20>>5/22 Metronidazole 5/20>>5/22  5/20 pleural fluid - MRSA (s-vanc) 5/19 pleural fluid - > MRSA (s-vanc) 5/18 BC x 2 - > Neg  6/18, Reece Leader, BCPS, [facility] Clinical Pharmacist  07/19/2019 9:10 AM   [facility] pharmacy phone numbers are listed on [email or web address]

## 2019-07-19 NOTE — Progress Notes (Signed)
      [address]       [place] [address]             [phone]      4 Days Post-Op Procedure(s) (LRB): VIDEO ASSISTED THORACOSCOPY (VATS)/DECORTICATION and Drainage of Empyema (Left)   Subjective:  No new complaints.   Objective: Vital signs in last 24 hours: Temp:  [97.7 F (36.5 C)-98.8 F (37.1 C)] 97.7 F (36.5 C) (05/24 0700) Pulse Rate:  [98-122] 102 (05/24 0700) Cardiac Rhythm: Sinus tachycardia;Heart block (05/24 0736) Resp:  [19-33] 32 (05/24 0700) BP: (101-148)/(64-90) 143/64 (05/24 0700) SpO2:  [92 %-100 %] 94 % (05/24 0700) FiO2 (%):  [28 %] 28 % (05/23 0901) Weight:  [48.7 kg] 48.7 kg (05/24 0500)  Intake/Output from previous day: 05/23 0701 - 05/24 0700 In: 550 [P.O.:250; IV Piggyback:300] Out: -   General appearance: alert, cooperative and no distress Heart: regular rate and rhythm Lungs: diminished breath sounds on left Abdomen: soft, non-tender; bowel sounds normal; no masses,  no organomegaly Extremities: extremities normal, atraumatic, no cyanosis or edema Wound: clean and dry  Lab Results: Recent Labs    07/18/19 0417 07/19/19 0322  WBC 18.7* 18.6*  HGB 12.9 11.4*  HCT 39.3 34.9*  PLT 417* 447*   BMET:  Recent Labs    07/18/19 0417 07/19/19 0322  NA 134* 135  K 3.6 3.4*  CL 98 97*  CO2 25 26  GLUCOSE 90 115*  BUN 8 7*  CREATININE 0.42* 0.43*  CALCIUM 8.1* 8.1*    PT/INR: No results for input(s): LABPROT, INR in the last 72 hours. ABG    Component Value Date/Time   PHART 7.350 07/15/2019 1645   HCO3 24.0 07/15/2019 1645   ACIDBASEDEF 0.9 07/15/2019 1645   O2SAT 87.4 07/15/2019 1645   CBG (last 3)  Recent Labs    07/17/19 0800  GLUCAP 88    Assessment/Plan: S/P Procedure(s) (LRB): VIDEO ASSISTED THORACOSCOPY (VATS)/DECORTICATION and Drainage of Empyema (Left)  1. Pulm- CT removed yesterday, post empyema changes as expected, no pneumothorax 2. Dispo- patient stable, d/c PCA.. I have sent a  prescription for pain medication to her pharmacy, we will sign off.. patient follow up will be placed in chart   LOS: 6 days    Lowella Dandy, PA-C  07/19/2019

## 2019-07-19 NOTE — Progress Notes (Signed)
Physical Therapy Treatment Patient Details Name: Sally Buck MRN: [id number] DOB: 1961/01/08 Today's Date: 07/19/2019    History of Present Illness Patient is a 59 year old female admitted with cough that has progressively been getting worse.  Pt dx with left empyema s/p VATs 5/20.PMhx: asthma    PT Comments    Pt very pleasant and reports she is eager to be able to return home. Working on IS on arrival. Pt with improved gait tolerance without need for RW this session with continued drop in SpO2 to 88% with activity and requires cues and rests to achieve >90%. Pt educated for bil LE HEP and encouraged continued mobility. Pt talking with unit director end of session.    Follow Up Recommendations  No PT follow up     Equipment Recommendations  None recommended by PT    Recommendations for Other Services       Precautions / Restrictions Precautions Precautions: None Restrictions Weight Bearing Restrictions: No    Mobility  Bed Mobility Overal bed mobility: Needs Assistance         Sit to supine: Supervision   General bed mobility comments: pt able to transition from supine<>sit at left side of bed with management for lines only  Transfers Overall transfer level: Needs assistance     Sit to Stand: Supervision         General transfer comment: supervision for lines  Ambulation/Gait Ambulation/Gait assistance: Supervision Gait Distance (Feet): 240 Feet Assistive device: None Gait Pattern/deviations: Step-through pattern;Decreased stride length   Gait velocity interpretation: 1.31 - 2.62 ft/sec, indicative of limited community ambulator General Gait Details: pt with good stability without use of RW this session with decreased speed and supervision for lines, limited by fatigue with SpO2 88-93% on RA with gait   Stairs             Wheelchair Mobility    Modified Rankin (Stroke Patients Only)       Balance Overall balance assessment: No  apparent balance deficits (not formally assessed)                                          Cognition Arousal/Alertness: Awake/alert Behavior During Therapy: WFL for tasks assessed/performed Overall Cognitive Status: Within Functional Limits for tasks assessed                                        Exercises General Exercises - Lower Extremity Heel Slides: AROM;Both;Supine;15 reps Hip ABduction/ADduction: AROM;Both;Supine;15 reps Straight Leg Raises: AROM;Both;Supine;10 reps    General Comments        Pertinent Vitals/Pain Pain Score: 3  Pain Location: L side chest Pain Descriptors / Indicators: Sore Pain Intervention(s): Limited activity within patient's tolerance;Monitored during session    Home Living                      Prior Function            PT Goals (current goals can now be found in the care plan section) Progress towards PT goals: Progressing toward goals    Frequency    Min 3X/week      PT Plan Current plan remains appropriate    Co-evaluation  AM-PAC PT "6 Clicks" Mobility   Outcome Measure  Help needed turning from your back to your side while in a flat bed without using bedrails?: None Help needed moving from lying on your back to sitting on the side of a flat bed without using bedrails?: None Help needed moving to and from a bed to a chair (including a wheelchair)?: A Little Help needed standing up from a chair using your arms (e.g., wheelchair or bedside chair)?: None Help needed to walk in hospital room?: A Little Help needed climbing 3-5 steps with a railing? : A Little 6 Click Score: 21    End of Session   Activity Tolerance: Patient tolerated treatment well Patient left: in bed;with call bell/phone within reach;with nursing/sitter in room Nurse Communication: Mobility status PT Visit Diagnosis: Other abnormalities of gait and mobility (R26.89);Difficulty in walking, not  elsewhere classified (R26.2)     Time: 1700-1749 PT Time Calculation (min) (ACUTE ONLY): 16 min  Charges:  $Gait Training: 8-22 mins                     Bayard Males, PT Acute Rehabilitation Services Pager: [phone] Office: [place] 07/19/2019, 12:30 PM

## 2019-07-20 DIAGNOSIS — J869 Pyothorax without fistula: Secondary | ICD-10-CM | POA: Diagnosis not present

## 2019-07-20 DIAGNOSIS — R000 Tachycardia, unspecified: Secondary | ICD-10-CM | POA: Diagnosis not present

## 2019-07-20 LAB — ANAEROBIC CULTURE

## 2019-07-20 LAB — BASIC METABOLIC PANEL
Anion gap: 9 (ref 5–15)
BUN: <5 mg/dL — ABNORMAL LOW (ref 8–23)
CO2: 28 mmol/L (ref 22–32)
Calcium: 8.4 mg/dL — ABNORMAL LOW (ref 8.9–10.3)
Chloride: 97 mmol/L — ABNORMAL LOW (ref 98–111)
Creatinine, Ser: 0.48 mg/dL (ref 0.44–1.00)
GFR calc Af Amer: >60 mL/min (ref >60)
GFR calc non Af Amer: >60 mL/min (ref >60)
Glucose, Bld: 138 mg/dL — ABNORMAL HIGH (ref 70–99)
Potassium: 3.7 mmol/L (ref 3.5–5.1)
Sodium: 134 mmol/L — ABNORMAL LOW (ref 135–145)

## 2019-07-20 LAB — CBC
HCT: 37.9 % (ref 36.0–46.0)
Hemoglobin: 12.5 g/dL (ref 12.0–15.0)
MCH: 29.3 pg (ref 26.0–34.0)
MCHC: 33 g/dL (ref 30.0–36.0)
MCV: 89 fL (ref 80.0–100.0)
Platelets: 668 10*3/uL — ABNORMAL HIGH (ref 150–400)
RBC: 4.26 MIL/uL (ref 3.87–5.11)
RDW: 12 % (ref 11.5–15.5)
WBC: 16.5 10*3/uL — ABNORMAL HIGH (ref 4.0–10.5)
nRBC: 0 % (ref 0.0–0.2)

## 2019-07-20 NOTE — Progress Notes (Signed)
Occupational Therapy Treatment Patient Details Name: Sally Buck MRN: [id number] DOB: 03/16/1960 Today's Date: 07/20/2019    History of present illness Patient is a 59 year old female admitted with cough that has progressively been getting worse.  Pt dx with left empyema s/p VATs 5/20.PMhx: asthma   OT comments  Pr progressing to OOB ADL and mobility in room. Pt performing pericare at [facility] in standing with minA overall; pt standing at sink for light grooming tasks with supervisionA.   Pt ambulating 125' in room with no AD and supervisionA for IV pole. 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 Pt stating "I feel hot" after BM and standing at sink; pt sat down and BP 143/88 (105) 122 BPM , O2 >90% on RA. RN in room and aware. Pt given cold rag pt returned to feeling better. Pt would benefit from continued OT acutely and post acutely. Pt reports to have family members in town to assist. OT following acutely.    Follow Up Recommendations  Home health OT    Equipment Recommendations  None recommended by OT    Recommendations for Other Services      Precautions / Restrictions Precautions Precautions: None Precaution Comments: watch sats Restrictions Weight Bearing Restrictions: No       Mobility Bed Mobility Overal bed mobility: Needs Assistance Bed Mobility: Sit to Supine       Sit to supine: Supervision   General bed mobility comments: supervisionA  Transfers Overall transfer level: Needs assistance Equipment used: None Transfers: Sit to/from Stand Sit to Stand: Supervision         General transfer comment: supervision for lines    Balance Overall balance assessment: No apparent balance deficits (not formally assessed)                             High Level Balance Comments: no LOB episodes           ADL either performed or assessed with clinical judgement   ADL Overall ADL's : Needs assistance/impaired                         Toilet Transfer: Min  Systems developer Details (indicate cue type and reason): very little physical assist provided. Toileting- Clothing Manipulation and Hygiene: Minimal assistance;Sit to/from stand Toileting - Clothing Manipulation Details (indicate cue type and reason): Pt able to perform anterior pericare, but requires assist for posterior pericare.      Functional mobility during ADLs: Min guard General ADL Comments: Pt performing pericare at [facility] in standing with minA overall; pt standing at sink for light grooming tasks with supervisionA. pt keeping eyes closed most of session and taking slow, cautious steps.  Pt ambulating 125' in room with no AD and supervisionA for IV pole. 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     Vision   Vision Assessment?: No apparent visual deficits   Perception     Praxis      Cognition Arousal/Alertness: Awake/alert Behavior During Therapy: WFL for tasks assessed/performed Overall Cognitive Status: Within Functional Limits for tasks assessed                                          Exercises     Shoulder Instructions       General Comments Pt stating "I feel hot" after BM and standing at  sink; pt sat down and BP 143/88 (105) 122 BPM , O2 >90% on RA. RN in room and aware. Pt given cold rag and slow breathing assisted pt back to feeling up to mobility.    Pertinent Vitals/ Pain       Pain Assessment: Faces Faces Pain Scale: Hurts little more Pain Location: L side chest Pain Descriptors / Indicators: Sore Pain Intervention(s): Monitored during session  Home Living                                          Prior Functioning/Environment              Frequency  Min 2X/week        Progress Toward Goals  OT Goals(current goals can now be found in the care plan section)  Progress towards OT goals: Progressing toward goals  Acute Rehab OT Goals Patient Stated Goal: be able to return to work OT Goal Formulation: With  patient Time For Goal Achievement: 07/29/19 Potential to Achieve Goals: Good ADL Goals Pt Will Perform Grooming: Independently;standing;sitting Pt Will Perform Upper Body Dressing: Independently;sitting Pt Will Perform Lower Body Dressing: Independently;sit to/from stand;sitting/lateral leans Pt Will Transfer to Toilet: Independently;ambulating Pt Will Perform Toileting - Clothing Manipulation and hygiene: Independently;sitting/lateral leans;sit to/from stand Additional ADL Goal #1: Patient will identify 3 energy conservation techniques to implement at home in order to maximize safety and independence with self care.  Plan Discharge plan remains appropriate    Co-evaluation                 AM-PAC OT "6 Clicks" Daily Activity     Outcome Measure   Help from another person eating meals?: None Help from another person taking care of personal grooming?: A Little Help from another person toileting, which includes using toliet, bedpan, or urinal?: A Little Help from another person bathing (including washing, rinsing, drying)?: A Little Help from another person to put on and taking off regular upper body clothing?: A Little Help from another person to put on and taking off regular lower body clothing?: A Little 6 Click Score: 19    End of Session Equipment Utilized During Treatment: Gait belt  OT Visit Diagnosis: Other abnormalities of gait and mobility (R26.89);Muscle weakness (generalized) (M62.81)   Activity Tolerance Patient limited by pain   Patient Left in bed;with call bell/phone within reach   Nurse Communication Mobility status        Time: 1208-1239 OT Time Calculation (min): 31 min  Charges: OT General Charges $OT Visit: 1 Visit OT Treatments $Self Care/Home Management : 8-22 mins $Therapeutic Activity: 8-22 mins  Jefferey Pica, OTR/L Acute Rehabilitation Services Pager: [phone] Office: [phone]     Dayquan Buys C 07/20/2019, 4:19 PM

## 2019-07-20 NOTE — Progress Notes (Signed)
PROGRESS NOTE                                                                                                                                                                                                             Patient Demographics:    Sally Buck, is a 59 y.o. female, DOB - 1960/05/27, KGU:[id number]  Admit date - 07/13/2019   Admitting Physician Therisa Doyne, MD  Outpatient Primary MD for the patient is Marva Panda, NP  LOS - 7   Chief Complaint  Patient presents with  . Allergic Reaction       Brief Narrative    59 yo female with past medical history of recurrent pneumonia, GERD, and Hyperlipidemia who presented with a chief complaint of shortness of breath with associated chest and shoulder pain, progressive shortness of breath, fever and chills, patient reports she has had multiple episodes of pneumonia in the past ,On admission patient was seen febrile, tachypneic, tachycardic,Chest CT was then obtained that revealed significant for loculated pleural effusion vs empyema. IR was consulted and preformed US guided left thoracentesis that yielded of cloudy thick purulent-appearing fluid. Post thoracentesis patient continues to remain febrile with worsening leukocytosis, increased work of breathing, PCCM were consulted, recommendation for CT surgery evaluation and surgery.    Subjective:    [name] today does report some cough, he is off PCA pump since yesterday, CT chest was discontinued yesterday, reports pain has improved, as well her appetite has improved as well .   Assessment  & Plan :    Active Problems:   CAP (community acquired pneumonia)   Empyema (HCC)   Tachycardia   Sepsis (HCC)   Dehydration   Hyponatremia  Acute hypoxic resp failute and Sepsis secondary to pneumonia, with loculated empyema. -Sepsis present on admission, leukocytosis, tachypneic, tachycardic and hypoxia with elevated  procalcitonin. -Blood cultures no growth to date, postthoracentesis to 50 cc of cloudy thick purulent drainage, wit pleural effusion growing MRSA . -Patient with worsening sepsis, leukocytosis and increased work of breathing secondary to her to loculated empyema, went for VATS, and empyema evacuation by CT surgery 5/20 . -Currently on broad-spectrum antibiotic vancomycin, cefepime and Flagyl, initially up for effusion growing MRSA, vancomycin has been transitioned to IV linezolid for now continue with IV given she still having significant leukocytosis,, have discussed with ID,  Dr. Algis Liming, recommendation of total of 2 weeks of antibiotic treatment, can continue with IV regimen during hospital stay, and upon discharge she can be discharged on oral doxycycline. -Management of chest tube, and pain management per CT surgery.  Chest tube discontinued 5/23, she is today off her fentanyl PCA. -Leukocytosis trending down which is reassuring, but still significantly elevated  Sinus tachycardia  -Patient remains significantly weak and frail, and with significant tachycardia with minimal activity, she was encouraged to get out of bed to chair, discussed with staff they will encourage to ambulate for short distances initially and advance as tolerated(heart rate goes up to 150 with activity in the room).   COVID-19 Labs  No results for input(s): DDIMER, FERRITIN, LDH, CRP in the last 72 hours.  Lab Results  Component Value Date   SARSCOV2NAA NEGATIVE 07/13/2019   Elevated D-dimers -Is most likely in the setting of severe chest infection, CTA chest negative for PE, venous Dopplers negative for DVT .  Hyponatremia -Mild, symptomatic, continue to monitor  Hypomagnesemia -Repleted  History of asthma -No active wheezing   Code Status : Full  Family Communication  : None at bedside.  Disposition Plan  :  Status is: Inpatient  Remains inpatient appropriate because:Hemodynamically  unstable   Dispo: The patient is from: Home              Anticipated d/c is to: Home              Anticipated d/c date is: 2 days              Patient currently is not medically stable to d/c.  Remains significantly tachycardic dizzy lightheaded with minimal activity      Consults  :  PCCM , CT surgery  Procedures  :  -Thoracentesis by IR. -  VIDEO ASSISTED THORACOSCOPY (VATS)/DECORTICATION and Drainage of Empyema (Left)  5/20 by Dr. Vickey Sages  DVT Prophylaxis  : [place] Lovenox  Lab Results  Component Value Date   PLT 668 (H) 07/20/2019    Antibiotics  :    Anti-infectives (From admission, onward)   Start     Dose/Rate Route Frequency Ordered Stop   07/19/19 2200  linezolid (ZYVOX) IVPB 600 mg     600 mg 300 mL/hr over 60 Minutes Intravenous Every 12 hours 07/19/19 1306     07/19/19 1000  vancomycin (VANCOCIN) IVPB 500 mg/100 ml premix  Status:  Discontinued     500 mg 100 mL/hr over 60 Minutes Intravenous Every 12 hours 07/19/19 0610 07/19/19 1306   07/15/19 2200  vancomycin (VANCOREADY) IVPB 500 mg/100 mL  Status:  Discontinued     500 mg 100 mL/hr over 60 Minutes Intravenous Every 12 hours 07/15/19 0723 07/19/19 0608   07/15/19 1623  ceFAZolin (ANCEF) IVPB 2g/100 mL premix     2 g 200 mL/hr over 30 Minutes Intravenous 30 min pre-op 07/15/19 1623     07/15/19 0800  metroNIDAZOLE (FLAGYL) IVPB 500 mg  Status:  Discontinued     500 mg 100 mL/hr over 60 Minutes Intravenous Every 8 hours 07/15/19 0711 07/17/19 1102   07/15/19 0800  vancomycin (VANCOCIN) IVPB 1000 mg/200 mL premix     1,000 mg 200 mL/hr over 60 Minutes Intravenous  Once 07/15/19 0723 07/15/19 1124   07/15/19 0800  ceFEPIme (MAXIPIME) 2 g in sodium chloride 0.9 % 100 mL IVPB  Status:  Discontinued     2 g 200 mL/hr over 30 Minutes Intravenous Every 12 hours  07/15/19 0723 07/17/19 1102   07/14/19 2200  cefTRIAXone (ROCEPHIN) 2 g in sodium chloride 0.9 % 100 mL IVPB  Status:  Discontinued     2 g 200 mL/hr  over 30 Minutes Intravenous Every 24 hours 07/13/19 2352 07/15/19 0711   07/14/19 2000  azithromycin (ZITHROMAX) 500 mg in sodium chloride 0.9 % 250 mL IVPB  Status:  Discontinued     500 mg 250 mL/hr over 60 Minutes Intravenous Every 24 hours 07/13/19 2352 07/14/19 1559   07/14/19 0000  cefTRIAXone (ROCEPHIN) 2 g in sodium chloride 0.9 % 100 mL IVPB     2 g 200 mL/hr over 30 Minutes Intravenous  Once 07/13/19 2357 07/14/19 0235   07/13/19 1715  levofloxacin (LEVAQUIN) IVPB 750 mg     750 mg 100 mL/hr over 90 Minutes Intravenous  Once 07/13/19 1706 07/13/19 1937        Objective:   Vitals:   07/20/19 0359 07/20/19 0808 07/20/19 0826 07/20/19 1257  BP: (!) 147/69 (!) 154/82    Pulse:  (!) 116  (!) 118  Resp: 18 18    Temp: 98.2 F (36.8 C) 98.4 F (36.9 C)  97.9 F (36.6 C)  TempSrc: Oral Oral  Oral  SpO2: 94% 96% 90% 91%  Weight:      Height:        Wt Readings from Last 3 Encounters:  07/19/19 48.7 kg  07/11/15 42 kg  10/21/14 42.2 kg     Intake/Output Summary (Last 24 hours) at 07/20/2019 1535 Last data filed at 07/20/2019 1344 Gross per 24 hour  Intake 0 ml  Output 2 ml  Net -2 ml     Physical Exam  Awake Alert, Oriented X 3, No new F.N deficits, Normal affect Symmetrical Chest wall movement, diminished air entry with some rhonchi at the left side RRR,No Gallops,Rubs or new Murmurs, No Parasternal Heave +ve B.Sounds, Abd Soft, No tenderness, No rebound - guarding or rigidity. No Cyanosis, Clubbing or edema, No new Rash or bruise      Data Review:    CBC Recent Labs  Lab 07/14/19 0517 07/15/19 0426 07/16/19 0326 07/17/19 0343 07/18/19 0417 07/19/19 0322 07/20/19 0829  WBC 16.8*   < > 35.4* 25.7* 18.7* 18.6* 16.5*  HGB 12.0   < > 11.3* 12.5 12.9 11.4* 12.5  HCT 36.0   < > 33.5* 38.1 39.3 34.9* 37.9  PLT 390   < > 367 423* 417* 447* 668*  MCV 89.6   < > 90.1 90.7 89.3 89.7 89.0  MCH 29.9   < > 30.4 29.8 29.3 29.3 29.3  MCHC 33.3   < > 33.7 32.8  32.8 32.7 33.0  RDW 11.6   < > 12.0 12.0 12.0 12.1 12.0  LYMPHSABS 0.3*  --   --   --   --   --   --   MONOABS 0.9  --   --   --   --   --   --   EOSABS 0.0  --   --   --   --   --   --   BASOSABS 0.1  --   --   --   --   --   --    < > = values in this interval not displayed.    Chemistries  Recent Labs  Lab 07/14/19 1962 07/14/19 2297 07/15/19 0426 07/15/19 0426 07/15/19 1730 07/15/19 1730 07/16/19 0326 07/17/19 9892 07/18/19 1194 07/19/19 1740 07/20/19 8144  NA 131*   < > 132*   < > 133*   < > 131* 134* 134* 135 134*  K 4.0   < > 3.7   < > 4.1   < > 4.6 4.3 3.6 3.4* 3.7  CL 98   < > 99   < > 100   < > 100 100 98 97* 97*  CO2 23   < > 21*   < > 24   < > 24 22 25 26 28   GLUCOSE 126*   < > 123*   < > 154*   < > 200* 68* 90 115* 138*  BUN 7*   < > 9   < > 10   < > 10 12 8  7* <5*  CREATININE 0.65   < > 0.72   < > 0.64   < > 0.51 0.59 0.42* 0.43* 0.48  CALCIUM 8.5*   < > 8.4*   < > 8.1*   < > 8.2* 8.4* 8.1* 8.1* 8.4*  MG 1.5*  --  2.1  --   --   --   --   --   --   --   --   AST 34  --  23  --  29  --   --  34  --   --   --   ALT 54*  --  36  --  34  --   --  33  --   --   --   ALKPHOS 140*  --  144*  --  135*  --   --  194*  --   --   --   BILITOT 1.6*  --  1.3*  --  0.9  --   --  1.4*  --   --   --    < > = values in this interval not displayed.   ------------------------------------------------------------------------------------------------------------------ No results for input(s): CHOL, HDL, LDLCALC, TRIG, CHOLHDL, LDLDIRECT in the last 72 hours.  No results found for: HGBA1C ------------------------------------------------------------------------------------------------------------------ No results for input(s): TSH, T4TOTAL, T3FREE, THYROIDAB in the last 72 hours.  Invalid input(s): FREET3 ------------------------------------------------------------------------------------------------------------------ No results for input(s): VITAMINB12, FOLATE, FERRITIN, TIBC,  IRON, RETICCTPCT in the last 72 hours.  Coagulation profile Recent Labs  Lab 07/15/19 0426 07/15/19 1730  INR 1.3* 1.3*    No results for input(s): DDIMER in the last 72 hours.  Cardiac Enzymes No results for input(s): CKMB, TROPONINI, MYOGLOBIN in the last 168 hours.  Invalid input(s): CK ------------------------------------------------------------------------------------------------------------------ No results found for: BNP  Inpatient Medications  Scheduled Meds: . acetaminophen  1,000 mg Oral Q6H   Or  . acetaminophen (TYLENOL) oral liquid 160 mg/5 mL  1,000 mg Oral Q6H  . bisacodyl  10 mg Oral Daily  . docusate sodium  100 mg Oral BID  . enoxaparin (LOVENOX) injection  40 mg Subcutaneous Daily  . feeding supplement (ENSURE ENLIVE)  237 mL Oral BID BM  . latanoprost  1 drop Both Eyes QHS  . loratadine  10 mg Oral Daily  . mouth rinse  15 mL Mouth Rinse BID  . mometasone-formoterol  2 puff Inhalation BID  . pneumococcal 23 valent vaccine  0.5 mL Intramuscular Tomorrow-1000  . polyethylene glycol  17 g Oral BID  . senna-docusate  1 tablet Oral QHS  . sodium chloride flush  3 mL Intravenous Q12H   Continuous Infusions: .  ceFAZolin (ANCEF) IV    . lactated ringers 10 mL/hr at 07/15/19 1206  .  linezolid (ZYVOX) IV 600 mg (07/20/19 1124)  . sodium chloride     PRN Meds:.acetaminophen **OR** [DISCONTINUED] acetaminophen, guaiFENesin-dextromethorphan, ipratropium-albuterol, levalbuterol, ondansetron **OR** ondansetron (ZOFRAN) IV, oxyCODONE, traMADol  Micro Results Recent Results (from the past 240 hour(s))  Culture, blood (Routine X 2) w Reflex to ID Panel     Status: None   Collection Time: 07/13/19  5:30 PM   Specimen: BLOOD RIGHT FOREARM  Result Value Ref Range Status   Specimen Description BLOOD RIGHT FOREARM  Final   Special Requests   Final    BOTTLES DRAWN AEROBIC AND ANAEROBIC Blood Culture adequate volume   Culture   Final    NO GROWTH 5 DAYS Performed  at [facility], [address], [place], [place] [address]    Report Status 07/18/2019 FINAL  Final  Culture, blood (Routine X 2) w Reflex to ID Panel     Status: None   Collection Time: 07/13/19  5:30 PM   Specimen: BLOOD  Result Value Ref Range Status   Specimen Description BLOOD LEFT ANTECUBITAL  Final   Special Requests   Final    BOTTLES DRAWN AEROBIC AND ANAEROBIC Blood Culture results may not be optimal due to an inadequate volume of blood received in culture bottles   Culture   Final    NO GROWTH 5 DAYS Performed at [facility], [address] [place], [place] [address]    Report Status 07/18/2019 FINAL  Final  SARS Coronavirus 2 by RT PCR (hospital order, performed in [facility] hospital lab) Nasopharyngeal Nasopharyngeal Swab     Status: None   Collection Time: 07/13/19  5:41 PM   Specimen: Nasopharyngeal Swab  Result Value Ref Range Status   SARS Coronavirus 2 NEGATIVE NEGATIVE Final    Comment: (NOTE) SARS-CoV-2 target nucleic acids are NOT DETECTED. The SARS-CoV-2 RNA is generally detectable in upper and lower respiratory specimens during the acute phase of infection. The lowest concentration of SARS-CoV-2 viral copies this assay can detect is 250 copies / mL. A negative result does not preclude SARS-CoV-2 infection and should not be used as the sole basis for treatment or other patient management decisions.  A negative result may occur with improper specimen collection / handling, submission of specimen other than nasopharyngeal swab, presence of viral mutation(s) within the areas targeted by this assay, and inadequate number of viral copies (<250 copies / mL). A negative result must be combined with clinical observations, patient history, and epidemiological information. Fact Sheet for Patients:   BoilerBrush.com.cy Fact Sheet for Healthcare Providers: https://pope.com/ This test is not yet approved or  cleared  by the [place] FDA and has been authorized for detection and/or diagnosis of SARS-CoV-2 by FDA under an Emergency Use Authorization (EUA).  This EUA will remain in effect (meaning this test can be used) for the duration of the COVID-19 declaration under Section 564(b)(1) of the Act, 21 U.S.C. section 360bbb-3(b)(1), unless the authorization is terminated or revoked sooner. Performed at [facility], [address] [place], [place] [address]   MRSA PCR Screening     Status: None   Collection Time: 07/14/19  7:30 AM   Specimen: Nasal Mucosa; Nasopharyngeal  Result Value Ref Range Status   MRSA by PCR NEGATIVE NEGATIVE Final    Comment:        The GeneXpert MRSA Assay (FDA approved for NASAL specimens only), is one component of a comprehensive MRSA colonization surveillance program. It is not intended to  diagnose MRSA infection nor to guide or monitor treatment for MRSA infections. Performed at [facility], [address] [place], [place] [address]   Respiratory Panel by PCR     Status: None   Collection Time: 07/14/19  7:30 AM   Specimen: Nasal Mucosa; Respiratory  Result Value Ref Range Status   Adenovirus NOT DETECTED NOT DETECTED Final   Coronavirus 229E NOT DETECTED NOT DETECTED Final    Comment: (NOTE) The Coronavirus on the Respiratory Panel, DOES NOT test for the novel  Coronavirus (2019 nCoV)    Coronavirus HKU1 NOT DETECTED NOT DETECTED Final   Coronavirus NL63 NOT DETECTED NOT DETECTED Final   Coronavirus OC43 NOT DETECTED NOT DETECTED Final   Metapneumovirus NOT DETECTED NOT DETECTED Final   Rhinovirus / Enterovirus NOT DETECTED NOT DETECTED Final   Influenza A NOT DETECTED NOT DETECTED Final   Influenza B NOT DETECTED NOT DETECTED Final   Parainfluenza Virus 1 NOT DETECTED NOT DETECTED Final   Parainfluenza Virus 2 NOT DETECTED NOT DETECTED Final   Parainfluenza Virus 3 NOT DETECTED NOT DETECTED Final   Parainfluenza Virus 4 NOT  DETECTED NOT DETECTED Final   Respiratory Syncytial Virus NOT DETECTED NOT DETECTED Final   Bordetella pertussis NOT DETECTED NOT DETECTED Final   Chlamydophila pneumoniae NOT DETECTED NOT DETECTED Final   Mycoplasma pneumoniae NOT DETECTED NOT DETECTED Final    Comment: Performed at [facility], [address] [place], [place] [address]  Gram stain     Status: None   Collection Time: 07/14/19  8:46 AM   Specimen: Lung, Left; Pleural Fluid  Result Value Ref Range Status   Specimen Description PLEURAL FLUID  Final   Special Requests LEFT LUNG  Final   Gram Stain   Final    WBC PRESENT,BOTH PMN AND MONONUCLEAR GRAM POSITIVE COCCI CYTOSPIN SMEAR Gram Stain Report Called to,Read Back By and Verified With: Elfredia Nevins RN [id number] 07/14/19 A BROWNING Performed at [facility], [address], [place], [place] [address]    Report Status 07/14/2019 FINAL  Final  Culture, body fluid-bottle     Status: Abnormal   Collection Time: 07/14/19  8:46 AM   Specimen: Pleura  Result Value Ref Range Status   Specimen Description PLEURAL FLUID  Final   Special Requests LEFT LUNG  Final   Gram Stain   Final    IN BOTH AEROBIC AND ANAEROBIC BOTTLES GRAM POSITIVE COCCI IN CLUSTERS CRITICAL RESULT CALLED TO, READ BACK BY AND VERIFIED WITH: RN R SANTIAGO [phone] AT 908 AM BY CM    Culture (A)  Final    METHICILLIN RESISTANT STAPHYLOCOCCUS AUREUS CRITICAL RESULT CALLED TO, READ BACK BY AND VERIFIED WITH: RN R SANTIAGO [phone] AR 908 AM BY CM Performed at [facility], [address] [place], [place] [address]    Report Status 07/16/2019 FINAL  Final   Organism ID, Bacteria METHICILLIN RESISTANT STAPHYLOCOCCUS AUREUS  Final      Susceptibility   Methicillin resistant staphylococcus aureus - MIC*    CIPROFLOXACIN >=8 RESISTANT Resistant     ERYTHROMYCIN >=8 RESISTANT Resistant     GENTAMICIN <=0.5 SENSITIVE Sensitive     OXACILLIN >=4 RESISTANT Resistant     TETRACYCLINE <=1 SENSITIVE  Sensitive     VANCOMYCIN 1 SENSITIVE Sensitive     TRIMETH/SULFA <=10 SENSITIVE Sensitive     CLINDAMYCIN <=0.25 SENSITIVE Sensitive     RIFAMPIN <=0.5 SENSITIVE Sensitive     Inducible  Clindamycin NEGATIVE Sensitive     * METHICILLIN RESISTANT STAPHYLOCOCCUS AUREUS  Anaerobic culture     Status: None   Collection Time: 07/15/19  2:03 PM   Specimen: Pleural, Left; Body Fluid  Result Value Ref Range Status   Specimen Description FLUID LEFT PLEURAL  Final   Special Requests NONE  Final   Gram Stain   Final    ABUNDANT WBC PRESENT,BOTH PMN AND MONONUCLEAR NO ORGANISMS SEEN    Culture   Final    NO ANAEROBES ISOLATED Performed at [facility], [address], [place], [place] [address]    Report Status 07/20/2019 FINAL  Final  Anaerobic culture     Status: None   Collection Time: 07/15/19  2:03 PM   Specimen: Pleural, Left; Body Fluid  Result Value Ref Range Status   Specimen Description FLUID LEFT PLEURAL  Final   Special Requests PEEL  Final   Gram Stain   Final    ABUNDANT WBC PRESENT,BOTH PMN AND MONONUCLEAR RARE GRAM POSITIVE COCCI    Culture   Final    NO ANAEROBES ISOLATED Performed at [facility], [address], [place], [place] [address]    Report Status 07/20/2019 FINAL  Final  Body fluid culture     Status: None   Collection Time: 07/15/19  2:03 PM   Specimen: Pleural, Left; Body Fluid  Result Value Ref Range Status   Specimen Description FLUID LEFT PLEURAL  Final   Special Requests PEEL  Final   Gram Stain   Final    ABUNDANT WBC PRESENT,BOTH PMN AND MONONUCLEAR RARE GRAM POSITIVE COCCI Performed at [facility], [address], [place], [place] [address]    Culture FEW METHICILLIN RESISTANT STAPHYLOCOCCUS AUREUS  Final   Report Status 07/17/2019 FINAL  Final   Organism ID, Bacteria METHICILLIN RESISTANT STAPHYLOCOCCUS AUREUS  Final      Susceptibility   Methicillin resistant staphylococcus aureus - MIC*    CIPROFLOXACIN >=8 RESISTANT  Resistant     ERYTHROMYCIN >=8 RESISTANT Resistant     GENTAMICIN <=0.5 SENSITIVE Sensitive     OXACILLIN >=4 RESISTANT Resistant     TETRACYCLINE <=1 SENSITIVE Sensitive     VANCOMYCIN 1 SENSITIVE Sensitive     TRIMETH/SULFA <=10 SENSITIVE Sensitive     CLINDAMYCIN <=0.25 SENSITIVE Sensitive     RIFAMPIN <=0.5 SENSITIVE Sensitive     Inducible Clindamycin NEGATIVE Sensitive     * FEW METHICILLIN RESISTANT STAPHYLOCOCCUS AUREUS  Body fluid culture     Status: Abnormal   Collection Time: 07/15/19  2:03 PM   Specimen: Fluid  Result Value Ref Range Status   Specimen Description FLUID LEFT PLEURAL  Final   Special Requests NONE  Final   Gram Stain   Final    ABUNDANT WBC PRESENT,BOTH PMN AND MONONUCLEAR NO ORGANISMS SEEN    Culture (A)  Final    METHICILLIN RESISTANT STAPHYLOCOCCUS AUREUS SUSCEPTIBILITIES PERFORMED ON PREVIOUS CULTURE WITHIN THE LAST 5 DAYS. Performed at [facility], [address] [place], [place] [address]    Report Status 07/18/2019 FINAL  Final   Organism ID, Bacteria METHICILLIN RESISTANT STAPHYLOCOCCUS AUREUS  Final      Susceptibility   Methicillin resistant staphylococcus aureus - MIC*    CIPROFLOXACIN >=8 RESISTANT Resistant     ERYTHROMYCIN >=8 RESISTANT Resistant     GENTAMICIN <=0.5 SENSITIVE Sensitive     OXACILLIN >=4 RESISTANT Resistant     TETRACYCLINE <=1 SENSITIVE Sensitive  VANCOMYCIN 1 SENSITIVE Sensitive     TRIMETH/SULFA <=10 SENSITIVE Sensitive     CLINDAMYCIN <=0.25 SENSITIVE Sensitive     RIFAMPIN <=0.5 SENSITIVE Sensitive     Inducible Clindamycin NEGATIVE Sensitive     * METHICILLIN RESISTANT STAPHYLOCOCCUS AUREUS    Radiology Reports DG Chest 1 View  Result Date: 07/14/2019 CLINICAL DATA:  Status post thoracentesis on the left EXAM: CHEST  1 VIEW COMPARISON:  07/13/2019 FINDINGS: Cardiac shadow is stable. Slight reduction in pleural fluid is noted when compare with the prior exam. No pneumothorax is seen. Slight  increased density is noted throughout the left lung which may be related to some re-expansion edema. No bony abnormality is noted. IMPRESSION: No pneumothorax following thoracentesis. There are however changes suggestive of re-expansion edema. Electronically Signed   By: Alcide Clever M.D.   On: 07/14/2019 08:54   DG Chest 2 View  Result Date: 07/19/2019 CLINICAL DATA:  Pleural effusion on left EXAM: CHEST - 2 VIEW COMPARISON:  07/18/2019 FINDINGS: Interval removal of left chest tubes. No pneumothorax. Otherwise stable appearance of the left hemithorax. No confluent opacity on the right. Heart is normal size. IMPRESSION: Interval removal of left chest tubes without pneumothorax. Otherwise no change. Electronically Signed   By: Charlett Nose M.D.   On: 07/19/2019 07:37   DG Chest 2 View  Result Date: 07/13/2019 CLINICAL DATA:  Chest pain, shortness of breath. EXAM: CHEST - 2 VIEW COMPARISON:  October 21, 2014. FINDINGS: The heart size and mediastinal contours are within normal limits. No pneumothorax is noted. Right lung is clear. Mild left pleural effusion is noted with associated left basilar infiltrate or atelectasis. The visualized skeletal structures are unremarkable. IMPRESSION: Mild left pleural effusion is noted with associated left basilar infiltrate or atelectasis. Electronically Signed   By: Lupita Raider M.D.   On: 07/13/2019 16:47   CT CHEST WO CONTRAST  Result Date: 07/14/2019 CLINICAL DATA:  And pain in a status post thoracentesis EXAM: CT CHEST WITHOUT CONTRAST TECHNIQUE: Multidetector CT imaging of the chest was performed following the standard protocol without IV contrast. COMPARISON:  07/13/2019 FINDINGS: Cardiovascular: Heart is unremarkable without pericardial effusion. Grossly normal caliber of the thoracic aorta. Evaluation limited without intravenous contrast. Mediastinum/Nodes: Evaluation is limited without IV contrast. The thyroid, trachea, and esophagus are grossly unremarkable.  No discrete adenopathy. Lungs/Pleura: Since the prior CT, there is been significant increase in the size of the multilocular complex left pleural effusion. There is progressive consolidation throughout the left lung, with relative sparing of the left apex. No left-sided pneumothorax after thoracentesis. There is a small free-flowing right pleural effusion. Patchy consolidation right lower lobe likely atelectasis. The central airways are patent. Upper Abdomen: No acute abnormality. Musculoskeletal: No acute or destructive bony lesions. Reconstructed images demonstrate no additional findings. IMPRESSION: 1. Enlarging multilocular complex left pleural effusion, compatible with empyema. 2. Progressive left lung consolidation with relative sparing of the left apex. This could reflect underlying pneumonia. 3. Trace free-flowing right pleural effusion, new since prior study. Electronically Signed   By: Sharlet Salina M.D.   On: 07/14/2019 20:08   CT Angio Chest PE W and/or Wo Contrast  Result Date: 07/13/2019 CLINICAL DATA:  Shortness of breath with left-sided chest pain. EXAM: CT ANGIOGRAPHY CHEST WITH CONTRAST TECHNIQUE: Multidetector CT imaging of the chest was performed using the standard protocol during bolus administration of intravenous contrast. Multiplanar CT image reconstructions and MIPs were obtained to evaluate the vascular anatomy. CONTRAST:  75mL OMNIPAQUE IOHEXOL 350  MG/ML SOLN COMPARISON:  None. FINDINGS: Cardiovascular: Heart size normal. Small pericardial effusion evident. No thoracic aortic aneurysm. No filling defects within the opacified pulmonary arteries to suggest the presence of an acute pulmonary embolus Mediastinum/Nodes: 11 mm short axis AP window lymph node visible on image 44/5 nodular soft tissue is identified in the anterior mediastinum, potentially related to small clustered lymph nodes. No right hilar lymphadenopathy. There is abnormal soft tissue attenuation in the left hilum. There  is no axillary lymphadenopathy. Lungs/Pleura: Ground-glass opacity is seen fairly diffusely in the left upper lobe with diffuse consolidation of the lingula with airspace consolidation tracking centrally into the left hilum. There is left lower lobe collapse inferiorly. These changes are associated with a moderate left pleural effusion demonstrating loculation in the apex and anterior upper hemithorax. Nodular pleural thickening or loculated fluid is seen along the medial pleura adjacent to the transverse aorta. No right pleural effusion. Upper Abdomen: Unremarkable. Musculoskeletal: No worrisome lytic or sclerotic osseous abnormality. Review of the MIP images confirms the above findings. IMPRESSION: 1. No CT evidence for acute pulmonary embolus. 2. Diffuse ground-glass opacity in the left upper lobe with diffuse consolidation of the lingula tracking centrally into the left hilum. This is associated with a moderate left pleural effusion demonstrating loculation and prominent areas of left lower lobe atelectasis. Nodular pleural thickening or loculated fluid is identified along the medial pleura adjacent to the transverse aorta. Mildly enlarged AP window lymph node with question of soft tissue nodularity/clustered lymph nodes in the anterior mediastinum. These changes may all be related to pneumonia, given the apparent pleural nodularity towards the apex, neoplasm is a distinct concern. Close follow-up recommended. Electronically Signed   By: Kennith CenterEric  Mansell M.D.   On: 07/13/2019 20:44   DG CHEST PORT 1 VIEW  Result Date: 07/18/2019 CLINICAL DATA:  Evaluate empyema EXAM: PORTABLE CHEST 1 VIEW COMPARISON:  07/17/2019 FINDINGS: Cardiac shadow is stable. Two chest tubes are again seen on the left. The overall appearance of the left hemithorax is stable. No pneumothorax is noted. Diffuse opacification throughout the lung is seen with multiple air bronchograms as well as persistent empyema. Right lung remains clear.  Bony structures are unremarkable. IMPRESSION: Stable appearance of the chest when compared with the previous day. Electronically Signed   By: Alcide CleverMark  Lukens M.D.   On: 07/18/2019 08:46   DG CHEST PORT 1 VIEW  Result Date: 07/17/2019 CLINICAL DATA:  Follow-up empyema EXAM: PORTABLE CHEST 1 VIEW COMPARISON:  07/16/2019 FINDINGS: Cardiac shadow is stable. Diffuse infiltrate is noted throughout the left lung. Two left thoracostomy catheters are noted in place. Some persistent pleural fluid is noted consistent with the given clinical history. No pneumothorax is seen. Right lung remains clear. IMPRESSION: Stable appearance of the chest when compare with the prior exam. Electronically Signed   By: Alcide CleverMark  Lukens M.D.   On: 07/17/2019 09:59   DG CHEST PORT 1 VIEW  Result Date: 07/16/2019 CLINICAL DATA:  Status post VATS and decortication on the left on 07/15/2019. EXAM: PORTABLE CHEST 1 VIEW COMPARISON:  Yesterday. FINDINGS: Two left chest tubes remain in place. No pneumothorax. Diffuse airspace opacity in the left lung with mild improvement. Possible minimal lateral and apical pleural fluid or thickening on the left. Interval minimal patchy opacity in the right lower lung zone. Stable mild peribronchial thickening on the right. Grossly normal sized heart. Decreased subcutaneous air on the left. Unremarkable bones. Gas distended bowel in the left upper abdomen. IMPRESSION: 1. No pneumothorax. 2. Diffuse  left lung pneumonia and/or pulmonary edema, mildly improved. 3. Interval minimal patchy atelectasis or pneumonia in the right lower lung zone. 4. Decreased subcutaneous air on the left. Electronically Signed   By: Claudie Revering M.D.   On: 07/16/2019 08:51   DG Chest Port 1 View  Result Date: 07/15/2019 CLINICAL DATA:  Post lung surgery follow-up. EXAM: PORTABLE CHEST 1 VIEW COMPARISON:  07/15/2019 FINDINGS: Examination demonstrates adequate lung volumes with 2 left-sided chest tubes present. Mild opacification  throughout the left lung with improved aeration compared to the prior exam. Right lung is clear. Cardiomediastinal silhouette is within normal. Mild subcutaneous emphysema over the lower left flank. Remainder the exam is unchanged. IMPRESSION: Hazy opacification over the left lung with interval improved aeration post placement 2 left-sided chest tubes. Electronically Signed   By: Marin Olp M.D.   On: 07/15/2019 14:50   DG Chest Port 1 View  Result Date: 07/15/2019 CLINICAL DATA:  Empyema. EXAM: PORTABLE CHEST 1 VIEW COMPARISON:  CT 07/14/2019.  Chest x-ray 07/14/2019. FINDINGS: Complete opacification left hemithorax is noted on today's exam. This is most consistent with reaccumulation of left pleural fluid/progressive empyema. Underlying pulmonary infiltrate cannot be excluded. No pneumothorax. Heart size cannot be evaluated no acute bony abnormality. IMPRESSION: Complete opacification left hemithorax is noted on today's exam. This is most consistent with reaccumulation of left pleural fluid/progressive empyema. Electronically Signed   By: Marcello Moores  Register   On: 07/15/2019 08:40   ECHOCARDIOGRAM COMPLETE  Result Date: 07/15/2019    ECHOCARDIOGRAM REPORT   Patient Name:   BULA CAVALIERI Date of Exam: 07/15/2019 Medical Rec #:  [id number]              Height:       57.0 in Accession #:    [id number]             Weight:       95.8 lb Date of Birth:  03-06-1960               BSA:          1.315 m Patient Age:    30 years               BP:           143/90 mmHg Patient Gender: F                      HR:           120 bpm. Exam Location:  Inpatient Procedure: 2D Echo Indications:    Elevated Troponin  History:        Patient has no prior history of Echocardiogram examinations.                 Signs/Symptoms:Shortness of Breath. GERD. Shortness of breath                 with associated chest and shoulder pain, fever. Sepsis secondary                 to pneumonia, with loculated empyema.  Sonographer:     Darlina Sicilian RDCS Referring Phys: [place]  1. Left ventricular ejection fraction, by estimation, is 55%. The left ventricle has normal function. The left ventricle has no regional wall motion abnormalities. Left ventricular diastolic parameters are indeterminate.  2. Right ventricular systolic function is normal. The right ventricular size is normal. Tricuspid regurgitation signal is inadequate for assessing PA pressure.  3.  The mitral valve is normal in structure. Trivial mitral valve regurgitation. No evidence of mitral stenosis.  4. The aortic valve is normal in structure. Aortic valve regurgitation is not visualized. No aortic stenosis is present.  5. Dilated pulmonary artery.  6. The inferior vena cava is normal in size with greater than 50% respiratory variability, suggesting right atrial pressure of 3 mmHg. FINDINGS  Left Ventricle: Left ventricular ejection fraction, by estimation, is 55%. The left ventricle has normal function. The left ventricle has no regional wall motion abnormalities. The left ventricular internal cavity size was normal in size. There is no left ventricular hypertrophy. Left ventricular diastolic parameters are indeterminate. Right Ventricle: The right ventricular size is normal. No increase in right ventricular wall thickness. Right ventricular systolic function is normal. Tricuspid regurgitation signal is inadequate for assessing PA pressure. Left Atrium: Left atrial size was normal in size. Right Atrium: Right atrial size was normal in size. Pericardium: There is no evidence of pericardial effusion. Mitral Valve: The mitral valve is normal in structure. Normal mobility of the mitral valve leaflets. Trivial mitral valve regurgitation. No evidence of mitral valve stenosis. Tricuspid Valve: The tricuspid valve is normal in structure. Tricuspid valve regurgitation is not demonstrated. No evidence of tricuspid stenosis. Aortic Valve: The aortic valve is  normal in structure. Aortic valve regurgitation is not visualized. No aortic stenosis is present. Pulmonic Valve: The pulmonic valve was normal in structure. Pulmonic valve regurgitation is not visualized. No evidence of pulmonic stenosis. Aorta: The aortic root is normal in size and structure. Pulmonary Artery: The pulmonary artery is dilated. Venous: The inferior vena cava is normal in size with greater than 50% respiratory variability, suggesting right atrial pressure of 3 mmHg. IAS/Shunts: There is redundancy of the interatrial septum. No atrial level shunt detected by color flow Doppler.  LEFT VENTRICLE PLAX 2D LVIDd:         3.70 cm LVIDs:         2.70 cm LV PW:         0.70 cm LV IVS:        0.90 cm LVOT diam:     1.90 cm LV SV:         34 LV SV Index:   26 LVOT Area:     2.84 cm  LV Volumes (MOD) LV vol d, MOD A2C: 73.8 ml LV vol d, MOD A4C: 71.2 ml LV vol s, MOD A2C: 34.9 ml LV vol s, MOD A4C: 36.1 ml LV SV MOD A2C:     38.9 ml LV SV MOD A4C:     71.2 ml LV SV MOD BP:      37.4 ml RIGHT VENTRICLE RV S prime:     17.70 cm/s TAPSE (M-mode): 1.9 cm LEFT ATRIUM             Index       RIGHT ATRIUM          Index LA diam:        3.00 cm 2.28 cm/m  RA Area:     7.41 cm LA Vol (A2C):   26.7 ml 20.30 ml/m RA Volume:   12.50 ml 9.50 ml/m LA Vol (A4C):   23.3 ml 17.71 ml/m LA Biplane Vol: 27.2 ml 20.68 ml/m  AORTIC VALVE LVOT Vmax:   89.50 cm/s LVOT Vmean:  61.600 cm/s LVOT VTI:    0.120 m  AORTA Ao Root diam: 2.90 cm Ao Asc diam:  2.70 cm  SHUNTS Systemic VTI:  0.12 m Systemic Diam: 1.90 cm Weston Brass MD Electronically signed by Weston Brass MD Signature Date/Time: 07/15/2019/12:54:26 PM    Final    VAS [place] LOWER EXTREMITY VENOUS (DVT)  Result Date: 07/15/2019  Lower Venous DVTStudy Indications: Elevated ddimer.  Comparison Study: no prior Performing Technologist: Blanch Media RVS  Examination Guidelines: A complete evaluation includes B-mode imaging, spectral Doppler, color Doppler, and power  Doppler as needed of all accessible portions of each vessel. Bilateral testing is considered an integral part of a complete examination. Limited examinations for reoccurring indications may be performed as noted. The reflux portion of the exam is performed with the patient in reverse Trendelenburg.  +---------+---------------+---------+-----------+----------+--------------+ RIGHT    CompressibilityPhasicitySpontaneityPropertiesThrombus Aging +---------+---------------+---------+-----------+----------+--------------+ CFV      Full           Yes      Yes                                 +---------+---------------+---------+-----------+----------+--------------+ SFJ      Full                                                        +---------+---------------+---------+-----------+----------+--------------+ FV Prox  Full                                                        +---------+---------------+---------+-----------+----------+--------------+ FV Mid   Full                                                        +---------+---------------+---------+-----------+----------+--------------+ FV DistalFull                                                        +---------+---------------+---------+-----------+----------+--------------+ PFV      Full                                                        +---------+---------------+---------+-----------+----------+--------------+ POP      Full           Yes      Yes                                 +---------+---------------+---------+-----------+----------+--------------+ PTV      Full                                                        +---------+---------------+---------+-----------+----------+--------------+  PERO     Full                                                        +---------+---------------+---------+-----------+----------+--------------+    +---------+---------------+---------+-----------+----------+--------------+ LEFT     CompressibilityPhasicitySpontaneityPropertiesThrombus Aging +---------+---------------+---------+-----------+----------+--------------+ CFV      Full           Yes      Yes                                 +---------+---------------+---------+-----------+----------+--------------+ SFJ      Full                                                        +---------+---------------+---------+-----------+----------+--------------+ FV Prox  Full                                                        +---------+---------------+---------+-----------+----------+--------------+ FV Mid   Full                                                        +---------+---------------+---------+-----------+----------+--------------+ FV DistalFull                                                        +---------+---------------+---------+-----------+----------+--------------+ PFV      Full                                                        +---------+---------------+---------+-----------+----------+--------------+ POP      Full           Yes      Yes                                 +---------+---------------+---------+-----------+----------+--------------+ PTV      Full                                                        +---------+---------------+---------+-----------+----------+--------------+ PERO     Full                                                        +---------+---------------+---------+-----------+----------+--------------+  Summary: BILATERAL: - No evidence of deep vein thrombosis seen in the lower extremities, bilaterally. - RIGHT: - A cystic structure is found in the popliteal fossa.  LEFT: - No cystic structure found in the popliteal fossa.  *See table(s) above for measurements and observations. Electronically signed by Sherald Hess MD on 07/15/2019 at 5:26:31  PM.    Final    IR THORACENTESIS ASP PLEURAL SPACE W/IMG GUIDE  Result Date: 07/14/2019 INDICATION: Patient with history of recurrent pneumonia, presents with shortness of breath and left pleural effusion. Request is made for diagnostic and therapeutic left thoracentesis. EXAM: ULTRASOUND GUIDED DIAGNOSTIC AND THERAPEUTIC LEFT THORACENTESIS MEDICATIONS: 10 mL 1% lidocaine COMPLICATIONS: SIR Level A - No therapy, no consequence. PROCEDURE: An ultrasound guided thoracentesis was thoroughly discussed with the patient and questions answered. The benefits, risks, alternatives and complications were also discussed. The patient understands and wishes to proceed with the procedure. Written consent was obtained. Ultrasound was performed to localize and mark an adequate pocket of fluid in the left chest. The area was then prepped and draped in the normal sterile fashion. 1% Lidocaine was used for local anesthesia. Under ultrasound guidance a 6 Fr Safe-T-Centesis catheter was introduced. Thoracentesis was performed. The catheter was removed and a dressing applied. FINDINGS: A total of approximately 250 mL of cloudy, purulent-appearing fluid was removed. Samples were sent to the laboratory as requested by the clinical team. Postprocedure chest x-ray shows show suggestion of re-expansion edema. IMPRESSION: Successful ultrasound guided diagnostic and therapeutic thoracentesis yielding 250 mL of pleural fluid. Read by: Loyce Dys PA-C Electronically Signed   By: Gilmer Mor D.O.   On: 07/14/2019 11:36     Huey Bienenstock M.D on 07/20/2019 at 3:35 PM  Between 7am to 7pm - Pager - [phone]  [facility] -  Office  [phone]

## 2019-07-20 NOTE — Discharge Instructions (Signed)
Video-Assisted Thoracic Surgery, Care After This sheet gives you information about how to care for yourself after your procedure. Your health care provider may also give you more specific instructions. If you have problems or questions, contact your health care provider. What can I expect after the procedure? After the procedure, it is common to have:  Some pain and soreness in your chest.  Pain when breathing in (inhaling) and coughing.  Constipation.  Fatigue.  Difficulty sleeping. Follow these instructions at home: Preventing pneumonia  Take deep breaths or do breathing exercises as instructed by your health care provider. Doing this helps prevent lung infection (pneumonia).  Cough frequently. Coughing may cause discomfort, but it is important to clear mucus (phlegm) and expand your lungs. If it hurts to cough, hold a pillow against your chest or place the palms of both hands on top of the incision (use splinting) when you cough. This may help relieve discomfort.  If you were given an incentive spirometer, use it as directed. An incentive spirometer is a tool that measures how well you are filling your lungs with each breath.  Participate in pulmonary rehabilitation as directed by your health care provider. This is a program that combines education, exercise, and support from a team of specialists. The goal is to help you heal and get back to your normal activities as soon as possible. Medicines  Take over-the-counter or prescription medicines only as told by your health care provider.  If you have pain, take pain-relieving medicine before your pain becomes severe. This is important because if your pain is under control, you will be able to breathe and cough more comfortably.  If you were prescribed an antibiotic medicine, take it as told by your health care provider. Do not stop taking the antibiotic even if you start to feel better. Activity  Ask your health care provider what  activities are safe for you.  Avoid activities that use your chest muscles for at least 3-4 weeks.  Do not lift anything that is heavier than 10 lb (4.5 kg), or the limit that your health care provider tells you, until he or she says that it is safe. Incision care  Follow instructions from your health care provider about how to take care of your incision(s). Make sure you: ? Wash your hands with soap and water before you change your bandage (dressing). If soap and water are not available, use hand sanitizer. ? Change your dressing as told by your health care provider. ? Leave stitches (sutures), skin glue, or adhesive strips in place. These skin closures may need to stay in place for 2 weeks or longer. If adhesive strip edges start to loosen and curl up, you may trim the loose edges. Do not remove adhesive strips completely unless your health care provider tells you to do that.  Keep your dressing dry until it has been removed.  Check your incision area every day for signs of infection. Check for: ? Redness, swelling, or pain. ? Fluid or blood. ? Warmth. ? Pus or a bad smell. Bathing  Do not take baths, swim, or use a hot tub until your health care provider approves. You may take showers.  After your dressing has been removed, use soap and water to gently wash your incision area. Do not use anything else to clean your incision(s) unless your health care provider tells you to do this. Driving   Do not drive until your health care provider approves.  Do not drive or   use heavy machinery while taking prescription pain medicine. Eating and drinking  Eat a healthy, balanced diet as instructed by your health care provider. A healthy diet includes plenty of fresh fruits and vegetables, whole grains, and low-fat (lean) proteins.  Limit foods that are high in fat and processed sugars, such as fried and sweet foods.  Drink enough fluid to keep your urine clear or pale yellow. General  instructions   To prevent or treat constipation while you are taking prescription pain medicine, your health care provider may recommend that you: ? Take over-the-counter or prescription medicines. ? Eat foods that are high in fiber, such as beans, fresh fruits and vegetables, and whole grains.  Do not use any products that contain nicotine or tobacco, such as cigarettes and e-cigarettes. If you need help quitting, ask your health care provider.  Avoid secondhand smoke.  Wear compression stockings as told by your health care provider. These stockings help to prevent blood clots and reduce swelling in your legs.  If you have a chest tube, care for it as instructed by your health care provider. Do not travel by airplane during the 2 weeks after your chest tube is removed, or until your health care provider says that this is safe.  Keep all follow-up visits as told by your health care provider. This is important. Contact a health care provider if:  You have redness, swelling, or pain around an incision.  You have fluid or blood coming from an incision.  Your incision area feels warm to the touch.  You have pus or a bad smell coming from an incision.  You have a fever or chills.  You have nausea or vomiting.  You have pain that does not get better with medicine. Get help right away if:  You have chest pain.  Your heart is fluttering or beating rapidly.  You develop a rash.  You have shortness of breath or trouble breathing.  You are confused.  You have trouble speaking.  You feel weak, light-headed, or dizzy.  You faint. Summary  To help prevent lung infection (pneumonia), take deep breaths or do breathing exercises as instructed by your health care provider.  Cough frequently to clear mucus (phlegm) and expand your lungs. If it hurts to cough, hold a pillow against your chest or place the palms of both hands on top of the incision (use splinting) when you cough.  If  you have pain, take pain-relieving medicine before your pain becomes severe. This is important because if your pain is under control, you will be able to breathe and cough more comfortably.  Ask your health care provider what activities are safe for you. This information is not intended to replace advice given to you by your health care provider. Make sure you discuss any questions you have with your health care provider. Document Revised: 01/24/2017 Document Reviewed: 01/22/2016 [employer] Patient Education  2020 [employer]  

## 2019-07-21 ENCOUNTER — Inpatient Hospital Stay (HOSPITAL_COMMUNITY)

## 2019-07-21 DIAGNOSIS — J869 Pyothorax without fistula: Secondary | ICD-10-CM | POA: Diagnosis not present

## 2019-07-21 DIAGNOSIS — J189 Pneumonia, unspecified organism: Secondary | ICD-10-CM | POA: Diagnosis not present

## 2019-07-21 LAB — BASIC METABOLIC PANEL
Anion gap: 12 (ref 5–15)
BUN: <5 mg/dL — ABNORMAL LOW (ref 8–23)
CO2: 27 mmol/L (ref 22–32)
Calcium: 8.1 mg/dL — ABNORMAL LOW (ref 8.9–10.3)
Chloride: 92 mmol/L — ABNORMAL LOW (ref 98–111)
Creatinine, Ser: 0.51 mg/dL (ref 0.44–1.00)
GFR calc Af Amer: >60 mL/min (ref >60)
GFR calc non Af Amer: >60 mL/min (ref >60)
Glucose, Bld: 147 mg/dL — ABNORMAL HIGH (ref 70–99)
Potassium: 3.3 mmol/L — ABNORMAL LOW (ref 3.5–5.1)
Sodium: 131 mmol/L — ABNORMAL LOW (ref 135–145)

## 2019-07-21 LAB — CBC
HCT: 31.4 % — ABNORMAL LOW (ref 36.0–46.0)
Hemoglobin: 10.3 g/dL — ABNORMAL LOW (ref 12.0–15.0)
MCH: 29.3 pg (ref 26.0–34.0)
MCHC: 32.8 g/dL (ref 30.0–36.0)
MCV: 89.2 fL (ref 80.0–100.0)
Platelets: 640 10*3/uL — ABNORMAL HIGH (ref 150–400)
RBC: 3.52 MIL/uL — ABNORMAL LOW (ref 3.87–5.11)
RDW: 12 % (ref 11.5–15.5)
WBC: 14.2 10*3/uL — ABNORMAL HIGH (ref 4.0–10.5)
nRBC: 0 % (ref 0.0–0.2)

## 2019-07-21 MED ORDER — POTASSIUM CHLORIDE CRYS ER 20 MEQ PO TBCR
40.0000 meq | EXTENDED_RELEASE_TABLET | Freq: Once | ORAL | Status: AC
  Administered 2019-07-21: 40 meq via ORAL
  Filled 2019-07-21: qty 2

## 2019-07-21 MED ORDER — LINEZOLID 600 MG PO TABS
600.0000 mg | ORAL_TABLET | Freq: Two times a day (BID) | ORAL | Status: DC
  Administered 2019-07-21 – 2019-07-22 (×3): 600 mg via ORAL
  Filled 2019-07-21 (×4): qty 1

## 2019-07-21 NOTE — Progress Notes (Addendum)
PROGRESS NOTE                                                                                                                                                                                                             Patient Demographics:    Sally Buck, is a 59 y.o. female, DOB - 12-06-1960, HYQ:[id number]  Admit date - 07/13/2019   Admitting Physician Therisa Doyne, MD  Outpatient Primary MD for the patient is Marva Panda, NP  LOS - 8   Chief Complaint  Patient presents with  . Allergic Reaction       Brief Narrative    59 yo female with past medical history of recurrent pneumonia, GERD, and Hyperlipidemia who presented with a chief complaint of shortness of breath with associated chest and shoulder pain, progressive shortness of breath, fever and chills, patient reports she has had multiple episodes of pneumonia in the past ,On admission patient was seen febrile, tachypneic, tachycardic,Chest CT was then obtained that revealed significant for loculated pleural effusion vs empyema. IR was consulted and preformed US guided left thoracentesis that yielded of cloudy thick purulent-appearing fluid. Post thoracentesis patient continues to remain febrile with worsening leukocytosis, increased work of breathing, PCCM were consulted, recommendation for CT surgery evaluation and surgery.    Subjective:    Sally Buck today does report some cough, he is off PCA pump since yesterday, CT chest was discontinued yesterday, reports pain has improved, as well her appetite has improved as well .   Assessment  & Plan :   Acute hypoxic resp failute  Sepsis secondary to pneumonia loculated MRSA empyema. -Sepsis present on admission -Blood cultures no growth to date, postthoracentesis to 50 cc of cloudy thick purulent drainage, wit pleural effusion growing MRSA . -Patient with worsening sepsis, leukocytosis and increased work of breathing  secondary to her to loculated empyema, underwent for VATS, and empyema evacuation by CT surgery 5/20 . -Initially treated with broad-spectrum antibiotics namely vancomycin cefepime and Flagyl, subsequently transitioned to vancomycin alone and then to IV linezolid -Case was discussed with infectious disease, Dr. Algis Liming, recommendation of total of 2 weeks of antibiotic treatment, can continue with IV regimen during hospital stay, and upon discharge she can be discharged on oral doxycycline, given tetracycline allergy will consider oral Zyvox instead -Chest tube discontinued 5/23, she is today off  her fentanyl PCA. -Leukocytosis improving  Sinus tachycardia  -Patient remains significantly weak and frail, and with significant tachycardia with minimal activity -Could be secondary to beta agonists and increased metabolic rate from overall debility -CTA chest was negative for PE and Dopplers were negative for DVT -Monitor, ambulate later today -Discontinue albuterol (DuoNeb) and monitor  Chest pain/pressure -Check twelve-lead EKG and chest x-ray  Hyponatremia -Mild, symptomatic, continue to monitor  Hypomagnesemia -Repleted  History of Asthma/COPD -heavy tobacco use -No active wheezing   Code Status : Full  Family Communication  : None at bedside.  Disposition Plan  :  Status is: Inpatient  Remains inpatient appropriate because:Hemodynamically unstable   Dispo: The patient is from: Home              Anticipated d/c is to: Home              Anticipated d/c date is: 1-2 days              Patient currently is not medically stable to d/c.  With significant tachycardia, also chest pain today      Consults  :  PCCM , CT surgery  Procedures  :  -Thoracentesis by IR. -  VIDEO ASSISTED THORACOSCOPY (VATS)/DECORTICATION and Drainage of Empyema (Left)  5/20 by Dr. Vickey Sages  DVT Prophylaxis  : [place] Lovenox  Lab Results  Component Value Date   PLT 640 (H) 07/21/2019    Antibiotics   :    Anti-infectives (From admission, onward)   Start     Dose/Rate Route Frequency Ordered Stop   07/21/19 1100  linezolid (ZYVOX) tablet 600 mg     600 mg Oral Every 12 hours 07/21/19 1027 07/29/19 0959   07/19/19 2200  linezolid (ZYVOX) IVPB 600 mg  Status:  Discontinued     600 mg 300 mL/hr over 60 Minutes Intravenous Every 12 hours 07/19/19 1306 07/21/19 1027   07/19/19 1000  vancomycin (VANCOCIN) IVPB 500 mg/100 ml premix  Status:  Discontinued     500 mg 100 mL/hr over 60 Minutes Intravenous Every 12 hours 07/19/19 0610 07/19/19 1306   07/15/19 2200  vancomycin (VANCOREADY) IVPB 500 mg/100 mL  Status:  Discontinued     500 mg 100 mL/hr over 60 Minutes Intravenous Every 12 hours 07/15/19 0723 07/19/19 0608   07/15/19 1623  ceFAZolin (ANCEF) IVPB 2g/100 mL premix  Status:  Discontinued     2 g 200 mL/hr over 30 Minutes Intravenous 30 min pre-op 07/15/19 1623 07/21/19 1312   07/15/19 0800  metroNIDAZOLE (FLAGYL) IVPB 500 mg  Status:  Discontinued     500 mg 100 mL/hr over 60 Minutes Intravenous Every 8 hours 07/15/19 0711 07/17/19 1102   07/15/19 0800  vancomycin (VANCOCIN) IVPB 1000 mg/200 mL premix     1,000 mg 200 mL/hr over 60 Minutes Intravenous  Once 07/15/19 0723 07/15/19 1124   07/15/19 0800  ceFEPIme (MAXIPIME) 2 g in sodium chloride 0.9 % 100 mL IVPB  Status:  Discontinued     2 g 200 mL/hr over 30 Minutes Intravenous Every 12 hours 07/15/19 0723 07/17/19 1102   07/14/19 2200  cefTRIAXone (ROCEPHIN) 2 g in sodium chloride 0.9 % 100 mL IVPB  Status:  Discontinued     2 g 200 mL/hr over 30 Minutes Intravenous Every 24 hours 07/13/19 2352 07/15/19 0711   07/14/19 2000  azithromycin (ZITHROMAX) 500 mg in sodium chloride 0.9 % 250 mL IVPB  Status:  Discontinued     500  mg 250 mL/hr over 60 Minutes Intravenous Every 24 hours 07/13/19 2352 07/14/19 1559   07/14/19 0000  cefTRIAXone (ROCEPHIN) 2 g in sodium chloride 0.9 % 100 mL IVPB     2 g 200 mL/hr over 30 Minutes  Intravenous  Once 07/13/19 2357 07/14/19 0235   07/13/19 1715  levofloxacin (LEVAQUIN) IVPB 750 mg     750 mg 100 mL/hr over 90 Minutes Intravenous  Once 07/13/19 1706 07/13/19 1937        Objective:   Vitals:   07/21/19 0458 07/21/19 0726 07/21/19 0816 07/21/19 1333  BP: 135/74 (!) 146/82  139/68  Pulse: 97 99  (!) 109  Resp: (!) 22 (!) 27  20  Temp: 98.2 F (36.8 C) 98.5 F (36.9 C)  98.6 F (37 C)  TempSrc: Oral Oral  Oral  SpO2: 96% 94% 94% 94%  Weight:      Height:        Wt Readings from Last 3 Encounters:  07/19/19 48.7 kg  07/11/15 42 kg  10/21/14 42.2 kg    No intake or output data in the 24 hours ending 07/21/19 1401   Physical Exam General: Pleasant female sitting up in bed, AAOx3, no distress HEENT: No JVD CVS S1-S2 regular rate rhythm Lungs with diminished breath sounds at both bases, few scattered rhonchi Abdomen is soft nontender with positive bowel sounds Extremities no edema Skin no rash    Data Review:    CBC Recent Labs  Lab 07/17/19 0343 07/18/19 0417 07/19/19 0322 07/20/19 0829 07/21/19 0312  WBC 25.7* 18.7* 18.6* 16.5* 14.2*  HGB 12.5 12.9 11.4* 12.5 10.3*  HCT 38.1 39.3 34.9* 37.9 31.4*  PLT 423* 417* 447* 668* 640*  MCV 90.7 89.3 89.7 89.0 89.2  MCH 29.8 29.3 29.3 29.3 29.3  MCHC 32.8 32.8 32.7 33.0 32.8  RDW 12.0 12.0 12.1 12.0 12.0    Chemistries  Recent Labs  Lab 07/15/19 0426 07/15/19 0426 07/15/19 1730 07/16/19 0326 07/17/19 0343 07/18/19 0417 07/19/19 0322 07/20/19 0829 07/21/19 0312  NA 132*   < > 133*   < > 134* 134* 135 134* 131*  K 3.7   < > 4.1   < > 4.3 3.6 3.4* 3.7 3.3*  CL 99   < > 100   < > 100 98 97* 97* 92*  CO2 21*   < > 24   < > 22 25 26 28 27   GLUCOSE 123*   < > 154*   < > 68* 90 115* 138* 147*  BUN 9   < > 10   < > 12 8 7* <5* <5*  CREATININE 0.72   < > 0.64   < > 0.59 0.42* 0.43* 0.48 0.51  CALCIUM 8.4*   < > 8.1*   < > 8.4* 8.1* 8.1* 8.4* 8.1*  MG 2.1  --   --   --   --   --   --   --    --   AST 23  --  29  --  34  --   --   --   --   ALT 36  --  34  --  33  --   --   --   --   ALKPHOS 144*  --  135*  --  194*  --   --   --   --   BILITOT 1.3*  --  0.9  --  1.4*  --   --   --   --    < > =  values in this interval not displayed.   ------------------------------------------------------------------------------------------------------------------ No results for input(s): CHOL, HDL, LDLCALC, TRIG, CHOLHDL, LDLDIRECT in the last 72 hours.  No results found for: HGBA1C ------------------------------------------------------------------------------------------------------------------ No results for input(s): TSH, T4TOTAL, T3FREE, THYROIDAB in the last 72 hours.  Invalid input(s): FREET3 ------------------------------------------------------------------------------------------------------------------ No results for input(s): VITAMINB12, FOLATE, FERRITIN, TIBC, IRON, RETICCTPCT in the last 72 hours.  Coagulation profile Recent Labs  Lab 07/15/19 0426 07/15/19 1730  INR 1.3* 1.3*    No results for input(s): DDIMER in the last 72 hours.  Cardiac Enzymes No results for input(s): CKMB, TROPONINI, MYOGLOBIN in the last 168 hours.  Invalid input(s): CK ------------------------------------------------------------------------------------------------------------------ No results found for: BNP  Inpatient Medications  Scheduled Meds: . bisacodyl  10 mg Oral Daily  . docusate sodium  100 mg Oral BID  . enoxaparin (LOVENOX) injection  40 mg Subcutaneous Daily  . feeding supplement (ENSURE ENLIVE)  237 mL Oral BID BM  . latanoprost  1 drop Both Eyes QHS  . linezolid  600 mg Oral Q12H  . loratadine  10 mg Oral Daily  . mouth rinse  15 mL Mouth Rinse BID  . mometasone-formoterol  2 puff Inhalation BID  . pneumococcal 23 valent vaccine  0.5 mL Intramuscular Tomorrow-1000  . senna-docusate  1 tablet Oral QHS  . sodium chloride flush  3 mL Intravenous Q12H   Continuous  Infusions: . lactated ringers 10 mL/hr at 07/15/19 1206  . sodium chloride     PRN Meds:.acetaminophen **OR** [DISCONTINUED] acetaminophen, guaiFENesin-dextromethorphan, ipratropium-albuterol, levalbuterol, ondansetron **OR** ondansetron (ZOFRAN) IV, oxyCODONE, traMADol  Micro Results Recent Results (from the past 240 hour(s))  Culture, blood (Routine X 2) w Reflex to ID Panel     Status: None   Collection Time: 07/13/19  5:30 PM   Specimen: BLOOD RIGHT FOREARM  Result Value Ref Range Status   Specimen Description BLOOD RIGHT FOREARM  Final   Special Requests   Final    BOTTLES DRAWN AEROBIC AND ANAEROBIC Blood Culture adequate volume   Culture   Final    NO GROWTH 5 DAYS Performed at [facility], [address] [place], [place] [address]    Report Status 07/18/2019 FINAL  Final  Culture, blood (Routine X 2) w Reflex to ID Panel     Status: None   Collection Time: 07/13/19  5:30 PM   Specimen: BLOOD  Result Value Ref Range Status   Specimen Description BLOOD LEFT ANTECUBITAL  Final   Special Requests   Final    BOTTLES DRAWN AEROBIC AND ANAEROBIC Blood Culture results may not be optimal due to an inadequate volume of blood received in culture bottles   Culture   Final    NO GROWTH 5 DAYS Performed at [facility], [address], [place], [place] [address]    Report Status 07/18/2019 FINAL  Final  SARS Coronavirus 2 by RT PCR (hospital order, performed in [facility] hospital lab) Nasopharyngeal Nasopharyngeal Swab     Status: None   Collection Time: 07/13/19  5:41 PM   Specimen: Nasopharyngeal Swab  Result Value Ref Range Status   SARS Coronavirus 2 NEGATIVE NEGATIVE Final    Comment: (NOTE) SARS-CoV-2 target nucleic acids are NOT DETECTED. The SARS-CoV-2 RNA is generally detectable in upper and lower respiratory specimens during the acute phase of infection. The lowest concentration of SARS-CoV-2 viral copies this assay can detect is 250 copies / mL. A  negative result does not preclude SARS-CoV-2 infection and should not  be used as the sole basis for treatment or other patient management decisions.  A negative result may occur with improper specimen collection / handling, submission of specimen other than nasopharyngeal swab, presence of viral mutation(s) within the areas targeted by this assay, and inadequate number of viral copies (<250 copies / mL). A negative result must be combined with clinical observations, patient history, and epidemiological information. Fact Sheet for Patients:   BoilerBrush.com.cy Fact Sheet for Healthcare Providers: https://pope.com/ This test is not yet approved or cleared  by the [place] FDA and has been authorized for detection and/or diagnosis of SARS-CoV-2 by FDA under an Emergency Use Authorization (EUA).  This EUA will remain in effect (meaning this test can be used) for the duration of the COVID-19 declaration under Section 564(b)(1) of the Act, 21 U.S.C. section 360bbb-3(b)(1), unless the authorization is terminated or revoked sooner. Performed at [facility], [address], [place], [place] [address]   MRSA PCR Screening     Status: None   Collection Time: 07/14/19  7:30 AM   Specimen: Nasal Mucosa; Nasopharyngeal  Result Value Ref Range Status   MRSA by PCR NEGATIVE NEGATIVE Final    Comment:        The GeneXpert MRSA Assay (FDA approved for NASAL specimens only), is one component of a comprehensive MRSA colonization surveillance program. It is not intended to diagnose MRSA infection nor to guide or monitor treatment for MRSA infections. Performed at [facility], [address] [place], [place] [address]   Respiratory Panel by PCR     Status: None   Collection Time: 07/14/19  7:30 AM   Specimen: Nasal Mucosa; Respiratory  Result Value Ref Range Status   Adenovirus NOT DETECTED NOT DETECTED Final   Coronavirus 229E  NOT DETECTED NOT DETECTED Final    Comment: (NOTE) The Coronavirus on the Respiratory Panel, DOES NOT test for the novel  Coronavirus (2019 nCoV)    Coronavirus HKU1 NOT DETECTED NOT DETECTED Final   Coronavirus NL63 NOT DETECTED NOT DETECTED Final   Coronavirus OC43 NOT DETECTED NOT DETECTED Final   Metapneumovirus NOT DETECTED NOT DETECTED Final   Rhinovirus / Enterovirus NOT DETECTED NOT DETECTED Final   Influenza A NOT DETECTED NOT DETECTED Final   Influenza B NOT DETECTED NOT DETECTED Final   Parainfluenza Virus 1 NOT DETECTED NOT DETECTED Final   Parainfluenza Virus 2 NOT DETECTED NOT DETECTED Final   Parainfluenza Virus 3 NOT DETECTED NOT DETECTED Final   Parainfluenza Virus 4 NOT DETECTED NOT DETECTED Final   Respiratory Syncytial Virus NOT DETECTED NOT DETECTED Final   Bordetella pertussis NOT DETECTED NOT DETECTED Final   Chlamydophila pneumoniae NOT DETECTED NOT DETECTED Final   Mycoplasma pneumoniae NOT DETECTED NOT DETECTED Final    Comment: Performed at [facility], [address] [place], [place] [address]  Gram stain     Status: None   Collection Time: 07/14/19  8:46 AM   Specimen: Lung, Left; Pleural Fluid  Result Value Ref Range Status   Specimen Description PLEURAL FLUID  Final   Special Requests LEFT LUNG  Final   Gram Stain   Final    WBC PRESENT,BOTH PMN AND MONONUCLEAR GRAM POSITIVE COCCI CYTOSPIN SMEAR Gram Stain Report Called to,Read Back By and Verified With: Elfredia Nevins RN [id number] 07/14/19 A BROWNING Performed at [facility], [address], [place], [place] [address]    Report Status 07/14/2019 FINAL  Final  Culture, body  fluid-bottle     Status: Abnormal   Collection Time: 07/14/19  8:46 AM   Specimen: Pleura  Result Value Ref Range Status   Specimen Description PLEURAL FLUID  Final   Special Requests LEFT LUNG  Final   Gram Stain   Final    IN BOTH AEROBIC AND ANAEROBIC BOTTLES GRAM POSITIVE COCCI IN CLUSTERS CRITICAL RESULT CALLED  TO, READ BACK BY AND VERIFIED WITH: RN R SANTIAGO [id number] AT 908 AM BY CM    Culture (A)  Final    METHICILLIN RESISTANT STAPHYLOCOCCUS AUREUS CRITICAL RESULT CALLED TO, READ BACK BY AND VERIFIED WITH: RN R SANTIAGO [id number] AR 908 AM BY CM Performed at [facility], [place] [address] [place], [place] [address]    Report Status 07/16/2019 FINAL  Final   Organism ID, Bacteria METHICILLIN RESISTANT STAPHYLOCOCCUS AUREUS  Final      Susceptibility   Methicillin resistant staphylococcus aureus - MIC*    CIPROFLOXACIN >=8 RESISTANT Resistant     ERYTHROMYCIN >=8 RESISTANT Resistant     GENTAMICIN <=0.5 SENSITIVE Sensitive     OXACILLIN >=4 RESISTANT Resistant     TETRACYCLINE <=1 SENSITIVE Sensitive     VANCOMYCIN 1 SENSITIVE Sensitive     TRIMETH/SULFA <=10 SENSITIVE Sensitive     CLINDAMYCIN <=0.25 SENSITIVE Sensitive     RIFAMPIN <=0.5 SENSITIVE Sensitive     Inducible Clindamycin NEGATIVE Sensitive     * METHICILLIN RESISTANT STAPHYLOCOCCUS AUREUS  Anaerobic culture     Status: None   Collection Time: 07/15/19  2:03 PM   Specimen: Pleural, Left; Body Fluid  Result Value Ref Range Status   Specimen Description FLUID LEFT PLEURAL  Final   Special Requests NONE  Final   Gram Stain   Final    ABUNDANT WBC PRESENT,BOTH PMN AND MONONUCLEAR NO ORGANISMS SEEN    Culture   Final    NO ANAEROBES ISOLATED Performed at [facility], [address] [place], [place] [address]    Report Status 07/20/2019 FINAL  Final  Anaerobic culture     Status: None   Collection Time: 07/15/19  2:03 PM   Specimen: Pleural, Left; Body Fluid  Result Value Ref Range Status   Specimen Description FLUID LEFT PLEURAL  Final   Special Requests PEEL  Final   Gram Stain   Final    ABUNDANT WBC PRESENT,BOTH PMN AND MONONUCLEAR RARE GRAM POSITIVE COCCI    Culture   Final    NO ANAEROBES ISOLATED Performed at [facility], [place] [address] [place], [place] [address]    Report Status 07/20/2019  FINAL  Final  Body fluid culture     Status: None   Collection Time: 07/15/19  2:03 PM   Specimen: Pleural, Left; Body Fluid  Result Value Ref Range Status   Specimen Description FLUID LEFT PLEURAL  Final   Special Requests PEEL  Final   Gram Stain   Final    ABUNDANT WBC PRESENT,BOTH PMN AND MONONUCLEAR RARE GRAM POSITIVE COCCI Performed at [facility], [place] [address] [place],  [address]    Culture FEW METHICILLIN RESISTANT STAPHYLOCOCCUS AUREUS  Final   Report Status 07/17/2019 FINAL  Final   Organism ID, Bacteria METHICILLIN RESISTANT STAPHYLOCOCCUS AUREUS  Final      Susceptibility   Methicillin resistant staphylococcus aureus - MIC*    CIPROFLOXACIN >=8 RESISTANT Resistant     ERYTHROMYCIN >=8 RESISTANT Resistant     GENTAMICIN <=0.5 SENSITIVE Sensitive  OXACILLIN >=4 RESISTANT Resistant     TETRACYCLINE <=1 SENSITIVE Sensitive     VANCOMYCIN 1 SENSITIVE Sensitive     TRIMETH/SULFA <=10 SENSITIVE Sensitive     CLINDAMYCIN <=0.25 SENSITIVE Sensitive     RIFAMPIN <=0.5 SENSITIVE Sensitive     Inducible Clindamycin NEGATIVE Sensitive     * FEW METHICILLIN RESISTANT STAPHYLOCOCCUS AUREUS  Body fluid culture     Status: Abnormal   Collection Time: 07/15/19  2:03 PM   Specimen: Fluid  Result Value Ref Range Status   Specimen Description FLUID LEFT PLEURAL  Final   Special Requests NONE  Final   Gram Stain   Final    ABUNDANT WBC PRESENT,BOTH PMN AND MONONUCLEAR NO ORGANISMS SEEN    Culture (A)  Final    METHICILLIN RESISTANT STAPHYLOCOCCUS AUREUS SUSCEPTIBILITIES PERFORMED ON PREVIOUS CULTURE WITHIN THE LAST 5 DAYS. Performed at [facility], [address], [place], [place] [address]    Report Status 07/18/2019 FINAL  Final   Organism ID, Bacteria METHICILLIN RESISTANT STAPHYLOCOCCUS AUREUS  Final      Susceptibility   Methicillin resistant staphylococcus aureus - MIC*    CIPROFLOXACIN >=8 RESISTANT Resistant     ERYTHROMYCIN >=8 RESISTANT  Resistant     GENTAMICIN <=0.5 SENSITIVE Sensitive     OXACILLIN >=4 RESISTANT Resistant     TETRACYCLINE <=1 SENSITIVE Sensitive     VANCOMYCIN 1 SENSITIVE Sensitive     TRIMETH/SULFA <=10 SENSITIVE Sensitive     CLINDAMYCIN <=0.25 SENSITIVE Sensitive     RIFAMPIN <=0.5 SENSITIVE Sensitive     Inducible Clindamycin NEGATIVE Sensitive     * METHICILLIN RESISTANT STAPHYLOCOCCUS AUREUS    Radiology Reports DG Chest 1 View  Result Date: 07/14/2019 CLINICAL DATA:  Status post thoracentesis on the left EXAM: CHEST  1 VIEW COMPARISON:  07/13/2019 FINDINGS: Cardiac shadow is stable. Slight reduction in pleural fluid is noted when compare with the prior exam. No pneumothorax is seen. Slight increased density is noted throughout the left lung which may be related to some re-expansion edema. No bony abnormality is noted. IMPRESSION: No pneumothorax following thoracentesis. There are however changes suggestive of re-expansion edema. Electronically Signed   By: Alcide Clever M.D.   On: 07/14/2019 08:54   DG Chest 2 View  Result Date: 07/19/2019 CLINICAL DATA:  Pleural effusion on left EXAM: CHEST - 2 VIEW COMPARISON:  07/18/2019 FINDINGS: Interval removal of left chest tubes. No pneumothorax. Otherwise stable appearance of the left hemithorax. No confluent opacity on the right. Heart is normal size. IMPRESSION: Interval removal of left chest tubes without pneumothorax. Otherwise no change. Electronically Signed   By: Charlett Nose M.D.   On: 07/19/2019 07:37   DG Chest 2 View  Result Date: 07/13/2019 CLINICAL DATA:  Chest pain, shortness of breath. EXAM: CHEST - 2 VIEW COMPARISON:  October 21, 2014. FINDINGS: The heart size and mediastinal contours are within normal limits. No pneumothorax is noted. Right lung is clear. Mild left pleural effusion is noted with associated left basilar infiltrate or atelectasis. The visualized skeletal structures are unremarkable. IMPRESSION: Mild left pleural effusion is  noted with associated left basilar infiltrate or atelectasis. Electronically Signed   By: Lupita Raider M.D.   On: 07/13/2019 16:47   CT CHEST WO CONTRAST  Result Date: 07/14/2019 CLINICAL DATA:  And pain in a status post thoracentesis EXAM: CT CHEST WITHOUT CONTRAST TECHNIQUE: Multidetector CT imaging of the chest was performed following the standard protocol without IV contrast.  COMPARISON:  07/13/2019 FINDINGS: Cardiovascular: Heart is unremarkable without pericardial effusion. Grossly normal caliber of the thoracic aorta. Evaluation limited without intravenous contrast. Mediastinum/Nodes: Evaluation is limited without IV contrast. The thyroid, trachea, and esophagus are grossly unremarkable. No discrete adenopathy. Lungs/Pleura: Since the prior CT, there is been significant increase in the size of the multilocular complex left pleural effusion. There is progressive consolidation throughout the left lung, with relative sparing of the left apex. No left-sided pneumothorax after thoracentesis. There is a small free-flowing right pleural effusion. Patchy consolidation right lower lobe likely atelectasis. The central airways are patent. Upper Abdomen: No acute abnormality. Musculoskeletal: No acute or destructive bony lesions. Reconstructed images demonstrate no additional findings. IMPRESSION: 1. Enlarging multilocular complex left pleural effusion, compatible with empyema. 2. Progressive left lung consolidation with relative sparing of the left apex. This could reflect underlying pneumonia. 3. Trace free-flowing right pleural effusion, new since prior study. Electronically Signed   By: Sharlet SalinaMichael  Brown M.D.   On: 07/14/2019 20:08   CT Angio Chest PE W and/or Wo Contrast  Result Date: 07/13/2019 CLINICAL DATA:  Shortness of breath with left-sided chest pain. EXAM: CT ANGIOGRAPHY CHEST WITH CONTRAST TECHNIQUE: Multidetector CT imaging of the chest was performed using the standard protocol during bolus  administration of intravenous contrast. Multiplanar CT image reconstructions and MIPs were obtained to evaluate the vascular anatomy. CONTRAST:  75mL OMNIPAQUE IOHEXOL 350 MG/ML SOLN COMPARISON:  None. FINDINGS: Cardiovascular: Heart size normal. Small pericardial effusion evident. No thoracic aortic aneurysm. No filling defects within the opacified pulmonary arteries to suggest the presence of an acute pulmonary embolus Mediastinum/Nodes: 11 mm short axis AP window lymph node visible on image 44/5 nodular soft tissue is identified in the anterior mediastinum, potentially related to small clustered lymph nodes. No right hilar lymphadenopathy. There is abnormal soft tissue attenuation in the left hilum. There is no axillary lymphadenopathy. Lungs/Pleura: Ground-glass opacity is seen fairly diffusely in the left upper lobe with diffuse consolidation of the lingula with airspace consolidation tracking centrally into the left hilum. There is left lower lobe collapse inferiorly. These changes are associated with a moderate left pleural effusion demonstrating loculation in the apex and anterior upper hemithorax. Nodular pleural thickening or loculated fluid is seen along the medial pleura adjacent to the transverse aorta. No right pleural effusion. Upper Abdomen: Unremarkable. Musculoskeletal: No worrisome lytic or sclerotic osseous abnormality. Review of the MIP images confirms the above findings. IMPRESSION: 1. No CT evidence for acute pulmonary embolus. 2. Diffuse ground-glass opacity in the left upper lobe with diffuse consolidation of the lingula tracking centrally into the left hilum. This is associated with a moderate left pleural effusion demonstrating loculation and prominent areas of left lower lobe atelectasis. Nodular pleural thickening or loculated fluid is identified along the medial pleura adjacent to the transverse aorta. Mildly enlarged AP window lymph node with question of soft tissue  nodularity/clustered lymph nodes in the anterior mediastinum. These changes may all be related to pneumonia, given the apparent pleural nodularity towards the apex, neoplasm is a distinct concern. Close follow-up recommended. Electronically Signed   By: Kennith CenterEric  Mansell M.D.   On: 07/13/2019 20:44   DG CHEST PORT 1 VIEW  Result Date: 07/18/2019 CLINICAL DATA:  Evaluate empyema EXAM: PORTABLE CHEST 1 VIEW COMPARISON:  07/17/2019 FINDINGS: Cardiac shadow is stable. Two chest tubes are again seen on the left. The overall appearance of the left hemithorax is stable. No pneumothorax is noted. Diffuse opacification throughout the lung is seen with multiple  air bronchograms as well as persistent empyema. Right lung remains clear. Bony structures are unremarkable. IMPRESSION: Stable appearance of the chest when compared with the previous day. Electronically Signed   By: Alcide Clever M.D.   On: 07/18/2019 08:46   DG CHEST PORT 1 VIEW  Result Date: 07/17/2019 CLINICAL DATA:  Follow-up empyema EXAM: PORTABLE CHEST 1 VIEW COMPARISON:  07/16/2019 FINDINGS: Cardiac shadow is stable. Diffuse infiltrate is noted throughout the left lung. Two left thoracostomy catheters are noted in place. Some persistent pleural fluid is noted consistent with the given clinical history. No pneumothorax is seen. Right lung remains clear. IMPRESSION: Stable appearance of the chest when compare with the prior exam. Electronically Signed   By: Alcide Clever M.D.   On: 07/17/2019 09:59   DG CHEST PORT 1 VIEW  Result Date: 07/16/2019 CLINICAL DATA:  Status post VATS and decortication on the left on 07/15/2019. EXAM: PORTABLE CHEST 1 VIEW COMPARISON:  Yesterday. FINDINGS: Two left chest tubes remain in place. No pneumothorax. Diffuse airspace opacity in the left lung with mild improvement. Possible minimal lateral and apical pleural fluid or thickening on the left. Interval minimal patchy opacity in the right lower lung zone. Stable mild  peribronchial thickening on the right. Grossly normal sized heart. Decreased subcutaneous air on the left. Unremarkable bones. Gas distended bowel in the left upper abdomen. IMPRESSION: 1. No pneumothorax. 2. Diffuse left lung pneumonia and/or pulmonary edema, mildly improved. 3. Interval minimal patchy atelectasis or pneumonia in the right lower lung zone. 4. Decreased subcutaneous air on the left. Electronically Signed   By: Beckie Salts M.D.   On: 07/16/2019 08:51   DG Chest Port 1 View  Result Date: 07/15/2019 CLINICAL DATA:  Post lung surgery follow-up. EXAM: PORTABLE CHEST 1 VIEW COMPARISON:  07/15/2019 FINDINGS: Examination demonstrates adequate lung volumes with 2 left-sided chest tubes present. Mild opacification throughout the left lung with improved aeration compared to the prior exam. Right lung is clear. Cardiomediastinal silhouette is within normal. Mild subcutaneous emphysema over the lower left flank. Remainder the exam is unchanged. IMPRESSION: Hazy opacification over the left lung with interval improved aeration post placement 2 left-sided chest tubes. Electronically Signed   By: Elberta Fortis M.D.   On: 07/15/2019 14:50   DG Chest Port 1 View  Result Date: 07/15/2019 CLINICAL DATA:  Empyema. EXAM: PORTABLE CHEST 1 VIEW COMPARISON:  CT 07/14/2019.  Chest x-ray 07/14/2019. FINDINGS: Complete opacification left hemithorax is noted on today's exam. This is most consistent with reaccumulation of left pleural fluid/progressive empyema. Underlying pulmonary infiltrate cannot be excluded. No pneumothorax. Heart size cannot be evaluated no acute bony abnormality. IMPRESSION: Complete opacification left hemithorax is noted on today's exam. This is most consistent with reaccumulation of left pleural fluid/progressive empyema. Electronically Signed   By: Maisie Fus  Register   On: 07/15/2019 08:40   ECHOCARDIOGRAM COMPLETE  Result Date: 07/15/2019    ECHOCARDIOGRAM REPORT   Patient Name:   KAILEENA OBI Date of Exam: 07/15/2019 Medical Rec #:  [id number]              Height:       57.0 in Accession #:    [id number]             Weight:       95.8 lb Date of Birth:  06/12/1960               BSA:          1.315 m Patient  Age:    59 years               BP:           143/90 mmHg Patient Gender: F                      HR:           120 bpm. Exam Location:  Inpatient Procedure: 2D Echo Indications:    Elevated Troponin  History:        Patient has no prior history of Echocardiogram examinations.                 Signs/Symptoms:Shortness of Breath. GERD. Shortness of breath                 with associated chest and shoulder pain, fever. Sepsis secondary                 to pneumonia, with loculated empyema.  Sonographer:    Leta Jungling RDCS Referring Phys: [id number] ANASTASSIA DOUTOVA IMPRESSIONS  1. Left ventricular ejection fraction, by estimation, is 55%. The left ventricle has normal function. The left ventricle has no regional wall motion abnormalities. Left ventricular diastolic parameters are indeterminate.  2. Right ventricular systolic function is normal. The right ventricular size is normal. Tricuspid regurgitation signal is inadequate for assessing PA pressure.  3. The mitral valve is normal in structure. Trivial mitral valve regurgitation. No evidence of mitral stenosis.  4. The aortic valve is normal in structure. Aortic valve regurgitation is not visualized. No aortic stenosis is present.  5. Dilated pulmonary artery.  6. The inferior vena cava is normal in size with greater than 50% respiratory variability, suggesting right atrial pressure of 3 mmHg. FINDINGS  Left Ventricle: Left ventricular ejection fraction, by estimation, is 55%. The left ventricle has normal function. The left ventricle has no regional wall motion abnormalities. The left ventricular internal cavity size was normal in size. There is no left ventricular hypertrophy. Left ventricular diastolic parameters are indeterminate. Right  Ventricle: The right ventricular size is normal. No increase in right ventricular wall thickness. Right ventricular systolic function is normal. Tricuspid regurgitation signal is inadequate for assessing PA pressure. Left Atrium: Left atrial size was normal in size. Right Atrium: Right atrial size was normal in size. Pericardium: There is no evidence of pericardial effusion. Mitral Valve: The mitral valve is normal in structure. Normal mobility of the mitral valve leaflets. Trivial mitral valve regurgitation. No evidence of mitral valve stenosis. Tricuspid Valve: The tricuspid valve is normal in structure. Tricuspid valve regurgitation is not demonstrated. No evidence of tricuspid stenosis. Aortic Valve: The aortic valve is normal in structure. Aortic valve regurgitation is not visualized. No aortic stenosis is present. Pulmonic Valve: The pulmonic valve was normal in structure. Pulmonic valve regurgitation is not visualized. No evidence of pulmonic stenosis. Aorta: The aortic root is normal in size and structure. Pulmonary Artery: The pulmonary artery is dilated. Venous: The inferior vena cava is normal in size with greater than 50% respiratory variability, suggesting right atrial pressure of 3 mmHg. IAS/Shunts: There is redundancy of the interatrial septum. No atrial level shunt detected by color flow Doppler.  LEFT VENTRICLE PLAX 2D LVIDd:         3.70 cm LVIDs:         2.70 cm LV PW:         0.70 cm LV IVS:        0.90  cm LVOT diam:     1.90 cm LV SV:         34 LV SV Index:   26 LVOT Area:     2.84 cm  LV Volumes (MOD) LV vol d, MOD A2C: 73.8 ml LV vol d, MOD A4C: 71.2 ml LV vol s, MOD A2C: 34.9 ml LV vol s, MOD A4C: 36.1 ml LV SV MOD A2C:     38.9 ml LV SV MOD A4C:     71.2 ml LV SV MOD BP:      37.4 ml RIGHT VENTRICLE RV S prime:     17.70 cm/s TAPSE (M-mode): 1.9 cm LEFT ATRIUM             Index       RIGHT ATRIUM          Index LA diam:        3.00 cm 2.28 cm/m  RA Area:     7.41 cm LA Vol (A2C):   26.7  ml 20.30 ml/m RA Volume:   12.50 ml 9.50 ml/m LA Vol (A4C):   23.3 ml 17.71 ml/m LA Biplane Vol: 27.2 ml 20.68 ml/m  AORTIC VALVE LVOT Vmax:   89.50 cm/s LVOT Vmean:  61.600 cm/s LVOT VTI:    0.120 m  AORTA Ao Root diam: 2.90 cm Ao Asc diam:  2.70 cm  SHUNTS Systemic VTI:  0.12 m Systemic Diam: 1.90 cm Weston Brass MD Electronically signed by Weston Brass MD Signature Date/Time: 07/15/2019/12:54:26 PM    Final    VAS [place] LOWER EXTREMITY VENOUS (DVT)  Result Date: 07/15/2019  Lower Venous DVTStudy Indications: Elevated ddimer.  Comparison Study: no prior Performing Technologist: Blanch Media RVS  Examination Guidelines: A complete evaluation includes B-mode imaging, spectral Doppler, color Doppler, and power Doppler as needed of all accessible portions of each vessel. Bilateral testing is considered an integral part of a complete examination. Limited examinations for reoccurring indications may be performed as noted. The reflux portion of the exam is performed with the patient in reverse Trendelenburg.  +---------+---------------+---------+-----------+----------+--------------+ RIGHT    CompressibilityPhasicitySpontaneityPropertiesThrombus Aging +---------+---------------+---------+-----------+----------+--------------+ CFV      Full           Yes      Yes                                 +---------+---------------+---------+-----------+----------+--------------+ SFJ      Full                                                        +---------+---------------+---------+-----------+----------+--------------+ FV Prox  Full                                                        +---------+---------------+---------+-----------+----------+--------------+ FV Mid   Full                                                        +---------+---------------+---------+-----------+----------+--------------+  FV DistalFull                                                         +---------+---------------+---------+-----------+----------+--------------+ PFV      Full                                                        +---------+---------------+---------+-----------+----------+--------------+ POP      Full           Yes      Yes                                 +---------+---------------+---------+-----------+----------+--------------+ PTV      Full                                                        +---------+---------------+---------+-----------+----------+--------------+ PERO     Full                                                        +---------+---------------+---------+-----------+----------+--------------+   +---------+---------------+---------+-----------+----------+--------------+ LEFT     CompressibilityPhasicitySpontaneityPropertiesThrombus Aging +---------+---------------+---------+-----------+----------+--------------+ CFV      Full           Yes      Yes                                 +---------+---------------+---------+-----------+----------+--------------+ SFJ      Full                                                        +---------+---------------+---------+-----------+----------+--------------+ FV Prox  Full                                                        +---------+---------------+---------+-----------+----------+--------------+ FV Mid   Full                                                        +---------+---------------+---------+-----------+----------+--------------+ FV DistalFull                                                        +---------+---------------+---------+-----------+----------+--------------+  PFV      Full                                                        +---------+---------------+---------+-----------+----------+--------------+ POP      Full           Yes      Yes                                  +---------+---------------+---------+-----------+----------+--------------+ PTV      Full                                                        +---------+---------------+---------+-----------+----------+--------------+ PERO     Full                                                        +---------+---------------+---------+-----------+----------+--------------+     Summary: BILATERAL: - No evidence of deep vein thrombosis seen in the lower extremities, bilaterally. - RIGHT: - A cystic structure is found in the popliteal fossa.  LEFT: - No cystic structure found in the popliteal fossa.  *See table(s) above for measurements and observations. Electronically signed by Sherald Hess MD on 07/15/2019 at 5:26:31 PM.    Final    IR THORACENTESIS ASP PLEURAL SPACE W/IMG GUIDE  Result Date: 07/14/2019 INDICATION: Patient with history of recurrent pneumonia, presents with shortness of breath and left pleural effusion. Request is made for diagnostic and therapeutic left thoracentesis. EXAM: ULTRASOUND GUIDED DIAGNOSTIC AND THERAPEUTIC LEFT THORACENTESIS MEDICATIONS: 10 mL 1% lidocaine COMPLICATIONS: SIR Level A - No therapy, no consequence. PROCEDURE: An ultrasound guided thoracentesis was thoroughly discussed with the patient and questions answered. The benefits, risks, alternatives and complications were also discussed. The patient understands and wishes to proceed with the procedure. Written consent was obtained. Ultrasound was performed to localize and mark an adequate pocket of fluid in the left chest. The area was then prepped and draped in the normal sterile fashion. 1% Lidocaine was used for local anesthesia. Under ultrasound guidance a 6 Fr Safe-T-Centesis catheter was introduced. Thoracentesis was performed. The catheter was removed and a dressing applied. FINDINGS: A total of approximately 250 mL of cloudy, purulent-appearing fluid was removed. Samples were sent to the laboratory as requested  by the clinical team. Postprocedure chest x-ray shows show suggestion of re-expansion edema. IMPRESSION: Successful ultrasound guided diagnostic and therapeutic thoracentesis yielding 250 mL of pleural fluid. Read by: Loyce Dys PA-C Electronically Signed   By: Gilmer Mor D.O.   On: 07/14/2019 11:36   Zannie Cove M.D on 07/21/2019 at 2:01 PM   [facility] -

## 2019-07-21 NOTE — Progress Notes (Signed)
Patient complaining of chest pressure. Dr. Jomarie Longs notified. EKG completed. Will continue to monitor Sally Otto, RN

## 2019-07-21 NOTE — Progress Notes (Signed)
Mobility Specialist: Progress Note   Pre-Mobility: 117 HR, 93% SpO2 Post-Mobility: 120 HR, 92% SpO2  Pt tolerated ambulation well. Pt c/o getting tired towards the end of ambulation. Pt experience SOB after ambulation and the nurse was notified, resolved quickly. Pt was positioned in bed with family member in room.    [facility] Jeston Junkins Mobility Specialist

## 2019-07-21 NOTE — Progress Notes (Addendum)
Physical Therapy Treatment Patient Details Name: Sally Buck MRN: [id number] DOB: 06/09/1960 Today's Date: 07/21/2019    History of Present Illness Patient is a 59 year old female admitted with cough that has progressively been getting worse.  Pt dx with left empyema s/p VATs 5/20.PMhx: asthma    PT Comments    Pt very pleasant and moving well this session. Pt with increased gait tolerance maintaining SpO2 >90% and HR 111-119. Pt reports eager to return home and that she has been moving independently in room. Encouraged continued hall and room ambulation for return home. Cues for pursed lip breathing and energy conservation.      Follow Up Recommendations  No PT follow up     Equipment Recommendations  None recommended by PT    Recommendations for Other Services       Precautions / Restrictions Precautions Precautions: None Restrictions Weight Bearing Restrictions: No    Mobility  Bed Mobility Overal bed mobility: Modified Independent Bed Mobility: Supine to Sit;Sit to Supine     Supine to sit: Modified independent (Device/Increase time) Sit to supine: Modified independent (Device/Increase time)      Transfers Overall transfer level: Modified independent                  Ambulation/Gait Ambulation/Gait assistance: Supervision Gait Distance (Feet): 400 Feet Assistive device: None Gait Pattern/deviations: Step-through pattern;Decreased stride length   Gait velocity interpretation: 1.31 - 2.62 ft/sec, indicative of limited community ambulator General Gait Details: pt with increased gait tolerance with slow speed and HR 111-119 with SpO2 90-92% on RA   Stairs             Wheelchair Mobility    Modified Rankin (Stroke Patients Only)       Balance Overall balance assessment: No apparent balance deficits (not formally assessed)                                          Cognition Arousal/Alertness:  Awake/alert Behavior During Therapy: WFL for tasks assessed/performed Overall Cognitive Status: Within Functional Limits for tasks assessed                                        Exercises      General Comments        Pertinent Vitals/Pain Pain Assessment: No/denies pain    Home Living                      Prior Function            PT Goals (current goals can now be found in the care plan section) Progress towards PT goals: Progressing toward goals    Frequency    Min 3X/week      PT Plan Current plan remains appropriate    Co-evaluation              AM-PAC PT "6 Clicks" Mobility   Outcome Measure  Help needed turning from your back to your side while in a flat bed without using bedrails?: None Help needed moving from lying on your back to sitting on the side of a flat bed without using bedrails?: None Help needed moving to and from a bed to a chair (including a wheelchair)?: None Help needed standing up from  a chair using your arms (e.g., wheelchair or bedside chair)?: None Help needed to walk in hospital room?: A Little Help needed climbing 3-5 steps with a railing? : A Little 6 Click Score: 22    End of Session   Activity Tolerance: Patient tolerated treatment well Patient left: in bed;with call bell/phone within reach Nurse Communication: Mobility status PT Visit Diagnosis: Other abnormalities of gait and mobility (R26.89);Difficulty in walking, not elsewhere classified (R26.2)     Time: 2074-0979 PT Time Calculation (min) (ACUTE ONLY): 13 min  Charges:  $Gait Training: 8-22 mins                     Sally Buck, PT Acute Rehabilitation Services Pager: [phone] Office: [phone]    Sally Buck Sally Buck 07/21/2019, 9:48 AM

## 2019-07-22 DIAGNOSIS — J869 Pyothorax without fistula: Secondary | ICD-10-CM | POA: Diagnosis not present

## 2019-07-22 LAB — BASIC METABOLIC PANEL
Anion gap: 11 (ref 5–15)
BUN: <5 mg/dL — ABNORMAL LOW (ref 8–23)
CO2: 25 mmol/L (ref 22–32)
Calcium: 8.3 mg/dL — ABNORMAL LOW (ref 8.9–10.3)
Chloride: 96 mmol/L — ABNORMAL LOW (ref 98–111)
Creatinine, Ser: 0.5 mg/dL (ref 0.44–1.00)
GFR calc Af Amer: >60 mL/min (ref >60)
GFR calc non Af Amer: >60 mL/min (ref >60)
Glucose, Bld: 117 mg/dL — ABNORMAL HIGH (ref 70–99)
Potassium: 4.3 mmol/L (ref 3.5–5.1)
Sodium: 132 mmol/L — ABNORMAL LOW (ref 135–145)

## 2019-07-22 LAB — CBC
HCT: 31.5 % — ABNORMAL LOW (ref 36.0–46.0)
Hemoglobin: 10.2 g/dL — ABNORMAL LOW (ref 12.0–15.0)
MCH: 29.1 pg (ref 26.0–34.0)
MCHC: 32.4 g/dL (ref 30.0–36.0)
MCV: 90 fL (ref 80.0–100.0)
Platelets: 739 10*3/uL — ABNORMAL HIGH (ref 150–400)
RBC: 3.5 MIL/uL — ABNORMAL LOW (ref 3.87–5.11)
RDW: 12.2 % (ref 11.5–15.5)
WBC: 12.8 10*3/uL — ABNORMAL HIGH (ref 4.0–10.5)
nRBC: 0 % (ref 0.0–0.2)

## 2019-07-22 MED ORDER — LINEZOLID 600 MG PO TABS: 600.0000 mg | ORAL_TABLET | Freq: Two times a day (BID) | ORAL | 0 refills | Status: DC

## 2019-07-22 MED ORDER — HYDROCODONE-ACETAMINOPHEN 5-325 MG PO TABS: 1.0000 | ORAL_TABLET | Freq: Four times a day (QID) | ORAL | 0 refills | Status: DC | PRN

## 2019-07-22 MED ORDER — LINEZOLID 600 MG PO TABS: 600.0000 mg | ORAL_TABLET | Freq: Two times a day (BID) | ORAL | 0 refills | Status: AC

## 2019-07-22 MED ORDER — HYDROCODONE-ACETAMINOPHEN 5-325 MG PO TABS: 1.0000 | ORAL_TABLET | Freq: Four times a day (QID) | ORAL | 0 refills | Status: AC | PRN

## 2019-07-22 MED FILL — LINEZOLID 600 MG TABS: 600 | 7 days supply | Qty: 14 | Fill #0

## 2019-07-22 NOTE — Discharge Summary (Signed)
Physician Discharge Summary  Sally Buck ZOX:[id number] DOB: 21-Dec-1960 DOA: 07/13/2019  PCP: Marva Panda, NP  Admit date: 07/13/2019 Discharge date: 07/22/2019  Time spent: 35 minutes  Recommendations for Outpatient Follow-up:  CT surgery Dr. Vickey Sages on 6/7 with x-ray Pulmonary in 1 month, will benefit from pulmonary function tests  Discharge Diagnoses:  MRSA empyema   CAP (community acquired pneumonia)   Empyema lung (HCC)   Tachycardia   Sepsis (HCC)   Dehydration   Hyponatremia  Asthma/possible COPD  Former Smoker  Discharge Condition: stable  Diet recommendation: heart healthy  Filed Weights   07/13/19 1558 07/19/19 0500  Weight: 43.5 kg 48.7 kg    History of present illness:  59 yo female with past medical history of recurrent pneumonia, GERD, and Hyperlipidemia who presented with a chief complaint of shortness of breath with associated chest and shoulder pain, progressive shortness of breath, fever and chills, patient reports she has had multiple episodes of pneumonia in the past ,On admission patient was seen febrile, tachypneic, tachycardic,Chest CT was then obtained that revealed significant for loculated pleural effusion vs empyema  Hospital Course:   Acute hypoxic resp failute  Sepsis secondary to pneumonia loculated MRSA empyema. -Sepsis present on admission -Blood cultures no growth to date, postthoracentesis to 50 cc of cloudy thick purulent drainage, wit pleural effusion growing MRSA . -Patient with worsening sepsis, leukocytosis and increased work of breathing secondary to her to loculated empyema, underwent for VATS, and empyema evacuation by CT surgery 5/20 . -Initially treated with broad-spectrum antibiotics namely vancomycin cefepime and Flagyl, subsequently transitioned to vancomycin alone and then to IV linezolid -Case was discussed with infectious disease, Dr. Algis Liming, recommendation of total of 2 weeks of antibiotic treatment, can  continue with IV regimen during hospital stay, and upon discharge she can be discharged on oral doxycycline, given tetracycline allergy we used oral Zyvox instead -Chest tube discontinued 5/23, she is off her fentanyl PCA. -Leukocytosis improving -Repeat x-ray today reviewed with CT surgery -She is clinically improved and medically stable for discharge today, follow-up with Dr. Vickey Sages on 6/7 with x-ray  Sinus tachycardia  -Could be secondary to beta agonists and increased metabolic rate from overall debility -CTA chest was negative for PE and Dopplers were negative for DVT -Improved, heart rate still in the low 100 range but she is asymptomatic from this   Hyponatremia -Mild, symptomatic, continue to monitor  Hypomagnesemia -Repleted  History of Asthma/COPD -h/o heavy tobacco use -No active wheezing   Procedures: Procedure(s) and Anesthesia Type:    * VIDEO ASSISTED THORACOSCOPY (VATS)/DECORTICATION and Drainage of Empyema - General  Surgeon(s) and Role:    Linden Dolin, MD - Primary  Consultations:  CT surgery  Discharge Exam: Vitals:   07/22/19 0758 07/22/19 0922  BP: (!) 141/73   Pulse: (!) 109 (!) 104  Resp: 17 16  Temp: 98.4 F (36.9 C)   SpO2: 96%     General: AAOx3 Cardiovascular: S1S2/RRR Respiratory: decreased BS at bases   Discharge Instructions   Discharge Instructions    Diet - low sodium heart healthy   Complete by: As directed    Increase activity slowly   Complete by: As directed      Allergies as of 07/22/2019      Reactions   Ampicillin Itching   Ceclor [cefaclor] Other (See Comments)   Passed out    Other Other (See Comments)   travoprost : unknown   Sulfa Antibiotics    Tape Itching  Tetracyclines & Related Itching   Tylenol With Codeine #3 [acetaminophen-codeine] Other (See Comments)   Passed out       Medication List    STOP taking these medications   meloxicam 15 MG tablet Commonly known as: MOBIC      TAKE these medications   albuterol 108 (90 Base) MCG/ACT inhaler Commonly known as: VENTOLIN HFA Inhale 2 puffs into the lungs every 6 (six) hours as needed for wheezing or shortness of breath.   cetirizine 10 MG tablet Commonly known as: ZYRTEC Take 10 mg by mouth at bedtime.   cyclobenzaprine 5 MG tablet Commonly known as: FLEXERIL Take 5 mg by mouth 3 (three) times daily as needed for muscle spasms.   fluticasone 50 MCG/ACT nasal spray Commonly known as: FLONASE Place 1 spray into both nostrils daily.   Fluticasone-Salmeterol 250-50 MCG/DOSE Aepb Commonly known as: ADVAIR Inhale 1 puff into the lungs 2 (two) times daily.   HYDROcodone-acetaminophen 5-325 MG tablet Commonly known as: NORCO/VICODIN Take 1 tablet by mouth every 6 (six) hours as needed for up to 5 days for moderate pain.   latanoprost 0.005 % ophthalmic solution Commonly known as: XALATAN Place 1 drop into both eyes at bedtime.   linezolid 600 MG tablet Commonly known as: ZYVOX Take 1 tablet (600 mg total) by mouth every 12 (twelve) hours.   multivitamin with minerals tablet Take 1 tablet by mouth daily.   VITAMIN C GUMMIE PO Take 2 tablets by mouth daily.   Vitamin D 1000 units capsule Take 1,000 Units by mouth daily.   vitamin E 200 UNIT capsule Take 200 Units by mouth daily.      Allergies  Allergen Reactions  . Ampicillin Itching  . Ceclor [Cefaclor] Other (See Comments)    Passed out   . Other Other (See Comments)    travoprost : unknown  . Sulfa Antibiotics   . Tape Itching  . Tetracyclines & Related Itching  . Tylenol With Codeine #3 [Acetaminophen-Codeine] Other (See Comments)    Passed out    Follow-up Information    Linden Dolin, MD Follow up on 08/02/2019.   Specialty: Cardiothoracic Surgery Why: Appointment is at 2:30, please get CXR at 2:00 at [facility] located on first floor of our office building Contact information: [address] [place] [address] [place]  [place] [address] [phone]            The results of significant diagnostics from this hospitalization (including imaging, microbiology, ancillary and laboratory) are listed below for reference.    Significant Diagnostic Studies: DG Chest 1 View  Result Date: 07/14/2019 CLINICAL DATA:  Status post thoracentesis on the left EXAM: CHEST  1 VIEW COMPARISON:  07/13/2019 FINDINGS: Cardiac shadow is stable. Slight reduction in pleural fluid is noted when compare with the prior exam. No pneumothorax is seen. Slight increased density is noted throughout the left lung which may be related to some re-expansion edema. No bony abnormality is noted. IMPRESSION: No pneumothorax following thoracentesis. There are however changes suggestive of re-expansion edema. Electronically Signed   By: Alcide Clever M.D.   On: 07/14/2019 08:54   DG Chest 2 View  Result Date: 07/21/2019 CLINICAL DATA:  Chest tightness. EXAM: CHEST - 2 VIEW COMPARISON:  07/19/2019 FINDINGS: There is no pneumothorax. There is persistent opacification of the left lung field, similar to prior study. There is a growing left-sided loculated pleural effusion. The right lung field is essentially clear with a trace right-sided pleural effusion. The heart  size is stable from prior study. There is no acute osseous abnormality. IMPRESSION: No pneumothorax. Persistent opacification of much of the left lung field with a likely growing loculated pleural effusion. Electronically Signed   By: Katherine Mantle M.D.   On: 07/21/2019 16:16   DG Chest 2 View  Result Date: 07/19/2019 CLINICAL DATA:  Pleural effusion on left EXAM: CHEST - 2 VIEW COMPARISON:  07/18/2019 FINDINGS: Interval removal of left chest tubes. No pneumothorax. Otherwise stable appearance of the left hemithorax. No confluent opacity on the right. Heart is normal size. IMPRESSION: Interval removal of left chest tubes without pneumothorax. Otherwise no change. Electronically Signed   By: Charlett Nose M.D.   On: 07/19/2019 07:37   DG Chest 2 View  Result Date: 07/13/2019 CLINICAL DATA:  Chest pain, shortness of breath. EXAM: CHEST - 2 VIEW COMPARISON:  October 21, 2014. FINDINGS: The heart size and mediastinal contours are within normal limits. No pneumothorax is noted. Right lung is clear. Mild left pleural effusion is noted with associated left basilar infiltrate or atelectasis. The visualized skeletal structures are unremarkable. IMPRESSION: Mild left pleural effusion is noted with associated left basilar infiltrate or atelectasis. Electronically Signed   By: Lupita Raider M.D.   On: 07/13/2019 16:47   CT CHEST WO CONTRAST  Result Date: 07/14/2019 CLINICAL DATA:  And pain in a status post thoracentesis EXAM: CT CHEST WITHOUT CONTRAST TECHNIQUE: Multidetector CT imaging of the chest was performed following the standard protocol without IV contrast. COMPARISON:  07/13/2019 FINDINGS: Cardiovascular: Heart is unremarkable without pericardial effusion. Grossly normal caliber of the thoracic aorta. Evaluation limited without intravenous contrast. Mediastinum/Nodes: Evaluation is limited without IV contrast. The thyroid, trachea, and esophagus are grossly unremarkable. No discrete adenopathy. Lungs/Pleura: Since the prior CT, there is been significant increase in the size of the multilocular complex left pleural effusion. There is progressive consolidation throughout the left lung, with relative sparing of the left apex. No left-sided pneumothorax after thoracentesis. There is a small free-flowing right pleural effusion. Patchy consolidation right lower lobe likely atelectasis. The central airways are patent. Upper Abdomen: No acute abnormality. Musculoskeletal: No acute or destructive bony lesions. Reconstructed images demonstrate no additional findings. IMPRESSION: 1. Enlarging multilocular complex left pleural effusion, compatible with empyema. 2. Progressive left lung consolidation with relative  sparing of the left apex. This could reflect underlying pneumonia. 3. Trace free-flowing right pleural effusion, new since prior study. Electronically Signed   By: Sharlet Salina M.D.   On: 07/14/2019 20:08   CT Angio Chest PE W and/or Wo Contrast  Result Date: 07/13/2019 CLINICAL DATA:  Shortness of breath with left-sided chest pain. EXAM: CT ANGIOGRAPHY CHEST WITH CONTRAST TECHNIQUE: Multidetector CT imaging of the chest was performed using the standard protocol during bolus administration of intravenous contrast. Multiplanar CT image reconstructions and MIPs were obtained to evaluate the vascular anatomy. CONTRAST:  75mL OMNIPAQUE IOHEXOL 350 MG/ML SOLN COMPARISON:  None. FINDINGS: Cardiovascular: Heart size normal. Small pericardial effusion evident. No thoracic aortic aneurysm. No filling defects within the opacified pulmonary arteries to suggest the presence of an acute pulmonary embolus Mediastinum/Nodes: 11 mm short axis AP window lymph node visible on image 44/5 nodular soft tissue is identified in the anterior mediastinum, potentially related to small clustered lymph nodes. No right hilar lymphadenopathy. There is abnormal soft tissue attenuation in the left hilum. There is no axillary lymphadenopathy. Lungs/Pleura: Ground-glass opacity is seen fairly diffusely in the left upper lobe with diffuse consolidation of the lingula  with airspace consolidation tracking centrally into the left hilum. There is left lower lobe collapse inferiorly. These changes are associated with a moderate left pleural effusion demonstrating loculation in the apex and anterior upper hemithorax. Nodular pleural thickening or loculated fluid is seen along the medial pleura adjacent to the transverse aorta. No right pleural effusion. Upper Abdomen: Unremarkable. Musculoskeletal: No worrisome lytic or sclerotic osseous abnormality. Review of the MIP images confirms the above findings. IMPRESSION: 1. No CT evidence for acute  pulmonary embolus. 2. Diffuse ground-glass opacity in the left upper lobe with diffuse consolidation of the lingula tracking centrally into the left hilum. This is associated with a moderate left pleural effusion demonstrating loculation and prominent areas of left lower lobe atelectasis. Nodular pleural thickening or loculated fluid is identified along the medial pleura adjacent to the transverse aorta. Mildly enlarged AP window lymph node with question of soft tissue nodularity/clustered lymph nodes in the anterior mediastinum. These changes may all be related to pneumonia, given the apparent pleural nodularity towards the apex, neoplasm is a distinct concern. Close follow-up recommended. Electronically Signed   By: Kennith Center M.D.   On: 07/13/2019 20:44   DG CHEST PORT 1 VIEW  Result Date: 07/18/2019 CLINICAL DATA:  Evaluate empyema EXAM: PORTABLE CHEST 1 VIEW COMPARISON:  07/17/2019 FINDINGS: Cardiac shadow is stable. Two chest tubes are again seen on the left. The overall appearance of the left hemithorax is stable. No pneumothorax is noted. Diffuse opacification throughout the lung is seen with multiple air bronchograms as well as persistent empyema. Right lung remains clear. Bony structures are unremarkable. IMPRESSION: Stable appearance of the chest when compared with the previous day. Electronically Signed   By: Alcide Clever M.D.   On: 07/18/2019 08:46   DG CHEST PORT 1 VIEW  Result Date: 07/17/2019 CLINICAL DATA:  Follow-up empyema EXAM: PORTABLE CHEST 1 VIEW COMPARISON:  07/16/2019 FINDINGS: Cardiac shadow is stable. Diffuse infiltrate is noted throughout the left lung. Two left thoracostomy catheters are noted in place. Some persistent pleural fluid is noted consistent with the given clinical history. No pneumothorax is seen. Right lung remains clear. IMPRESSION: Stable appearance of the chest when compare with the prior exam. Electronically Signed   By: Alcide Clever M.D.   On: 07/17/2019  09:59   DG CHEST PORT 1 VIEW  Result Date: 07/16/2019 CLINICAL DATA:  Status post VATS and decortication on the left on 07/15/2019. EXAM: PORTABLE CHEST 1 VIEW COMPARISON:  Yesterday. FINDINGS: Two left chest tubes remain in place. No pneumothorax. Diffuse airspace opacity in the left lung with mild improvement. Possible minimal lateral and apical pleural fluid or thickening on the left. Interval minimal patchy opacity in the right lower lung zone. Stable mild peribronchial thickening on the right. Grossly normal sized heart. Decreased subcutaneous air on the left. Unremarkable bones. Gas distended bowel in the left upper abdomen. IMPRESSION: 1. No pneumothorax. 2. Diffuse left lung pneumonia and/or pulmonary edema, mildly improved. 3. Interval minimal patchy atelectasis or pneumonia in the right lower lung zone. 4. Decreased subcutaneous air on the left. Electronically Signed   By: Beckie Salts M.D.   On: 07/16/2019 08:51   DG Chest Port 1 View  Result Date: 07/15/2019 CLINICAL DATA:  Post lung surgery follow-up. EXAM: PORTABLE CHEST 1 VIEW COMPARISON:  07/15/2019 FINDINGS: Examination demonstrates adequate lung volumes with 2 left-sided chest tubes present. Mild opacification throughout the left lung with improved aeration compared to the prior exam. Right lung is clear. Cardiomediastinal silhouette is  within normal. Mild subcutaneous emphysema over the lower left flank. Remainder the exam is unchanged. IMPRESSION: Hazy opacification over the left lung with interval improved aeration post placement 2 left-sided chest tubes. Electronically Signed   By: Elberta Fortis M.D.   On: 07/15/2019 14:50   DG Chest Port 1 View  Result Date: 07/15/2019 CLINICAL DATA:  Empyema. EXAM: PORTABLE CHEST 1 VIEW COMPARISON:  CT 07/14/2019.  Chest x-ray 07/14/2019. FINDINGS: Complete opacification left hemithorax is noted on today's exam. This is most consistent with reaccumulation of left pleural fluid/progressive empyema.  Underlying pulmonary infiltrate cannot be excluded. No pneumothorax. Heart size cannot be evaluated no acute bony abnormality. IMPRESSION: Complete opacification left hemithorax is noted on today's exam. This is most consistent with reaccumulation of left pleural fluid/progressive empyema. Electronically Signed   By: Maisie Fus  Register   On: 07/15/2019 08:40   ECHOCARDIOGRAM COMPLETE  Result Date: 07/15/2019    ECHOCARDIOGRAM REPORT   Patient Name:   DESEREE ZEMAITIS Date of Exam: 07/15/2019 Medical Rec #:  [id number]              Height:       57.0 in Accession #:    [id number]             Weight:       95.8 lb Date of Birth:  February 19, 1961               BSA:          1.315 m Patient Age:    59 years               BP:           143/90 mmHg Patient Gender: F                      HR:           120 bpm. Exam Location:  Inpatient Procedure: 2D Echo Indications:    Elevated Troponin  History:        Patient has no prior history of Echocardiogram examinations.                 Signs/Symptoms:Shortness of Breath. GERD. Shortness of breath                 with associated chest and shoulder pain, fever. Sepsis secondary                 to pneumonia, with loculated empyema.  Sonographer:    Leta Jungling RDCS Referring Phys: [id number] ANASTASSIA DOUTOVA IMPRESSIONS  1. Left ventricular ejection fraction, by estimation, is 55%. The left ventricle has normal function. The left ventricle has no regional wall motion abnormalities. Left ventricular diastolic parameters are indeterminate.  2. Right ventricular systolic function is normal. The right ventricular size is normal. Tricuspid regurgitation signal is inadequate for assessing PA pressure.  3. The mitral valve is normal in structure. Trivial mitral valve regurgitation. No evidence of mitral stenosis.  4. The aortic valve is normal in structure. Aortic valve regurgitation is not visualized. No aortic stenosis is present.  5. Dilated pulmonary artery.  6. The inferior vena cava  is normal in size with greater than 50% respiratory variability, suggesting right atrial pressure of 3 mmHg. FINDINGS  Left Ventricle: Left ventricular ejection fraction, by estimation, is 55%. The left ventricle has normal function. The left ventricle has no regional wall motion abnormalities. The left ventricular internal cavity size was normal in size.  There is no left ventricular hypertrophy. Left ventricular diastolic parameters are indeterminate. Right Ventricle: The right ventricular size is normal. No increase in right ventricular wall thickness. Right ventricular systolic function is normal. Tricuspid regurgitation signal is inadequate for assessing PA pressure. Left Atrium: Left atrial size was normal in size. Right Atrium: Right atrial size was normal in size. Pericardium: There is no evidence of pericardial effusion. Mitral Valve: The mitral valve is normal in structure. Normal mobility of the mitral valve leaflets. Trivial mitral valve regurgitation. No evidence of mitral valve stenosis. Tricuspid Valve: The tricuspid valve is normal in structure. Tricuspid valve regurgitation is not demonstrated. No evidence of tricuspid stenosis. Aortic Valve: The aortic valve is normal in structure. Aortic valve regurgitation is not visualized. No aortic stenosis is present. Pulmonic Valve: The pulmonic valve was normal in structure. Pulmonic valve regurgitation is not visualized. No evidence of pulmonic stenosis. Aorta: The aortic root is normal in size and structure. Pulmonary Artery: The pulmonary artery is dilated. Venous: The inferior vena cava is normal in size with greater than 50% respiratory variability, suggesting right atrial pressure of 3 mmHg. IAS/Shunts: There is redundancy of the interatrial septum. No atrial level shunt detected by color flow Doppler.  LEFT VENTRICLE PLAX 2D LVIDd:         3.70 cm LVIDs:         2.70 cm LV PW:         0.70 cm LV IVS:        0.90 cm LVOT diam:     1.90 cm LV SV:          34 LV SV Index:   26 LVOT Area:     2.84 cm  LV Volumes (MOD) LV vol d, MOD A2C: 73.8 ml LV vol d, MOD A4C: 71.2 ml LV vol s, MOD A2C: 34.9 ml LV vol s, MOD A4C: 36.1 ml LV SV MOD A2C:     38.9 ml LV SV MOD A4C:     71.2 ml LV SV MOD BP:      37.4 ml RIGHT VENTRICLE RV S prime:     17.70 cm/s TAPSE (M-mode): 1.9 cm LEFT ATRIUM             Index       RIGHT ATRIUM          Index LA diam:        3.00 cm 2.28 cm/m  RA Area:     7.41 cm LA Vol (A2C):   26.7 ml 20.30 ml/m RA Volume:   12.50 ml 9.50 ml/m LA Vol (A4C):   23.3 ml 17.71 ml/m LA Biplane Vol: 27.2 ml 20.68 ml/m  AORTIC VALVE LVOT Vmax:   89.50 cm/s LVOT Vmean:  61.600 cm/s LVOT VTI:    0.120 m  AORTA Ao Root diam: 2.90 cm Ao Asc diam:  2.70 cm  SHUNTS Systemic VTI:  0.12 m Systemic Diam: 1.90 cm Weston Brass MD Electronically signed by Weston Brass MD Signature Date/Time: 07/15/2019/12:54:26 PM    Final    VAS [place] LOWER EXTREMITY VENOUS (DVT)  Result Date: 07/15/2019  Lower Venous DVTStudy Indications: Elevated ddimer.  Comparison Study: no prior Performing Technologist: Blanch Media RVS  Examination Guidelines: A complete evaluation includes B-mode imaging, spectral Doppler, color Doppler, and power Doppler as needed of all accessible portions of each vessel. Bilateral testing is considered an integral part of a complete examination. Limited examinations for reoccurring indications may be performed as noted. The reflux  portion of the exam is performed with the patient in reverse Trendelenburg.  +---------+---------------+---------+-----------+----------+--------------+ RIGHT    CompressibilityPhasicitySpontaneityPropertiesThrombus Aging +---------+---------------+---------+-----------+----------+--------------+ CFV      Full           Yes      Yes                                 +---------+---------------+---------+-----------+----------+--------------+ SFJ      Full                                                         +---------+---------------+---------+-----------+----------+--------------+ FV Prox  Full                                                        +---------+---------------+---------+-----------+----------+--------------+ FV Mid   Full                                                        +---------+---------------+---------+-----------+----------+--------------+ FV DistalFull                                                        +---------+---------------+---------+-----------+----------+--------------+ PFV      Full                                                        +---------+---------------+---------+-----------+----------+--------------+ POP      Full           Yes      Yes                                 +---------+---------------+---------+-----------+----------+--------------+ PTV      Full                                                        +---------+---------------+---------+-----------+----------+--------------+ PERO     Full                                                        +---------+---------------+---------+-----------+----------+--------------+   +---------+---------------+---------+-----------+----------+--------------+ LEFT     CompressibilityPhasicitySpontaneityPropertiesThrombus Aging +---------+---------------+---------+-----------+----------+--------------+ CFV      Full           Yes  Yes                                 +---------+---------------+---------+-----------+----------+--------------+ SFJ      Full                                                        +---------+---------------+---------+-----------+----------+--------------+ FV Prox  Full                                                        +---------+---------------+---------+-----------+----------+--------------+ FV Mid   Full                                                         +---------+---------------+---------+-----------+----------+--------------+ FV DistalFull                                                        +---------+---------------+---------+-----------+----------+--------------+ PFV      Full                                                        +---------+---------------+---------+-----------+----------+--------------+ POP      Full           Yes      Yes                                 +---------+---------------+---------+-----------+----------+--------------+ PTV      Full                                                        +---------+---------------+---------+-----------+----------+--------------+ PERO     Full                                                        +---------+---------------+---------+-----------+----------+--------------+     Summary: BILATERAL: - No evidence of deep vein thrombosis seen in the lower extremities, bilaterally. - RIGHT: - A cystic structure is found in the popliteal fossa.  LEFT: - No cystic structure found in the popliteal fossa.  *See table(s) above for measurements and observations. Electronically signed by Sherald Hess MD on 07/15/2019 at 5:26:31 PM.    Final    IR THORACENTESIS ASP PLEURAL SPACE W/IMG GUIDE  Result Date:  07/14/2019 INDICATION: Patient with history of recurrent pneumonia, presents with shortness of breath and left pleural effusion. Request is made for diagnostic and therapeutic left thoracentesis. EXAM: ULTRASOUND GUIDED DIAGNOSTIC AND THERAPEUTIC LEFT THORACENTESIS MEDICATIONS: 10 mL 1% lidocaine COMPLICATIONS: SIR Level A - No therapy, no consequence. PROCEDURE: An ultrasound guided thoracentesis was thoroughly discussed with the patient and questions answered. The benefits, risks, alternatives and complications were also discussed. The patient understands and wishes to proceed with the procedure. Written consent was obtained. Ultrasound was performed to localize and  mark an adequate pocket of fluid in the left chest. The area was then prepped and draped in the normal sterile fashion. 1% Lidocaine was used for local anesthesia. Under ultrasound guidance a 6 Fr Safe-T-Centesis catheter was introduced. Thoracentesis was performed. The catheter was removed and a dressing applied. FINDINGS: A total of approximately 250 mL of cloudy, purulent-appearing fluid was removed. Samples were sent to the laboratory as requested by the clinical team. Postprocedure chest x-ray shows show suggestion of re-expansion edema. IMPRESSION: Successful ultrasound guided diagnostic and therapeutic thoracentesis yielding 250 mL of pleural fluid. Read by: Loyce Dys PA-C Electronically Signed   By: Gilmer Mor D.O.   On: 07/14/2019 11:36    Microbiology: Recent Results (from the past 240 hour(s))  Culture, blood (Routine X 2) w Reflex to ID Panel     Status: None   Collection Time: 07/13/19  5:30 PM   Specimen: BLOOD RIGHT FOREARM  Result Value Ref Range Status   Specimen Description BLOOD RIGHT FOREARM  Final   Special Requests   Final    BOTTLES DRAWN AEROBIC AND ANAEROBIC Blood Culture adequate volume   Culture   Final    NO GROWTH 5 DAYS Performed at [facility], [address] [place], [place] [address]    Report Status 07/18/2019 FINAL  Final  Culture, blood (Routine X 2) w Reflex to ID Panel     Status: None   Collection Time: 07/13/19  5:30 PM   Specimen: BLOOD  Result Value Ref Range Status   Specimen Description BLOOD LEFT ANTECUBITAL  Final   Special Requests   Final    BOTTLES DRAWN AEROBIC AND ANAEROBIC Blood Culture results may not be optimal due to an inadequate volume of blood received in culture bottles   Culture   Final    NO GROWTH 5 DAYS Performed at [facility], [address], [place], [place] [address]    Report Status 07/18/2019 FINAL  Final  SARS Coronavirus 2 by RT PCR (hospital order, performed in [facility] hospital lab)  Nasopharyngeal Nasopharyngeal Swab     Status: None   Collection Time: 07/13/19  5:41 PM   Specimen: Nasopharyngeal Swab  Result Value Ref Range Status   SARS Coronavirus 2 NEGATIVE NEGATIVE Final    Comment: (NOTE) SARS-CoV-2 target nucleic acids are NOT DETECTED. The SARS-CoV-2 RNA is generally detectable in upper and lower respiratory specimens during the acute phase of infection. The lowest concentration of SARS-CoV-2 viral copies this assay can detect is 250 copies / mL. A negative result does not preclude SARS-CoV-2 infection and should not be used as the sole basis for treatment or other patient management decisions.  A negative result may occur with improper specimen collection / handling, submission of specimen other than nasopharyngeal swab, presence of viral mutation(s) within the areas targeted by this assay, and inadequate number of viral copies (<250 copies / mL). A negative result must be combined  with clinical observations, patient history, and epidemiological information. Fact Sheet for Patients:   BoilerBrush.com.cy Fact Sheet for Healthcare Providers: https://pope.com/ This test is not yet approved or cleared  by the [place] FDA and has been authorized for detection and/or diagnosis of SARS-CoV-2 by FDA under an Emergency Use Authorization (EUA).  This EUA will remain in effect (meaning this test can be used) for the duration of the COVID-19 declaration under Section 564(b)(1) of the Act, 21 U.S.C. section 360bbb-3(b)(1), unless the authorization is terminated or revoked sooner. Performed at [facility], [address], [place], [place] [address]   MRSA PCR Screening     Status: None   Collection Time: 07/14/19  7:30 AM   Specimen: Nasal Mucosa; Nasopharyngeal  Result Value Ref Range Status   MRSA by PCR NEGATIVE NEGATIVE Final    Comment:        The GeneXpert MRSA Assay (FDA approved for NASAL  specimens only), is one component of a comprehensive MRSA colonization surveillance program. It is not intended to diagnose MRSA infection nor to guide or monitor treatment for MRSA infections. Performed at [facility], [address], [place], [place] [address]   Respiratory Panel by PCR     Status: None   Collection Time: 07/14/19  7:30 AM   Specimen: Nasal Mucosa; Respiratory  Result Value Ref Range Status   Adenovirus NOT DETECTED NOT DETECTED Final   Coronavirus 229E NOT DETECTED NOT DETECTED Final    Comment: (NOTE) The Coronavirus on the Respiratory Panel, DOES NOT test for the novel  Coronavirus (2019 nCoV)    Coronavirus HKU1 NOT DETECTED NOT DETECTED Final   Coronavirus NL63 NOT DETECTED NOT DETECTED Final   Coronavirus OC43 NOT DETECTED NOT DETECTED Final   Metapneumovirus NOT DETECTED NOT DETECTED Final   Rhinovirus / Enterovirus NOT DETECTED NOT DETECTED Final   Influenza A NOT DETECTED NOT DETECTED Final   Influenza B NOT DETECTED NOT DETECTED Final   Parainfluenza Virus 1 NOT DETECTED NOT DETECTED Final   Parainfluenza Virus 2 NOT DETECTED NOT DETECTED Final   Parainfluenza Virus 3 NOT DETECTED NOT DETECTED Final   Parainfluenza Virus 4 NOT DETECTED NOT DETECTED Final   Respiratory Syncytial Virus NOT DETECTED NOT DETECTED Final   Bordetella pertussis NOT DETECTED NOT DETECTED Final   Chlamydophila pneumoniae NOT DETECTED NOT DETECTED Final   Mycoplasma pneumoniae NOT DETECTED NOT DETECTED Final    Comment: Performed at [facility], [address] [place], [place] [address]  Gram stain     Status: None   Collection Time: 07/14/19  8:46 AM   Specimen: Lung, Left; Pleural Fluid  Result Value Ref Range Status   Specimen Description PLEURAL FLUID  Final   Special Requests LEFT LUNG  Final   Gram Stain   Final    WBC PRESENT,BOTH PMN AND MONONUCLEAR GRAM POSITIVE COCCI CYTOSPIN SMEAR Gram Stain Report Called to,Read Back By and Verified With: Elfredia Nevins RN 1240 07/14/19 A BROWNING Performed at [facility], [address] [place], [place] [address]    Report Status 07/14/2019 FINAL  Final  Culture, body fluid-bottle     Status: Abnormal   Collection Time: 07/14/19  8:46 AM   Specimen: Pleura  Result Value Ref Range Status   Specimen Description PLEURAL FLUID  Final   Special Requests LEFT LUNG  Final   Gram Stain   Final    IN BOTH AEROBIC AND ANAEROBIC BOTTLES GRAM  POSITIVE COCCI IN CLUSTERS CRITICAL RESULT CALLED TO, READ BACK BY AND VERIFIED WITH: RN R SANTIAGO [id number] AT 908 AM BY CM    Culture (A)  Final    METHICILLIN RESISTANT STAPHYLOCOCCUS AUREUS CRITICAL RESULT CALLED TO, READ BACK BY AND VERIFIED WITH: RN R SANTIAGO [phone] [id number] AR 908 AM BY CM Performed at [facility], [address] [place], [place] [address]    Report Status 07/16/2019 FINAL  Final   Organism ID, Bacteria METHICILLIN RESISTANT STAPHYLOCOCCUS AUREUS  Final      Susceptibility   Methicillin resistant staphylococcus aureus - MIC*    CIPROFLOXACIN >=8 RESISTANT Resistant     ERYTHROMYCIN >=8 RESISTANT Resistant     GENTAMICIN <=0.5 SENSITIVE Sensitive     OXACILLIN >=4 RESISTANT Resistant     TETRACYCLINE <=1 SENSITIVE Sensitive     VANCOMYCIN 1 SENSITIVE Sensitive     TRIMETH/SULFA <=10 SENSITIVE Sensitive     CLINDAMYCIN <=0.25 SENSITIVE Sensitive     RIFAMPIN <=0.5 SENSITIVE Sensitive     Inducible Clindamycin NEGATIVE Sensitive     * METHICILLIN RESISTANT STAPHYLOCOCCUS AUREUS  Anaerobic culture     Status: None   Collection Time: 07/15/19  2:03 PM   Specimen: Pleural, Left; Body Fluid  Result Value Ref Range Status   Specimen Description FLUID LEFT PLEURAL  Final   Special Requests NONE  Final   Gram Stain   Final    ABUNDANT WBC PRESENT,BOTH PMN AND MONONUCLEAR NO ORGANISMS SEEN    Culture   Final    NO ANAEROBES ISOLATED Performed at [facility], [address] [place], [place] [address]    Report Status  07/20/2019 FINAL  Final  Anaerobic culture     Status: None   Collection Time: 07/15/19  2:03 PM   Specimen: Pleural, Left; Body Fluid  Result Value Ref Range Status   Specimen Description FLUID LEFT PLEURAL  Final   Special Requests PEEL  Final   Gram Stain   Final    ABUNDANT WBC PRESENT,BOTH PMN AND MONONUCLEAR RARE GRAM POSITIVE COCCI    Culture   Final    NO ANAEROBES ISOLATED Performed at [facility], [address] [place], [place] [address]    Report Status 07/20/2019 FINAL  Final  Body fluid culture     Status: None   Collection Time: 07/15/19  2:03 PM   Specimen: Pleural, Left; Body Fluid  Result Value Ref Range Status   Specimen Description FLUID LEFT PLEURAL  Final   Special Requests PEEL  Final   Gram Stain   Final    ABUNDANT WBC PRESENT,BOTH PMN AND MONONUCLEAR RARE GRAM POSITIVE COCCI Performed at [facility], [address] [place], [place] [address]    Culture FEW METHICILLIN RESISTANT STAPHYLOCOCCUS AUREUS  Final   Report Status 07/17/2019 FINAL  Final   Organism ID, Bacteria METHICILLIN RESISTANT STAPHYLOCOCCUS AUREUS  Final      Susceptibility   Methicillin resistant staphylococcus aureus - MIC*    CIPROFLOXACIN >=8 RESISTANT Resistant     ERYTHROMYCIN >=8 RESISTANT Resistant     GENTAMICIN <=0.5 SENSITIVE Sensitive     OXACILLIN >=4 RESISTANT Resistant     TETRACYCLINE <=1 SENSITIVE Sensitive     VANCOMYCIN 1 SENSITIVE Sensitive     TRIMETH/SULFA <=10 SENSITIVE Sensitive     CLINDAMYCIN <=0.25 SENSITIVE Sensitive     RIFAMPIN <=0.5 SENSITIVE Sensitive     Inducible Clindamycin NEGATIVE Sensitive     *  FEW METHICILLIN RESISTANT STAPHYLOCOCCUS AUREUS  Body fluid culture     Status: Abnormal   Collection Time: 07/15/19  2:03 PM   Specimen: Fluid  Result Value Ref Range Status   Specimen Description FLUID LEFT PLEURAL  Final   Special Requests NONE  Final   Gram Stain   Final    ABUNDANT WBC PRESENT,BOTH PMN AND MONONUCLEAR NO  ORGANISMS SEEN    Culture (A)  Final    METHICILLIN RESISTANT STAPHYLOCOCCUS AUREUS SUSCEPTIBILITIES PERFORMED ON PREVIOUS CULTURE WITHIN THE LAST 5 DAYS. Performed at [facility], [address] [place], [place] [address]    Report Status 07/18/2019 FINAL  Final   Organism ID, Bacteria METHICILLIN RESISTANT STAPHYLOCOCCUS AUREUS  Final      Susceptibility   Methicillin resistant staphylococcus aureus - MIC*    CIPROFLOXACIN >=8 RESISTANT Resistant     ERYTHROMYCIN >=8 RESISTANT Resistant     GENTAMICIN <=0.5 SENSITIVE Sensitive     OXACILLIN >=4 RESISTANT Resistant     TETRACYCLINE <=1 SENSITIVE Sensitive     VANCOMYCIN 1 SENSITIVE Sensitive     TRIMETH/SULFA <=10 SENSITIVE Sensitive     CLINDAMYCIN <=0.25 SENSITIVE Sensitive     RIFAMPIN <=0.5 SENSITIVE Sensitive     Inducible Clindamycin NEGATIVE Sensitive     * METHICILLIN RESISTANT STAPHYLOCOCCUS AUREUS     Labs: Basic Metabolic Panel: Recent Labs  Lab 07/18/19 0417 07/19/19 0322 07/20/19 0829 07/21/19 0312 07/22/19 0302  NA 134* 135 134* 131* 132*  K 3.6 3.4* 3.7 3.3* 4.3  CL 98 97* 97* 92* 96*  CO2 GLUCOSE 90 115* 138* 147* 117*  BUN 8 7* <5* <5* <5*  CREATININE 0.42* 0.43* 0.48 0.51 0.50  CALCIUM 8.1* 8.1* 8.4* 8.1* 8.3*   Liver Function Tests: Recent Labs  Lab 07/15/19 1730 07/17/19 0343  AST 29 34  ALT 34 33  ALKPHOS 135* 194*  BILITOT 0.9 1.4*  PROT 5.2* 5.6*  ALBUMIN 1.7* 1.8*   No results for input(s): LIPASE, AMYLASE in the last 168 hours. No results for input(s): AMMONIA in the last 168 hours. CBC: Recent Labs  Lab 07/18/19 0417 07/19/19 0322 07/20/19 0829 07/21/19 0312 07/22/19 0302  WBC 18.7* 18.6* 16.5* 14.2* 12.8*  HGB 12.9 11.4* 12.5 10.3* 10.2*  HCT 39.3 34.9* 37.9 31.4* 31.5*  MCV 89.3 89.7 89.0 89.2 90.0  PLT 417* 447* 668* 640* 739*   Cardiac Enzymes: No results for input(s): CKTOTAL, CKMB, CKMBINDEX, TROPONINI in the last 168 hours. BNP: BNP  (last 3 results) No results for input(s): BNP in the last 8760 hours.  ProBNP (last 3 results) No results for input(s): PROBNP in the last 8760 hours.  CBG: Recent Labs  Lab 07/17/19 0800  GLUCAP 88       Signed:  Zannie Cove MD.  [facility] 07/22/2019, 2:27 PM

## 2019-07-22 NOTE — Progress Notes (Signed)
Pt. Given D/c summary, pt. Verbalized understanding to d/c instructions.  Pt, ambulating in room at this time without distress.  Transported via w/c by NT to front entrance.

## 2019-07-30 ENCOUNTER — Other Ambulatory Visit: Payer: Self-pay | Admitting: Cardiothoracic Surgery

## 2019-07-30 DIAGNOSIS — J869 Pyothorax without fistula: Secondary | ICD-10-CM

## 2019-08-02 ENCOUNTER — Ambulatory Visit (INDEPENDENT_AMBULATORY_CARE_PROVIDER_SITE_OTHER): Payer: Self-pay | Admitting: Cardiothoracic Surgery

## 2019-08-02 ENCOUNTER — Other Ambulatory Visit: Payer: Self-pay

## 2019-08-02 ENCOUNTER — Ambulatory Visit
Admission: RE | Admit: 2019-08-02 | Discharge: 2019-08-02 | Disposition: A | Discharge status: Home / Self Care | Source: Ambulatory Visit | Attending: Cardiothoracic Surgery | Admitting: Cardiothoracic Surgery

## 2019-08-02 VITALS — BP 128/81 | HR 100 | Temp 97.4°F | Resp 20 | Ht <= 58 in | Wt 86.5 lb

## 2019-08-02 DIAGNOSIS — J869 Pyothorax without fistula: Secondary | ICD-10-CM

## 2019-08-02 NOTE — Progress Notes (Signed)
[address]       [place] [address]             [phone]     CARDIOTHORACIC SURGERY OFFICE NOTE  Referring Provider is Therisa Doyne, MD Primary Cardiologist is No primary care provider on file. PCP is Marva Panda, NP   HPI:  59 year old lady is status post left VATS pulmonary decortication for empyema.  She returns for postoperative visit.  She states that her lung capacity is not quite there yet but she denies any fevers or chills and she is slowly improving.   Current Outpatient Medications  Medication Sig Dispense Refill  . albuterol (PROVENTIL HFA;VENTOLIN HFA) 108 (90 BASE) MCG/ACT inhaler Inhale 2 puffs into the lungs every 6 (six) hours as needed for wheezing or shortness of breath.    . Ascorbic Acid (VITAMIN C GUMMIE PO) Take 2 tablets by mouth daily.    . cetirizine (ZYRTEC) 10 MG tablet Take 10 mg by mouth at bedtime.     . Cholecalciferol (VITAMIN D) 1000 UNITS capsule Take 1,000 Units by mouth daily.    . cyclobenzaprine (FLEXERIL) 5 MG tablet Take 5 mg by mouth 3 (three) times daily as needed for muscle spasms.    . fluticasone (FLONASE) 50 MCG/ACT nasal spray Place 1 spray into both nostrils daily.    . Fluticasone-Salmeterol (ADVAIR) 250-50 MCG/DOSE AEPB Inhale 1 puff into the lungs 2 (two) times daily.    Marland Kitchen latanoprost (XALATAN) 0.005 % ophthalmic solution Place 1 drop into both eyes at bedtime.     Marland Kitchen linezolid (ZYVOX) 600 MG tablet Take 1 tablet (600 mg total) by mouth every 12 (twelve) hours. 14 tablet 0  . Multiple Vitamins-Minerals (MULTIVITAMIN WITH MINERALS) tablet Take 1 tablet by mouth daily.    . vitamin E 200 UNIT capsule Take 200 Units by mouth daily.     No current facility-administered medications for this visit.      Physical Exam:   BP 128/81 (BP Location: Right Arm)   Pulse 100   Temp (!) 97.4 F (36.3 C) (Temporal)   Resp 20   Ht 4\' 9"  (1.448 m)   Wt 39.2 kg   SpO2 99% Comment: RA  BMI 18.72  kg/m   General:  Well-appearing no acute distress  Chest:   Clear to auscultation  CV:   Regular rate and rhythm  Incisions:  Well-healed  Abdomen:  Soft nontender nondistended  Extremities:  Mild edema  Diagnostic Tests:  Chest x-ray is reviewed demonstrating clear lung fields   Impression:  Doing well after left VATS for decortication  Plan:  Follow-up with thoracic surgery as needed Refer to Dr. for ongoing pulmonology care as per the patient's request  I spent in excess of 20 minutes during the conduct of this office consultation and >50% of this time involved direct face-to-face encounter with the patient for counseling and/or coordination of their care.  Level 2                 10 minutes Level 3                 15 minutes Level 4                 25 minutes Level 5                 40 minutes  B.  Mechele Collin, MD 08/02/2019 3:38 PM

## 2019-08-11 ENCOUNTER — Telehealth: Payer: Self-pay | Admitting: Gastroenterology

## 2019-08-11 NOTE — Telephone Encounter (Signed)
I will look for those records.  

## 2019-08-11 NOTE — Telephone Encounter (Signed)
Hey Dr. Orvan Falconer, patient is being referred to [place] for a colonoscopy. Her last colonoscopy was back in 2013. We have received the records and she has requested you as her doctor. I am sending the records up to you for review. Please advise. Thank you!

## 2019-08-12 NOTE — Telephone Encounter (Signed)
Needs office visit prior to scheduling endoscopy.  Colonoscopy at [facility] with Dr. Leandrew Koyanagi for a personal history of colon polyps, colon cancer in a distant relative 10/15/11. Internal hemorrhoids identified. No polyps. Surveillance recommended in 5 years.

## 2019-08-16 NOTE — Telephone Encounter (Signed)
Patient is scheduled 8/17.

## 2019-08-23 ENCOUNTER — Ambulatory Visit (INDEPENDENT_AMBULATORY_CARE_PROVIDER_SITE_OTHER): Payer: Self-pay

## 2019-08-23 ENCOUNTER — Other Ambulatory Visit: Payer: Self-pay

## 2019-08-23 VITALS — Temp 98.1°F

## 2019-08-23 DIAGNOSIS — Z4889 Encounter for other specified surgical aftercare: Secondary | ICD-10-CM

## 2019-08-23 DIAGNOSIS — J869 Pyothorax without fistula: Secondary | ICD-10-CM

## 2019-08-23 NOTE — Progress Notes (Signed)
Pt comes in for wound check of L chest incision after reporting a small amount of blood from site this morning. She is s/p L VATS, drainage of empyema/decortication by Dr. Vickey Sages on 07/15/19. Incisions are c/d/i except for one directly under L breast, which pt reports began bleeding after she put on a sundress today that had elastic near the site. She reports a nickel-size amount of bright red blood this morning, and gauze dressing that is currently removed has a trace amount of serosanguinous drainage. Incision site has a clear mucous-like film holding it together--no redness, swelling, or purulent drainage noted. Dorathy Daft, PA takes a look at the area and advises pt to keep it clean, dry, and covered with a gauze dressing until it heals d/t being under her breast. New 2 x 2 applied, and several sterile 2 x 2's given to pt. Instructed pt to report fever and/or signs of infection (redness, swelling, purulent drainage) at the site. She verbalizes understanding.

## 2019-08-24 ENCOUNTER — Ambulatory Visit

## 2019-09-16 ENCOUNTER — Institutional Professional Consult (permissible substitution): Admitting: Internal Medicine

## 2019-10-12 ENCOUNTER — Ambulatory Visit: Admitting: Gastroenterology

## 2019-10-14 ENCOUNTER — Institutional Professional Consult (permissible substitution): Admitting: Pulmonary Disease

## 2020-02-17 ENCOUNTER — Ambulatory Visit

## 2020-07-18 ENCOUNTER — Other Ambulatory Visit: Payer: Self-pay | Admitting: *Deleted

## 2020-07-18 DIAGNOSIS — Z1231 Encounter for screening mammogram for malignant neoplasm of breast: Secondary | ICD-10-CM

## 2020-10-13 ENCOUNTER — Other Ambulatory Visit: Payer: Self-pay

## 2020-10-13 ENCOUNTER — Ambulatory Visit
Admission: RE | Admit: 2020-10-13 | Discharge: 2020-10-13 | Disposition: A | Discharge status: Home / Self Care | Payer: Self-pay | Source: Ambulatory Visit | Attending: *Deleted | Admitting: *Deleted

## 2020-10-13 DIAGNOSIS — Z1231 Encounter for screening mammogram for malignant neoplasm of breast: Secondary | ICD-10-CM

## 2020-10-16 ENCOUNTER — Other Ambulatory Visit: Payer: Self-pay | Admitting: *Deleted

## 2020-10-16 DIAGNOSIS — R928 Other abnormal and inconclusive findings on diagnostic imaging of breast: Secondary | ICD-10-CM

## 2020-11-08 ENCOUNTER — Other Ambulatory Visit: Payer: Self-pay

## 2020-11-08 ENCOUNTER — Ambulatory Visit: Payer: 59

## 2020-11-08 ENCOUNTER — Ambulatory Visit
Admission: RE | Admit: 2020-11-08 | Discharge: 2020-11-08 | Disposition: A | Discharge status: Home / Self Care | Payer: 59 | Source: Ambulatory Visit | Attending: *Deleted | Admitting: *Deleted

## 2020-11-08 DIAGNOSIS — R928 Other abnormal and inconclusive findings on diagnostic imaging of breast: Secondary | ICD-10-CM

## 2022-02-19 DIAGNOSIS — J45909 Unspecified asthma, uncomplicated: Secondary | ICD-10-CM | POA: Diagnosis not present

## 2022-02-19 DIAGNOSIS — Z1231 Encounter for screening mammogram for malignant neoplasm of breast: Secondary | ICD-10-CM | POA: Diagnosis not present

## 2022-02-19 DIAGNOSIS — Z0000 Encounter for general adult medical examination without abnormal findings: Secondary | ICD-10-CM | POA: Diagnosis not present

## 2022-02-21 DIAGNOSIS — Z0000 Encounter for general adult medical examination without abnormal findings: Secondary | ICD-10-CM | POA: Diagnosis not present

## 2022-02-26 ENCOUNTER — Other Ambulatory Visit: Payer: Self-pay | Admitting: Family Medicine

## 2022-02-26 DIAGNOSIS — Z1231 Encounter for screening mammogram for malignant neoplasm of breast: Secondary | ICD-10-CM

## 2022-03-29 DIAGNOSIS — J069 Acute upper respiratory infection, unspecified: Secondary | ICD-10-CM | POA: Diagnosis not present

## 2022-04-01 DIAGNOSIS — J069 Acute upper respiratory infection, unspecified: Secondary | ICD-10-CM | POA: Diagnosis not present

## 2022-04-01 DIAGNOSIS — Z20822 Contact with and (suspected) exposure to covid-19: Secondary | ICD-10-CM | POA: Diagnosis not present

## 2022-04-15 DIAGNOSIS — J018 Other acute sinusitis: Secondary | ICD-10-CM | POA: Diagnosis not present

## 2022-04-15 DIAGNOSIS — R051 Acute cough: Secondary | ICD-10-CM | POA: Diagnosis not present

## 2022-04-15 DIAGNOSIS — Z6821 Body mass index (BMI) 21.0-21.9, adult: Secondary | ICD-10-CM | POA: Diagnosis not present

## 2022-04-24 ENCOUNTER — Ambulatory Visit
Admission: RE | Admit: 2022-04-24 | Discharge: 2022-04-24 | Disposition: A | Discharge status: Home / Self Care | Payer: 59 | Source: Ambulatory Visit | Attending: Family Medicine | Admitting: Family Medicine

## 2022-04-24 DIAGNOSIS — Z1231 Encounter for screening mammogram for malignant neoplasm of breast: Secondary | ICD-10-CM

## 2022-04-26 ENCOUNTER — Other Ambulatory Visit: Payer: Self-pay | Admitting: Family Medicine

## 2022-04-26 DIAGNOSIS — R928 Other abnormal and inconclusive findings on diagnostic imaging of breast: Secondary | ICD-10-CM

## 2022-05-13 ENCOUNTER — Ambulatory Visit
Admission: RE | Admit: 2022-05-13 | Discharge: 2022-05-13 | Disposition: A | Discharge status: Home / Self Care | Payer: 59 | Source: Ambulatory Visit | Attending: Family Medicine | Admitting: Family Medicine

## 2022-05-13 DIAGNOSIS — R928 Other abnormal and inconclusive findings on diagnostic imaging of breast: Secondary | ICD-10-CM | POA: Diagnosis not present

## 2023-02-01 IMAGING — MG MM DIGITAL DIAGNOSTIC UNILAT*R* W/ TOMO W/ CAD
4 series · 4 of 12 positions shown · non-contrast
Comparison: Previous exam(s).

CLINICAL DATA: The patient was called back from screening
mammography due to a right breast asymmetry.

EXAM:
DIGITAL DIAGNOSTIC UNILATERAL RIGHT MAMMOGRAM WITH TOMOSYNTHESIS AND
CAD
TECHNIQUE: Right digital diagnostic mammography and breast tomosynthesis was
performed. The images were evaluated with computer-aided detection.

[R CC synth-2D]
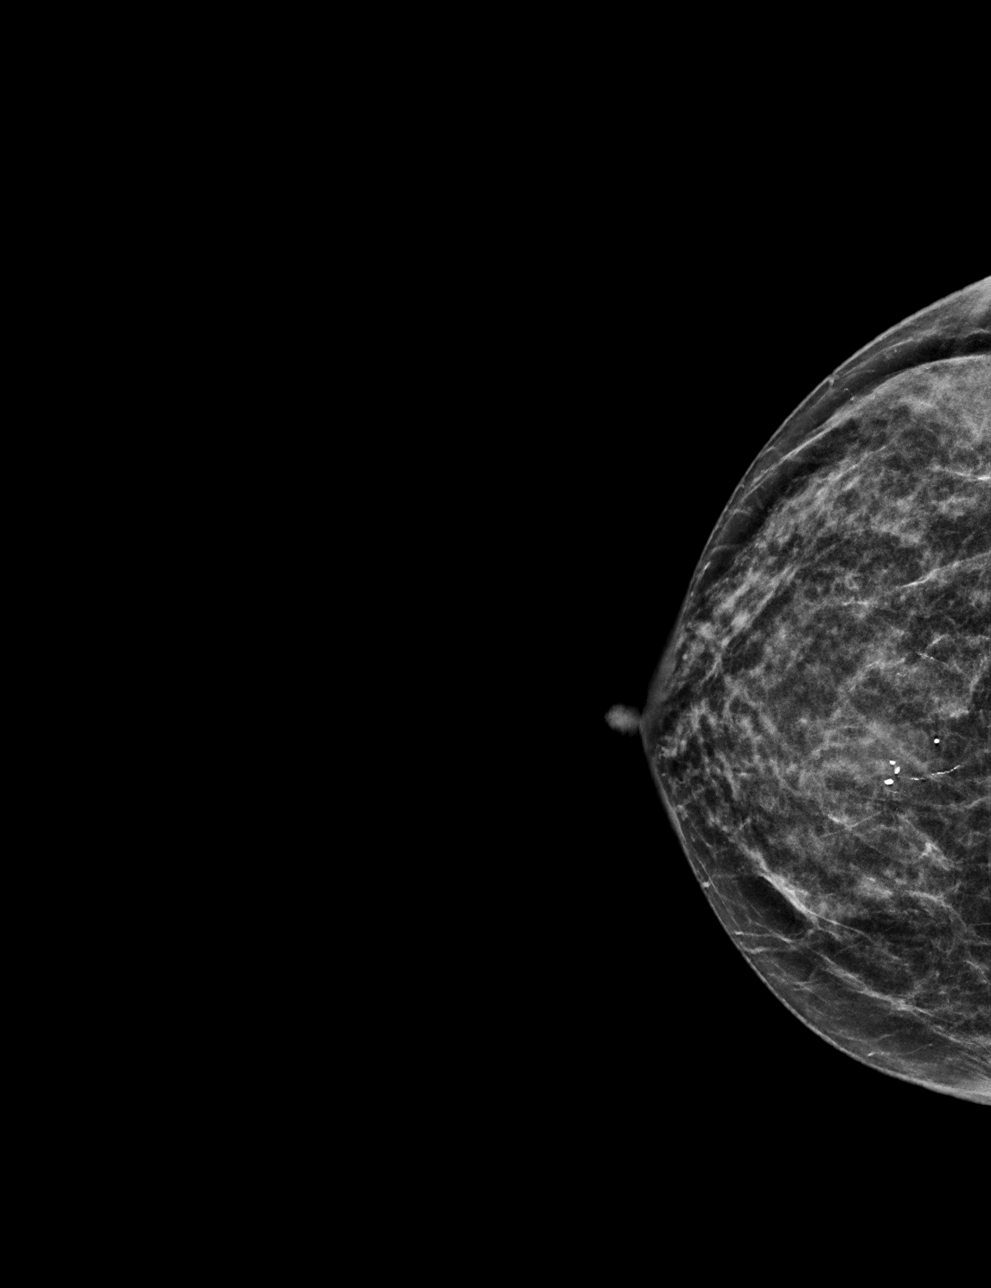

[R MLO synth-2D]
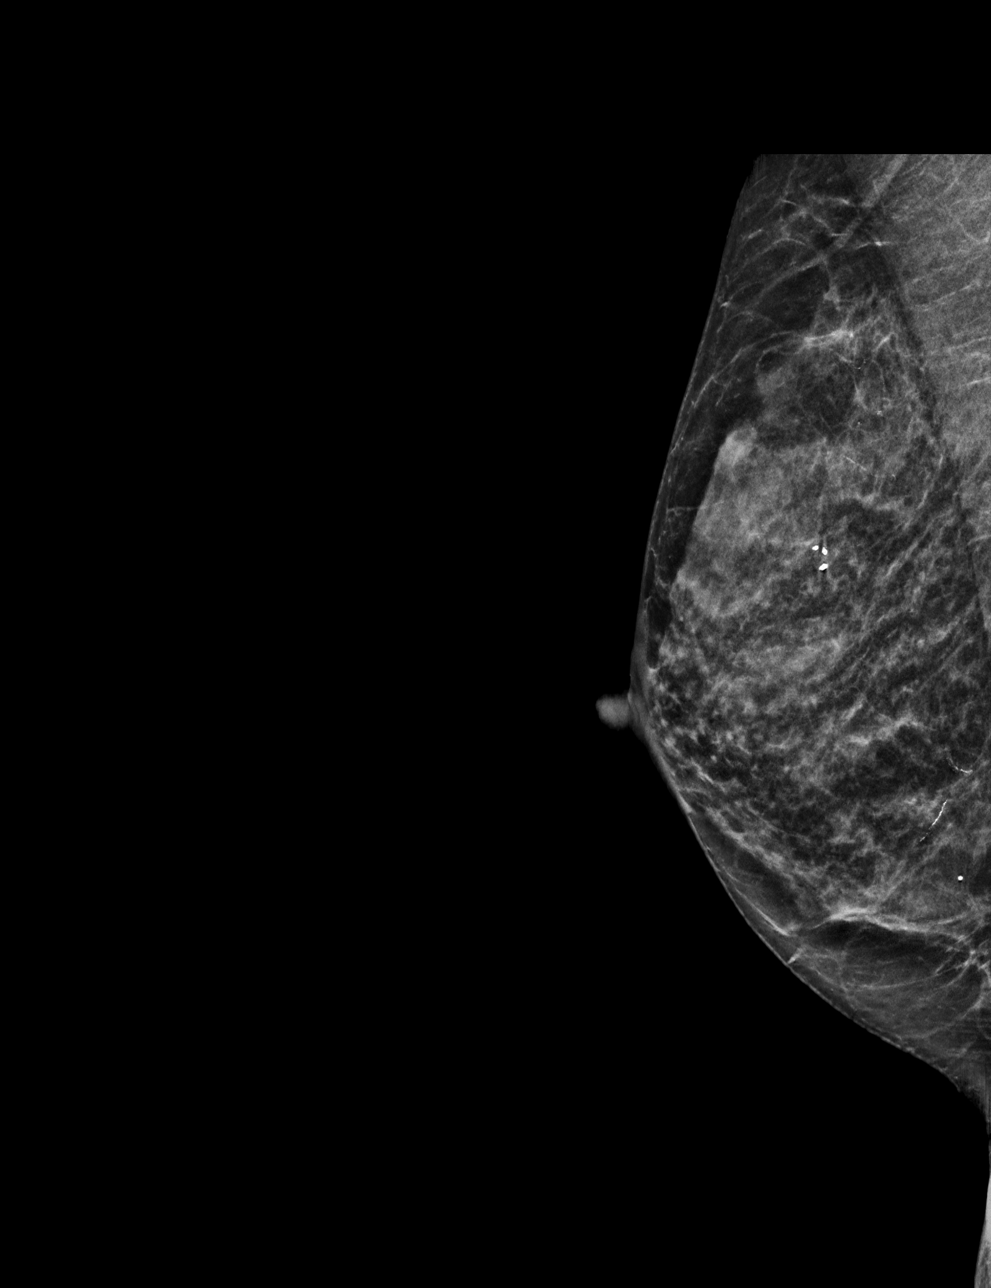

[R CC tomo · tomo slice 25/48.0]
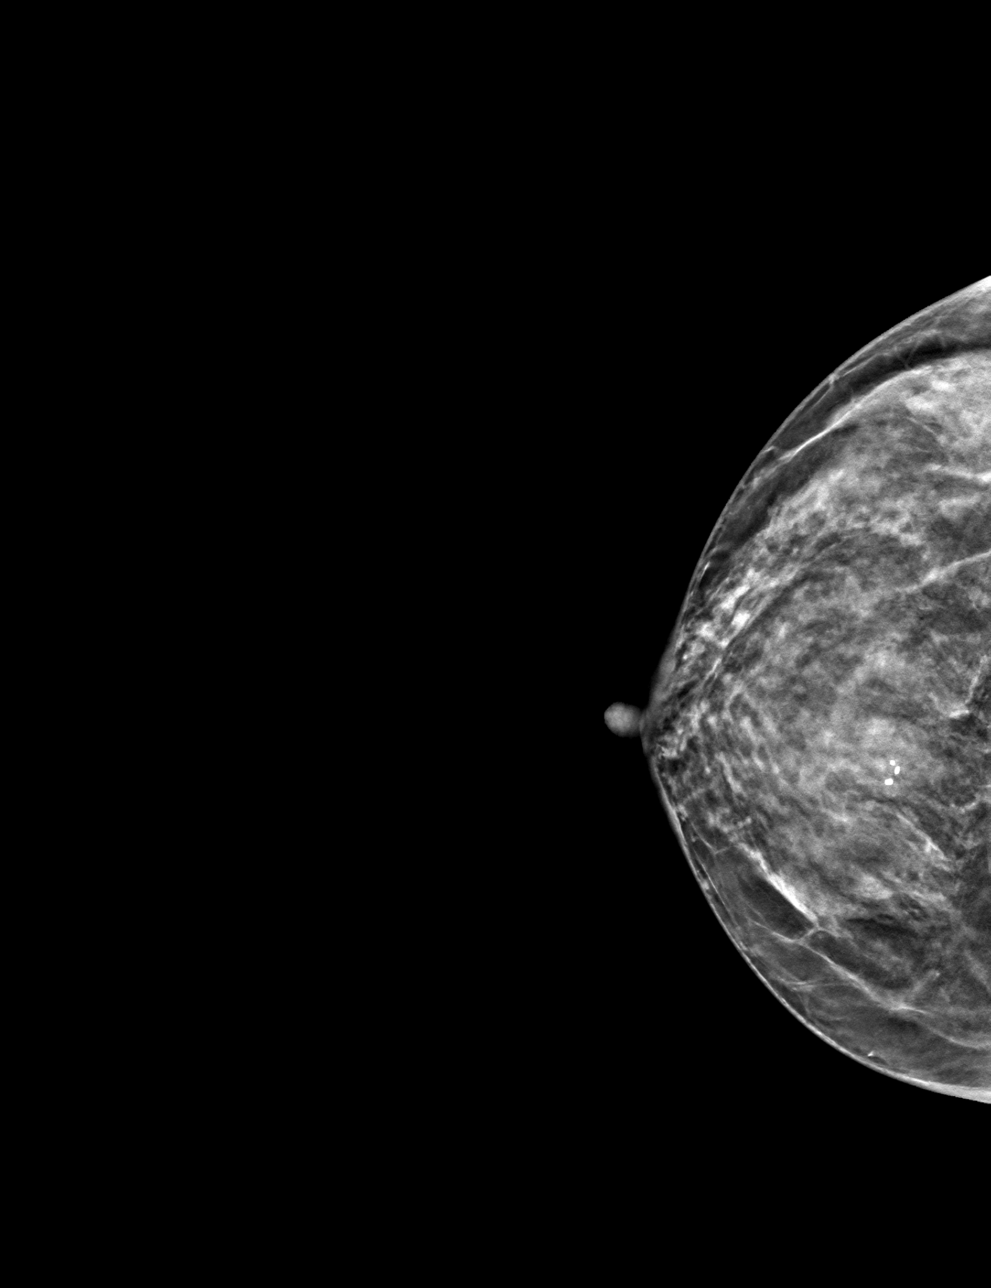

[R MLO tomo · tomo slice 23/44.0]
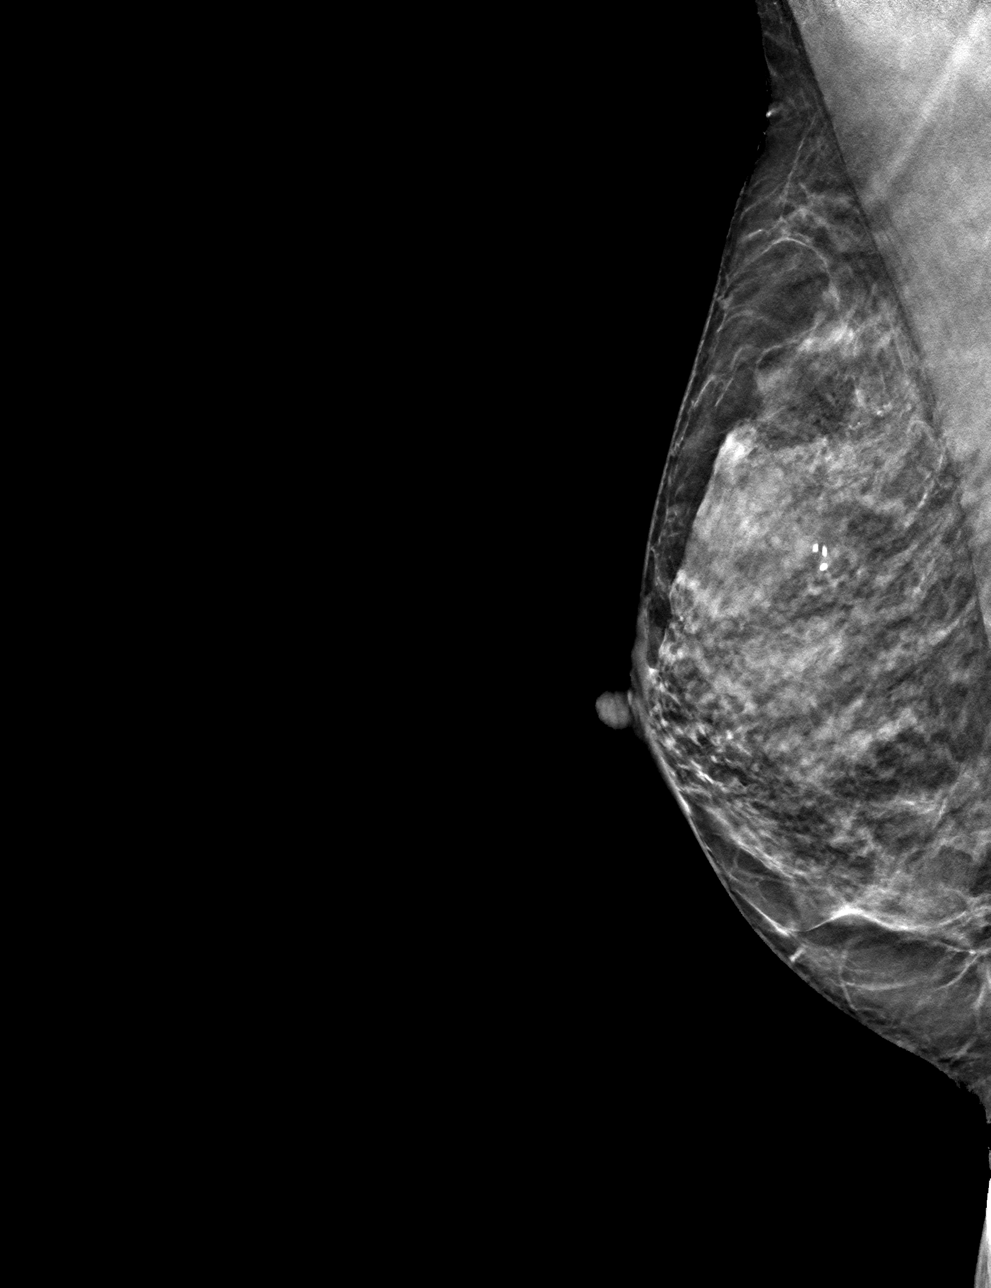

[4 of 12 positions shown; findings below may reference images not displayed]

ACR Breast Density Category c: The breast tissue is heterogeneously
dense, which may obscure small masses.
FINDINGS: The right breast asymmetry resolves on additional imaging. No
mammographic evidence of malignancy.
IMPRESSION: No mammographic evidence of malignancy.

RECOMMENDATION:
Annual screening mammography.

I have discussed the findings and recommendations with the patient.
If applicable, a reminder letter will be sent to the patient
regarding the next appointment.

BI-RADS CATEGORY  1: Negative.
